# Patient Record
Sex: Male | Born: 1965 | Race: White | Hispanic: No | Marital: Single | Smoking: Current every day smoker
Health system: Southern US, Community
[De-identification: ages and names within clinical notes are randomized; demographics above are authoritative.]

## PROBLEM LIST (undated history)

## (undated) DIAGNOSIS — M1990 Unspecified osteoarthritis, unspecified site: Secondary | ICD-10-CM

## (undated) DIAGNOSIS — F32A Depression, unspecified: Secondary | ICD-10-CM

## (undated) DIAGNOSIS — J45909 Unspecified asthma, uncomplicated: Secondary | ICD-10-CM

## (undated) DIAGNOSIS — F329 Major depressive disorder, single episode, unspecified: Secondary | ICD-10-CM

## (undated) HISTORY — PX: NO PAST SURGERIES: SHX2092

## (undated) HISTORY — DX: Unspecified osteoarthritis, unspecified site: M19.90

## (undated) HISTORY — PX: CLEFT PALATE REPAIR: SUR1165

---

## 2009-10-04 ENCOUNTER — Emergency Department (HOSPITAL_COMMUNITY): Admission: EM | Admit: 2009-10-04 | Discharge: 2009-10-04 | Payer: Self-pay | Admitting: Emergency Medicine

## 2010-04-27 LAB — DIFFERENTIAL
Basophils Relative: 0 % (ref 0–1)
Eosinophils Absolute: 0 10*3/uL (ref 0.0–0.7)
Eosinophils Relative: 0 % (ref 0–5)
Lymphocytes Relative: 6 % — ABNORMAL LOW (ref 12–46)
Lymphs Abs: 1 10*3/uL (ref 0.7–4.0)
Monocytes Relative: 3 % (ref 3–12)
Neutro Abs: 14.4 10*3/uL — ABNORMAL HIGH (ref 1.7–7.7)
Neutrophils Relative %: 91 % — ABNORMAL HIGH (ref 43–77)

## 2010-04-27 LAB — CBC
HCT: 44.2 % (ref 39.0–52.0)
Hemoglobin: 15.3 g/dL (ref 13.0–17.0)
MCV: 94.1 fL (ref 78.0–100.0)
Platelets: 264 10*3/uL (ref 150–400)
RBC: 4.69 MIL/uL (ref 4.22–5.81)

## 2010-04-27 LAB — URINALYSIS, ROUTINE W REFLEX MICROSCOPIC
Bilirubin Urine: NEGATIVE
Glucose, UA: NEGATIVE mg/dL
Nitrite: NEGATIVE

## 2010-04-27 LAB — COMPREHENSIVE METABOLIC PANEL
ALT: 19 U/L (ref 0–53)
Albumin: 4.2 g/dL (ref 3.5–5.2)
BUN: 11 mg/dL (ref 6–23)
CO2: 24 mEq/L (ref 19–32)
Chloride: 113 mEq/L — ABNORMAL HIGH (ref 96–112)
Creatinine, Ser: 0.92 mg/dL (ref 0.4–1.5)
Sodium: 143 mEq/L (ref 135–145)
Total Bilirubin: 0.8 mg/dL (ref 0.3–1.2)
Total Protein: 7.1 g/dL (ref 6.0–8.3)

## 2011-04-29 ENCOUNTER — Ambulatory Visit: Payer: Self-pay | Admitting: Physician Assistant

## 2011-04-29 VITALS — BP 118/78 | HR 78 | Temp 98.4°F | Resp 20 | Ht 65.0 in | Wt 145.0 lb

## 2011-04-29 DIAGNOSIS — J329 Chronic sinusitis, unspecified: Secondary | ICD-10-CM

## 2011-04-29 DIAGNOSIS — J019 Acute sinusitis, unspecified: Secondary | ICD-10-CM

## 2011-04-29 DIAGNOSIS — J40 Bronchitis, not specified as acute or chronic: Secondary | ICD-10-CM

## 2011-04-29 DIAGNOSIS — R062 Wheezing: Secondary | ICD-10-CM

## 2011-04-29 DIAGNOSIS — J209 Acute bronchitis, unspecified: Secondary | ICD-10-CM

## 2011-04-29 MED ORDER — GUAIFENESIN ER 1200 MG PO TB12: 1.0000 | ORAL_TABLET | Freq: Two times a day (BID) | ORAL | Status: DC | PRN

## 2011-04-29 MED ORDER — CLARITHROMYCIN ER 500 MG PO TB24: 1000.0000 mg | ORAL_TABLET | Freq: Every day | ORAL | Status: AC

## 2011-04-29 MED ORDER — ALBUTEROL SULFATE HFA 108 (90 BASE) MCG/ACT IN AERS: 2.0000 | INHALATION_SPRAY | RESPIRATORY_TRACT | Status: DC | PRN

## 2011-04-29 MED ORDER — IPRATROPIUM BROMIDE 0.03 % NA SOLN: 2.0000 | Freq: Two times a day (BID) | NASAL | Status: AC

## 2011-04-29 NOTE — Patient Instructions (Signed)
Get lots of rest and drink at least 64 ounces of water daily. 

## 2011-04-29 NOTE — Progress Notes (Signed)
  Subjective:    Patient ID: Devin Chan, male    DOB: 07/13/1965, 46 y.o.   MRN: [id number]  HPI Symptoms for a week.  Nasal congestion, sinus pressure, chest pressure.  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  Lots of dust exposure.Chills, fever.  No HA, ST.  Mucinex and Benadryl with minimal relief.   Review of Systems As above.    Objective:   Physical Exam Vital signs noted. Well-developed, well nourished WM who is awake, alert and oriented, in NAD. HEENT: [place]/AT, PERRL, EOMI.  Sclera and conjunctiva are clear.  EAC are patent, TMs are normal in appearance. Nasal mucosa is pink and moist. OP is clear. Neck: supple, non-tender, no lymphadenopathey, thyromegaly. Heart: RRR, no murmur Lungs: High pitched musical wheezes throughout. Abdomen: normo-active bowel sounds, supple, non-tender, no mass or organomegaly. Extremities: no cyanosis, clubbing or edema. Skin: warm and dry without rash.     Assessment & Plan:  Sinusitis, bronchitis, wheezing.  Biaxin XL, Atrovent NS, OTC Mucinex, albuterol inhaler.  Counseled to reduce and d/c tobacco use. Rest.  Fluids.

## 2011-05-22 ENCOUNTER — Ambulatory Visit: Payer: Self-pay | Admitting: Emergency Medicine

## 2011-05-22 VITALS — BP 130/79 | HR 70 | Temp 98.3°F | Resp 16 | Ht 66.5 in | Wt 147.0 lb

## 2011-05-22 DIAGNOSIS — J329 Chronic sinusitis, unspecified: Secondary | ICD-10-CM

## 2011-05-22 DIAGNOSIS — J019 Acute sinusitis, unspecified: Secondary | ICD-10-CM

## 2011-05-22 DIAGNOSIS — R05 Cough: Secondary | ICD-10-CM

## 2011-05-22 MED ORDER — AMOXICILLIN-POT CLAVULANATE 875-125 MG PO TABS: 1.0000 | ORAL_TABLET | Freq: Two times a day (BID) | ORAL | Status: AC

## 2011-05-22 NOTE — Patient Instructions (Signed)

## 2011-05-22 NOTE — Progress Notes (Signed)
  Subjective:    Patient ID: Devin Chan, male    DOB: 1965/12/13, 46 y.o.   MRN: [id number]  HPI patient was seen here, the go and treated for bronchitis. He was treated with Biaxin. He enters today with about a two-week history of head congestion greenish nasal drainage. He has not had a cough. He has not had any fever or chills.    Review of Systems he has stopped smoking since his last visit here in     Objective:   Physical Exam  Constitutional: He appears well-developed.  HENT:  Right Ear: External ear normal.  Left Ear: External ear normal.       There is some scarring of the right TM. He has had a uvulectomy. His neck is supple. Chest was clear to auscultation and percussion          Assessment & Plan:   Assessment is acute sinusitis. He has nasal spray left ovary is going to use. We'll treat with Augmentin twice a day.

## 2012-04-15 ENCOUNTER — Ambulatory Visit: Payer: Self-pay | Admitting: Family Medicine

## 2012-04-15 VITALS — BP 144/88 | HR 74 | Temp 98.1°F | Resp 16 | Ht 65.0 in | Wt 148.0 lb

## 2012-04-15 DIAGNOSIS — L0201 Cutaneous abscess of face: Secondary | ICD-10-CM

## 2012-04-15 DIAGNOSIS — H00016 Hordeolum externum left eye, unspecified eyelid: Secondary | ICD-10-CM

## 2012-04-15 DIAGNOSIS — L03211 Cellulitis of face: Secondary | ICD-10-CM

## 2012-04-15 DIAGNOSIS — H00019 Hordeolum externum unspecified eye, unspecified eyelid: Secondary | ICD-10-CM

## 2012-04-15 MED ORDER — SULFAMETHOXAZOLE-TRIMETHOPRIM 800-160 MG PO TABS: 1.0000 | ORAL_TABLET | Freq: Two times a day (BID) | ORAL | Status: DC

## 2012-04-15 MED ORDER — SULFAMETHOXAZOLE-TMP DS 800-160 MG PO TABS
1.0000 | ORAL_TABLET | Freq: Once | ORAL | Status: AC
  Administered 2012-04-15: 1 via ORAL

## 2012-04-15 NOTE — Addendum Note (Signed)
Addended by: HOPPER, DAVID H on: 04/15/2012 02:25 PM   Modules accepted: Orders

## 2012-04-15 NOTE — Patient Instructions (Addendum)
Return if in all worse or if not improving.

## 2012-04-15 NOTE — Progress Notes (Signed)
Subjective: Patient has had a rare area coming up on his left lower eyelid for 2 or 3 days. He a lot of little splinters in his hand, and he thought he might have gotten a little with splinter in there. Yesterday he tried to squeeze it and got a little bit of pus and some pain out of it. Today it was worse and he came on in here.  Objective: Left lower lid has a stye of the inner margin, small white pustule. The lid is red, with a little edema to the creases. The itself is mildly injected. His fundi are benign. No evidence of foreign objects.  Assessment: Stye left eye lid  Plan: Using a 23-gauge needle the pustule was unroofed and cultured. A little more pus was expressed. It bled a little bit stopped on its on. Culture was taken.  Bactrim DS twice a day. Gave the first one here. Ocean him to return if in all worse

## 2012-04-18 LAB — WOUND CULTURE

## 2012-04-20 ENCOUNTER — Telehealth: Payer: Self-pay

## 2012-04-20 NOTE — Telephone Encounter (Signed)
He was due for follow up after completing antibiotics. Have called him to remind he will come in if not better.

## 2012-04-20 NOTE — Telephone Encounter (Signed)
Patient states he is returning a missed call he received regarding his eye biopsy result. Patient would like a call back @ [phone]. Thanks!

## 2012-08-28 ENCOUNTER — Ambulatory Visit: Payer: Self-pay | Admitting: Emergency Medicine

## 2012-08-28 VITALS — BP 122/82 | HR 80 | Temp 98.0°F | Resp 18 | Ht 66.5 in | Wt 154.0 lb

## 2012-08-28 DIAGNOSIS — L0291 Cutaneous abscess, unspecified: Secondary | ICD-10-CM

## 2012-08-28 DIAGNOSIS — H00016 Hordeolum externum left eye, unspecified eyelid: Secondary | ICD-10-CM

## 2012-08-28 MED ORDER — SULFAMETHOXAZOLE-TRIMETHOPRIM 800-160 MG PO TABS: 1.0000 | ORAL_TABLET | Freq: Two times a day (BID) | ORAL | Status: DC

## 2012-08-28 NOTE — Patient Instructions (Signed)
Abscess An abscess is an infected area that contains a collection of pus and debris.It can occur in almost any part of the body. An abscess is also known as a furuncle or boil. CAUSES  An abscess occurs when tissue gets infected. This can occur from blockage of oil or sweat glands, infection of hair follicles, or a minor injury to the skin. As the body tries to fight the infection, pus collects in the area and creates pressure under the skin. This pressure causes pain. People with weakened immune systems have difficulty fighting infections and get certain abscesses more often.  SYMPTOMS Usually an abscess develops on the skin and becomes a painful mass that is red, warm, and tender. If the abscess forms under the skin, you may feel a moveable soft area under the skin. Some abscesses break open (rupture) on their own, but most will continue to get worse without care. The infection can spread deeper into the body and eventually into the bloodstream, causing you to feel ill.  DIAGNOSIS  Your caregiver will take your medical history and perform a physical exam. A sample of fluid may also be taken from the abscess to determine what is causing your infection. TREATMENT  Your caregiver may prescribe antibiotic medicines to fight the infection. However, taking antibiotics alone usually does not cure an abscess. Your caregiver may need to make a small cut (incision) in the abscess to drain the pus. In some cases, gauze is packed into the abscess to reduce pain and to continue draining the area. HOME CARE INSTRUCTIONS   Only take over-the-counter or prescription medicines for pain, discomfort, or fever as directed by your caregiver.  If you were prescribed antibiotics, take them as directed. Finish them even if you start to feel better.  If gauze is used, follow your caregiver's directions for changing the gauze.  To avoid spreading the infection:  Keep your draining abscess covered with a  bandage.  Wash your hands well.  Do not share personal care items, towels, or whirlpools with others.  Avoid skin contact with others.  Keep your skin and clothes clean around the abscess.  Keep all follow-up appointments as directed by your caregiver. SEEK MEDICAL CARE IF:   You have increased pain, swelling, redness, fluid drainage, or bleeding.  You have muscle aches, chills, or a general ill feeling.  You have a fever. MAKE SURE YOU:   Understand these instructions.  Will watch your condition.  Will get help right away if you are not doing well or get worse. Document Released: 11/07/2004 Document Revised: 07/30/2011 Document Reviewed: 04/12/2011 [employer] Patient Information 2014 [employer].  

## 2012-08-28 NOTE — Progress Notes (Signed)
[facility] [address], [place] [address] [phone]  Date:  08/28/2012   Name:  Devin Chan   DOB:  [birth year]   MRN:  [id number]  PCP:  Devin Garland, MD    Chief Complaint: Neck Pain   History of Present Illness:  Devin Chan is a 47 y.o. very pleasant male patient who presents with the following:  Lesion on the back of his neck that is painful and tender.  No improvement with over the counter medications or other home remedies. Denies other complaint or health concern today.   There are no active problems to display for this patient.   Past Medical History  Diagnosis Date  . Arthritis     Past Surgical History  Procedure Laterality Date  . Cleft palate repair  age 68    [facility]    History  Substance Use Topics  . Smoking status: Former Smoker -- 1.00 packs/day for 30 years    Types: Cigarettes    Quit date: 05/01/2011  . Smokeless tobacco: Never Used     Comment: quit x 6 months at age 54  . Alcohol Use: 6.0 oz/week    12 drink(s) per week    Family History  Problem Relation Age of Onset  . Drug abuse Father   . Stroke Father   . Hypertension Father     No Known Allergies  Medication list has been reviewed and updated.  Current Outpatient Prescriptions on File Prior to Visit  Medication Sig Dispense Refill  . Multiple Vitamins-Minerals (MULTIVITAMIN WITH MINERALS) tablet Take 1 tablet by mouth daily.      . Guaifenesin (MUCINEX MAXIMUM STRENGTH) 1200 MG TB12 Take 1 tablet (1,200 mg total) by mouth every 12 (twelve) hours as needed.  14 tablet  1  . sulfamethoxazole-trimethoprim (BACTRIM DS,SEPTRA DS) 800-160 MG per tablet Take 1 tablet by mouth 2 (two) times daily.  14 tablet  0   No current facility-administered medications on file prior to visit.    Review of Systems:  As per HPI, otherwise negative.    Physical Examination: Filed Vitals:   08/28/12 1436  BP: 122/82  Pulse: 80  Temp: 98 F (36.7 C)   Resp: 18   Filed Vitals:   08/28/12 1436  Height: 5' 6.5" (1.689 m)  Weight: 154 lb (69.854 kg)   Body mass index is 24.49 kg/(m^2). Ideal Body Weight: Weight in (lb) to have BMI = 25: 156.9   GEN: WDWN, NAD, Non-toxic, Alert & Oriented x 3 HEENT: Atraumatic, Normocephalic.  Ears and Nose: No external deformity. EXTR: No clubbing/cyanosis/edema NEURO: Normal gait.  PSYCH: Normally interactive. Conversant. Not depressed or anxious appearing.  Calm demeanor.  Skin:  Abscess neck.  Assessment and Plan: Abscess Septra Local heat   Signed,  Phillips Odor, MD

## 2012-11-10 ENCOUNTER — Ambulatory Visit: Payer: Self-pay | Admitting: Family Medicine

## 2012-11-10 VITALS — BP 100/70 | HR 68 | Temp 97.7°F | Resp 16 | Ht 66.0 in | Wt 158.0 lb

## 2012-11-10 DIAGNOSIS — J329 Chronic sinusitis, unspecified: Secondary | ICD-10-CM

## 2012-11-10 MED ORDER — AZITHROMYCIN 250 MG PO TABS: ORAL_TABLET | ORAL | Status: DC

## 2012-11-10 NOTE — Progress Notes (Signed)
  Subjective:    Patient ID: Christy Gentles, male    DOB: Sep 30, 1965, 47 y.o.   MRN: [id number]  HPI 47 yo male with complaint of sinus congestion and pressure with thick nasal discharge.  Onset approximately two weeks ago, improved mildly, the worsened.  History or similar sinus infections in past.  Has not taken anything since onset to alleviate symptoms.  Pain/pressure worse when leaning forward.  Positive post nasal drip as well. Past Medical History  Diagnosis Date  . Arthritis    Past Surgical History  Procedure Laterality Date  . 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  HENT: Positive for congestion, rhinorrhea and postnasal drip.   Eyes: Negative for discharge and redness.  Respiratory: Negative for cough, shortness of breath and wheezing.   Cardiovascular: Negative for chest pain.  Neurological: Positive for headaches. Negative for weakness and numbness.       Objective:   Physical Exam Blood pressure 100/70, pulse 68, temperature 97.7 F (36.5 C), temperature source Oral, resp. rate 16, height 5\' 6"  (1.676 m), weight 158 lb (71.668 kg), SpO2 99.00%. Body mass index is 25.51 kg/(m^2). Well-developed, well nourished male who is awake, alert and oriented, in NAD. HEENT: [place]/AT, PERRL, EOMI.  Sclera and conjunctiva are clear.  EAC are patent, TMs are normal in appearance. Nasal mucosa is edematoust. OP is with mild cobblestoning, mild maxillary sinus tenderness. Neck: supple, non-tender, no lymphadenopathy, thyromegaly. Heart: RRR, no murmur Lungs: normal effort, CTA Abdomen: normo-active bowel sounds, supple, non-tender, no mass or organomegaly. Extremities: no cyanosis, clubbing or edema. Skin: warm and dry without rash. Psychologic: good mood and appropriate affect, normal speech and behavior.      Assessment & Plan:  Sinusitis - approximately 14 days in duration, will treat  with Z-pack and OTC decongestants.  Return if symptoms worsen.

## 2012-11-10 NOTE — Patient Instructions (Signed)

## 2013-12-29 ENCOUNTER — Ambulatory Visit (INDEPENDENT_AMBULATORY_CARE_PROVIDER_SITE_OTHER): Payer: Self-pay

## 2013-12-29 ENCOUNTER — Ambulatory Visit (INDEPENDENT_AMBULATORY_CARE_PROVIDER_SITE_OTHER): Payer: Self-pay | Admitting: Family Medicine

## 2013-12-29 VITALS — BP 166/96 | HR 82 | Temp 97.9°F | Resp 16 | Ht 65.75 in | Wt 155.2 lb

## 2013-12-29 DIAGNOSIS — S62609B Fracture of unspecified phalanx of unspecified finger, initial encounter for open fracture: Secondary | ICD-10-CM

## 2013-12-29 DIAGNOSIS — M79645 Pain in left finger(s): Secondary | ICD-10-CM

## 2013-12-29 DIAGNOSIS — S62607B Fracture of unspecified phalanx of left little finger, initial encounter for open fracture: Secondary | ICD-10-CM

## 2013-12-29 MED ORDER — CEPHALEXIN 500 MG PO CAPS: 500.0000 mg | ORAL_CAPSULE | Freq: Three times a day (TID) | ORAL | Status: DC

## 2013-12-29 NOTE — Progress Notes (Signed)
Patient ID: Devin Chan MRN: [id number], DOB: September 13, 1965, 48 y.o. Date of Encounter: 12/29/2013, 6:00 PM  This chart was scribed for Dr. Elvina SidleKurt Margit Batte, MD by Devin Chan, Medical Scribe. This patient was seen in Room 12 and the patient's care was started at 5:45 PM.  Primary Physician: Devin GarlandMAYANS,LAURA, MD  Chief Complaint:  Chief Complaint  Patient presents with  . Hand Pain    Left pinky finger, X 6-7 days ago     HPI: 48 y.o. year old male with history below presents with left pinky finger pain for 6-7 days. Pt states that he crushed the finger while working. Pt notes some bruising and swelling to the finger close to the nail bed. He also notes that it feels warm to the touch. Pt states he continued working after the accident. He states that he has been icing it with increased throbbing pain. Has also soaked the finger in warm water with minimal relief. He reports he does not have complete full ROM. He denies any fever, chills, nausea or vomiting.   Pt is self employed and works with [employer]  Past Medical History  Diagnosis Date  . Arthritis      Home Meds: Prior to Admission medications   Medication Sig Start Date End Date Taking? Authorizing Provider  aspirin 81 MG tablet Take 81 mg by mouth daily.   Yes Historical Provider, MD  azithromycin (ZITHROMAX) 250 MG tablet Take 2 tabs PO x 1 dose, then 1 tab PO QD x 4 days 11/10/12   Tarry Kosodd M McGrath, MD  Guaifenesin [facility](MUCINEX MAXIMUM STRENGTH) 1200 MG TB12 Take 1 tablet (1,200 mg total) by mouth every 12 (twelve) hours as needed. 04/29/11   [name], PA-C  Multiple Vitamins-Minerals (MULTIVITAMIN WITH MINERALS) tablet Take 1 tablet by mouth daily.   Yes Historical Provider, MD  sulfamethoxazole-trimethoprim (BACTRIM DS,SEPTRA DS) 800-160 MG per tablet Take 1 tablet by mouth 2 (two) times daily. 08/28/12   Devin DaneJeffery S Anderson, MD    Allergies: No Known Allergies  History   Social History  . Marital Status:  Single    Spouse Name: N/A    Number of Children: N/A  . Years of Education: N/A   Occupational History  . Not on file.   Social History Main Topics  . Smoking status: Former Smoker -- 1.00 packs/day for 30 years    Types: Cigarettes    Quit date: 05/01/2011  . Smokeless tobacco: Never Used     Comment: quit x 6 months at age 48  . Alcohol Use: 6.0 oz/week    12 drink(s) per week  . Drug Use: Yes    Special: Marijuana  . Sexual Activity: Yes   Other Topics Concern  . Not on file   Social History Narrative     Review of Systems: Constitutional: negative for chills, fever, night sweats, weight changes, or fatigue  HEENT: negative for vision changes, hearing loss, congestion, rhinorrhea, ST, epistaxis, or sinus pressure Cardiovascular: negative for chest pain or palpitations Respiratory: negative for hemoptysis, wheezing, shortness of breath, or cough Abdominal: negative for abdominal pain, nausea, vomiting, diarrhea, or constipation Dermatological: negative for rash. Positive for bruising and warmth to left pinky finger Neurologic: negative for headache, dizziness, or syncope All other systems reviewed and are otherwise negative with the exception to those above and in the HPI.   Physical Exam: Blood pressure 166/96, pulse 82, temperature 97.9 F (36.6 C), temperature source Oral, resp. rate 16, height 5' 5.75" (  1.67 m), weight 155 lb 3.2 oz (70.398 kg), SpO2 99 %., Body mass index is 25.24 kg/(m^2). General: Well developed, well nourished, in no acute distress. Head: Normocephalic, atraumatic, eyes without discharge, sclera non-icteric, nares are without discharge. Bilateral auditory canals clear, TM's are without perforation, pearly grey and translucent with reflective cone of light bilaterally. Oral cavity moist, posterior pharynx without exudate, erythema, peritonsillar abscess, or post nasal drip.  Neck: Supple. No thyromegaly. Full ROM. No lymphadenopathy. Lungs: Clear  bilaterally to auscultation without wheezes, rales, or rhonchi. Breathing is unlabored. Heart: RRR with S1 S2. No murmurs, rubs, or gallops appreciated. Abdomen: Soft, non-tender, non-distended with normoactive bowel sounds. No hepatomegaly. No rebound/guarding. No obvious abdominal masses. Msk:  Strength and tone normal for age. Disruption of the cuticle of left 5th finger with palmar angulation. Inability to flex and fully extend Extremities/Skin: Warm and dry. No clubbing or cyanosis. No edema. No rashes or suspicious lesions. Neuro: Alert and oriented X 3. Moves all extremities spontaneously. Gait is normal. CNII-XII grossly in tact. Psych:  Responds to questions appropriately with a normal affect.   Labs:  Imaging: [facility] reading (PRIMARY) by  Dr. Milus GlazierLauenstein. Positive for displaced distal fragment of left 5th distal phalanx   ASSESSMENT AND PLAN:  [age] y.o. year old male with   1. Finger pain, left   2. Finger fracture, left, open, initial encounter    Meds ordered this encounter  Medications  . cephALEXin (KEFLEX) 500 MG capsule    Sig: Take 1 capsule (500 mg total) by mouth 3 (three) times daily.    Dispense:  21 capsule    Refill:  0   Orthopedic referral Soft splint fashioned with buddy taping  I personally performed the services described in this documentation, which was scribed in my presence. The recorded information has been reviewed and is accurate.  Signed, [name], MD 12/29/2013 6:00 PM

## 2013-12-29 NOTE — Patient Instructions (Signed)
We will refer you to a specialist tomorrow

## 2014-01-02 ENCOUNTER — Inpatient Hospital Stay (HOSPITAL_COMMUNITY)
Admission: EM | Admit: 2014-01-02 | Discharge: 2014-01-03 | DRG: 370 | Disposition: A | Discharge status: Home / Self Care | Payer: Self-pay | Attending: Internal Medicine | Admitting: Internal Medicine

## 2014-01-02 ENCOUNTER — Emergency Department (HOSPITAL_COMMUNITY): Payer: Self-pay

## 2014-01-02 ENCOUNTER — Encounter (HOSPITAL_COMMUNITY): Payer: Self-pay | Admitting: Emergency Medicine

## 2014-01-02 DIAGNOSIS — S62637D Displaced fracture of distal phalanx of left little finger, subsequent encounter for fracture with routine healing: Secondary | ICD-10-CM

## 2014-01-02 DIAGNOSIS — K2970 Gastritis, unspecified, without bleeding: Secondary | ICD-10-CM | POA: Diagnosis present

## 2014-01-02 DIAGNOSIS — F1010 Alcohol abuse, uncomplicated: Secondary | ICD-10-CM

## 2014-01-02 DIAGNOSIS — F1290 Cannabis use, unspecified, uncomplicated: Secondary | ICD-10-CM | POA: Diagnosis present

## 2014-01-02 DIAGNOSIS — K920 Hematemesis: Secondary | ICD-10-CM | POA: Diagnosis present

## 2014-01-02 DIAGNOSIS — Z87891 Personal history of nicotine dependence: Secondary | ICD-10-CM

## 2014-01-02 DIAGNOSIS — K210 Gastro-esophageal reflux disease with esophagitis: Secondary | ICD-10-CM | POA: Diagnosis present

## 2014-01-02 DIAGNOSIS — F1020 Alcohol dependence, uncomplicated: Secondary | ICD-10-CM | POA: Diagnosis present

## 2014-01-02 DIAGNOSIS — K922 Gastrointestinal hemorrhage, unspecified: Secondary | ICD-10-CM

## 2014-01-02 DIAGNOSIS — R1013 Epigastric pain: Secondary | ICD-10-CM | POA: Insufficient documentation

## 2014-01-02 DIAGNOSIS — K226 Gastro-esophageal laceration-hemorrhage syndrome: Principal | ICD-10-CM | POA: Diagnosis present

## 2014-01-02 HISTORY — DX: Major depressive disorder, single episode, unspecified: F32.9

## 2014-01-02 HISTORY — DX: Unspecified asthma, uncomplicated: J45.909

## 2014-01-02 HISTORY — DX: Depression, unspecified: F32.A

## 2014-01-02 LAB — COMPREHENSIVE METABOLIC PANEL
ALT: 27 U/L (ref 0–53)
ANION GAP: 22 — AB (ref 5–15)
AST: 27 U/L (ref 0–37)
Albumin: 4.5 g/dL (ref 3.5–5.2)
Alkaline Phosphatase: 55 U/L (ref 39–117)
BILIRUBIN TOTAL: 0.6 mg/dL (ref 0.3–1.2)
BUN: 13 mg/dL (ref 6–23)
CALCIUM: 9.2 mg/dL (ref 8.4–10.5)
CO2: 19 mEq/L (ref 19–32)
Chloride: 95 mEq/L — ABNORMAL LOW (ref 96–112)
Creatinine, Ser: 1 mg/dL (ref 0.50–1.35)
GFR calc Af Amer: >90 mL/min (ref >90)
GFR calc non Af Amer: 87 mL/min — ABNORMAL LOW (ref >90)
Glucose, Bld: 107 mg/dL — ABNORMAL HIGH (ref 70–99)
Potassium: 4.4 mEq/L (ref 3.7–5.3)
Sodium: 136 mEq/L — ABNORMAL LOW (ref 137–147)
TOTAL PROTEIN: 8 g/dL (ref 6.0–8.3)

## 2014-01-02 LAB — TYPE AND SCREEN
ABO/RH(D): O NEG
Antibody Screen: NEGATIVE

## 2014-01-02 LAB — RAPID URINE DRUG SCREEN, HOSP PERFORMED
Amphetamines: NOT DETECTED
BARBITURATES: NOT DETECTED
Benzodiazepines: NOT DETECTED
Cocaine: NOT DETECTED
Opiates: POSITIVE — AB
Tetrahydrocannabinol: POSITIVE — AB

## 2014-01-02 LAB — CBC
HEMATOCRIT: 41 % (ref 39.0–52.0)
Hemoglobin: 14 g/dL (ref 13.0–17.0)
MCH: 30.1 pg (ref 26.0–34.0)
MCHC: 34.1 g/dL (ref 30.0–36.0)
MCV: 88.2 fL (ref 78.0–100.0)
PLATELETS: 303 10*3/uL (ref 150–400)
RBC: 4.65 MIL/uL (ref 4.22–5.81)
RDW: 13.1 % (ref 11.5–15.5)
WBC: 10.5 10*3/uL (ref 4.0–10.5)

## 2014-01-02 LAB — CBC WITH DIFFERENTIAL/PLATELET
Basophils Absolute: 0 10*3/uL (ref 0.0–0.1)
Basophils Relative: 0 % (ref 0–1)
EOS ABS: 0 10*3/uL (ref 0.0–0.7)
EOS PCT: 0 % (ref 0–5)
HEMATOCRIT: 45.7 % (ref 39.0–52.0)
Hemoglobin: 16 g/dL (ref 13.0–17.0)
LYMPHS PCT: 6 % — AB (ref 12–46)
Lymphs Abs: 0.8 10*3/uL (ref 0.7–4.0)
MCH: 31.4 pg (ref 26.0–34.0)
MCHC: 35 g/dL (ref 30.0–36.0)
MCV: 89.8 fL (ref 78.0–100.0)
MONO ABS: 0.3 10*3/uL (ref 0.1–1.0)
Monocytes Relative: 2 % — ABNORMAL LOW (ref 3–12)
NEUTROS ABS: 12.7 10*3/uL — AB (ref 1.7–7.7)
Neutrophils Relative %: 92 % — ABNORMAL HIGH (ref 43–77)
PLATELETS: 348 10*3/uL (ref 150–400)
RBC: 5.09 MIL/uL (ref 4.22–5.81)
RDW: 12.9 % (ref 11.5–15.5)
WBC: 13.8 10*3/uL — AB (ref 4.0–10.5)

## 2014-01-02 LAB — PROTIME-INR
INR: 1.08 (ref 0.00–1.49)
Prothrombin Time: 14.1 seconds (ref 11.6–15.2)

## 2014-01-02 LAB — OCCULT BLOOD X 1 CARD TO LAB, STOOL: FECAL OCCULT BLD: NEGATIVE

## 2014-01-02 LAB — LIPASE, BLOOD: LIPASE: 24 U/L (ref 11–59)

## 2014-01-02 LAB — ABO/RH: ABO/RH(D): O NEG

## 2014-01-02 MED ORDER — ONDANSETRON HCL 4 MG/2ML IJ SOLN: 4.0000 mg | Freq: Four times a day (QID) | INTRAMUSCULAR | Status: DC | PRN

## 2014-01-02 MED ORDER — ONDANSETRON HCL 4 MG/2ML IJ SOLN
4.0000 mg | Freq: Once | INTRAMUSCULAR | Status: AC
  Administered 2014-01-02: 4 mg via INTRAVENOUS
  Filled 2014-01-02: qty 2

## 2014-01-02 MED ORDER — CEPHALEXIN 500 MG PO CAPS
500.0000 mg | ORAL_CAPSULE | Freq: Three times a day (TID) | ORAL | Status: DC
  Administered 2014-01-02 – 2014-01-03 (×4): 500 mg via ORAL
  Filled 2014-01-02 (×5): qty 1

## 2014-01-02 MED ORDER — SUCRALFATE 1 GM/10ML PO SUSP
1.0000 g | Freq: Four times a day (QID) | ORAL | Status: DC
  Administered 2014-01-02 – 2014-01-03 (×3): 1 g via ORAL
  Filled 2014-01-02 (×7): qty 10

## 2014-01-02 MED ORDER — ONDANSETRON HCL 4 MG PO TABS: 4.0000 mg | ORAL_TABLET | Freq: Four times a day (QID) | ORAL | Status: DC | PRN

## 2014-01-02 MED ORDER — LORAZEPAM 2 MG/ML IJ SOLN: 1.0000 mg | Freq: Four times a day (QID) | INTRAMUSCULAR | Status: DC | PRN

## 2014-01-02 MED ORDER — LORAZEPAM 1 MG PO TABS: 1.0000 mg | ORAL_TABLET | Freq: Four times a day (QID) | ORAL | Status: DC | PRN

## 2014-01-02 MED ORDER — HYDROMORPHONE HCL 1 MG/ML IJ SOLN
1.0000 mg | Freq: Once | INTRAMUSCULAR | Status: AC
  Administered 2014-01-02: 1 mg via INTRAVENOUS
  Filled 2014-01-02: qty 1

## 2014-01-02 MED ORDER — VITAMIN B-1 100 MG PO TABS
100.0000 mg | ORAL_TABLET | Freq: Every day | ORAL | Status: DC
  Administered 2014-01-02 – 2014-01-03 (×2): 100 mg via ORAL
  Filled 2014-01-02 (×2): qty 1

## 2014-01-02 MED ORDER — SUCRALFATE 1 G PO TABS
1.0000 g | ORAL_TABLET | Freq: Once | ORAL | Status: AC
  Administered 2014-01-02: 1 g via ORAL
  Filled 2014-01-02: qty 1

## 2014-01-02 MED ORDER — MORPHINE SULFATE 4 MG/ML IJ SOLN
4.0000 mg | Freq: Once | INTRAMUSCULAR | Status: AC
  Administered 2014-01-02: 4 mg via INTRAVENOUS
  Filled 2014-01-02: qty 1

## 2014-01-02 MED ORDER — SODIUM CHLORIDE 0.9 % IV SOLN
80.0000 mg | Freq: Once | INTRAVENOUS | Status: AC
  Administered 2014-01-02: 80 mg via INTRAVENOUS
  Filled 2014-01-02 (×2): qty 80

## 2014-01-02 MED ORDER — FOLIC ACID 1 MG PO TABS
1.0000 mg | ORAL_TABLET | Freq: Every day | ORAL | Status: DC
  Administered 2014-01-02 – 2014-01-03 (×2): 1 mg via ORAL
  Filled 2014-01-02 (×2): qty 1

## 2014-01-02 MED ORDER — SODIUM CHLORIDE 0.9 % IV SOLN
8.0000 mg/h | INTRAVENOUS | Status: DC
  Administered 2014-01-02 – 2014-01-03 (×2): 8 mg/h via INTRAVENOUS
  Filled 2014-01-02 (×7): qty 80

## 2014-01-02 MED ORDER — THIAMINE HCL 100 MG/ML IJ SOLN
100.0000 mg | Freq: Every day | INTRAMUSCULAR | Status: DC
  Filled 2014-01-02 (×2): qty 1

## 2014-01-02 MED ORDER — SODIUM CHLORIDE 0.9 % IV BOLUS (SEPSIS)
1000.0000 mL | Freq: Once | INTRAVENOUS | Status: AC
  Administered 2014-01-02: 1000 mL via INTRAVENOUS

## 2014-01-02 MED ORDER — SODIUM CHLORIDE 0.9 % IV SOLN
INTRAVENOUS | Status: AC
Start: 2014-01-02 — End: 2014-01-03
  Administered 2014-01-02 – 2014-01-03 (×2): via INTRAVENOUS

## 2014-01-02 MED ORDER — ADULT MULTIVITAMIN W/MINERALS CH
1.0000 | ORAL_TABLET | Freq: Every day | ORAL | Status: DC
  Administered 2014-01-02 – 2014-01-03 (×2): 1 via ORAL
  Filled 2014-01-02 (×2): qty 1

## 2014-01-02 NOTE — ED Provider Notes (Addendum)
CSN: [id number]     Arrival date & time 01/02/14  0919 History   First MD Initiated Contact with Patient 01/02/14 [phone]     Chief Complaint  Patient presents with  . Abdominal Pain     (Consider location/radiation/quality/duration/timing/severity/associated sxs/prior Treatment) HPI  Pt presenting with c/o abdominal pain and vomiting.  Pt states he vomited multiple times bright red blood.  No melena or blood in stools.  C/o epigastric abdominal pain.  Pt endorses drinking etoh last night.  No fever/chills.  No hx of prior GI bleed. Pain is aching and sharp.  He has not had any treatment prior to arrival.  There are no other associated systemic symptoms, there are no other alleviating or modifying factors.   Past Medical History  Diagnosis Date  . Arthritis   . Asthma   . Depression    Past Surgical History  Procedure Laterality Date  . 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 No past surgeries     Family History  Problem Relation Age of Onset  . Drug abuse Father   . Stroke Father   . Hypertension Father    History  Substance Use Topics  . Smoking status: Former Smoker -- 1.00 packs/day for 30 years    Types: Cigarettes    Quit date: 05/01/2011  . Smokeless tobacco: Never Used     Comment: quit x 6 months at age 48  . Alcohol Use: 16.2 oz/week    12 Not specified, 15 Cans of beer per week    Review of Systems  ROS reviewed and all otherwise negative except for mentioned in HPI    Allergies  Review of patient's allergies indicates no known allergies.  Home Medications   Prior to Admission medications   Medication Sig Start Date End Date Taking? Authorizing Provider  aspirin 81 MG tablet Take 81 mg by mouth daily.   Yes Historical Provider, MD  cephALEXin (KEFLEX) 500 MG capsule Take 1 capsule (500 mg total) by mouth 3 (three) times daily. 12/29/13  Yes Elvina SidleKurt Lauenstein, MD  pantoprazole (PROTONIX) 40 MG tablet Take 1 tablet (40 mg total) by mouth daily. 01/03/14    Tasrif Ahmed, MD   BP 124/71 mmHg  Pulse 75  Temp(Src) 98.8 F (37.1 C) (Oral)  Resp 18  Ht 5\' 6"  (1.676 m)  Wt 154 lb 4.8 oz (69.99 kg)  BMI 24.92 kg/m2  SpO2 96%  Vitals reviewed Physical Exam  Physical Examination: General appearance - alert, well appearing, and in no distress Mental status - alert, oriented to person, place, and time Eyes - no conjunctival injection, no scleral icterus Mouth - mucous membranes moist, pharynx normal without lesions Chest - clear to auscultation, no wheezes, rales or rhonchi, symmetric air entry Heart - normal rate, regular rhythm, normal S1, S2, no murmurs, rubs, clicks or gallops Abdomen - soft, ttp in epigastric region, no gaurding or rebound, nondistended, no masses or organomegaly, nabs Extremities - peripheral pulses normal, no pedal edema, no clubbing or cyanosis Skin - normal coloration and turgor, no rashes  ED Course  Procedures (including critical care time)  2:30 PM d/w Dr. Juanda ChanceBrodie, GI she agrees with PPI and admission to medical service  2:36 PM d/w internal medicine service- pt to be admitted to med/surg bed.   CRITICAL CARE Performed by: Ethelda ChickLINKER,MARTHA K Total critical care time: 35 Critical care time was exclusive of separately billable procedures and treating other patients. Critical care was necessary to treat  or prevent imminent or life-threatening deterioration. Critical care was time spent personally by me on the following activities: development of treatment plan with patient and/or surrogate as well as nursing, discussions with consultants, evaluation of patient's response to treatment, examination of patient, obtaining history from patient or surrogate, ordering and performing treatments and interventions, ordering and review of laboratory studies, ordering and review of radiographic studies, pulse oximetry and re-evaluation of patient's condition. Labs Review Labs Reviewed  CBC WITH DIFFERENTIAL - Abnormal; Notable for  the following:    WBC 13.8 (*)    Neutrophils Relative % 92 (*)    Neutro Abs 12.7 (*)    Lymphocytes Relative 6 (*)    Monocytes Relative 2 (*)    All other components within normal limits  COMPREHENSIVE METABOLIC PANEL - Abnormal; Notable for the following:    Sodium 136 (*)    Chloride 95 (*)    Glucose, Bld 107 (*)    GFR calc non Af Amer 87 (*)    Anion gap 22 (*)    All other components within normal limits  URINE RAPID DRUG SCREEN (HOSP PERFORMED) - Abnormal; Notable for the following:    Opiates POSITIVE (*)    Tetrahydrocannabinol POSITIVE (*)    All other components within normal limits  BASIC METABOLIC PANEL - Abnormal; Notable for the following:    Calcium 8.2 (*)    GFR calc non Af Amer 76 (*)    GFR calc Af Amer 88 (*)    All other components within normal limits  LIPASE, BLOOD  PROTIME-INR  OCCULT BLOOD X 1 CARD TO LAB, STOOL  CBC  CBC  HIV ANTIBODY (ROUTINE TESTING)  HEPATITIS PANEL, ACUTE  TYPE AND SCREEN  ABO/RH    Imaging Review Dg Abd Acute W/chest  01/02/2014   CLINICAL DATA:  Vomiting blood after drinking alcohol loss night. Abdominal pain in the middle of abdomen.  EXAM: ACUTE ABDOMEN SERIES (ABDOMEN 2 VIEW & CHEST 1 VIEW)  COMPARISON:  CT 10/04/2009  FINDINGS: There is no evidence of dilated bowel loops or free intraperitoneal air. No radiopaque calculi or other significant radiographic abnormality is seen. Heart size and mediastinal contours are within normal limits. Both lungs are clear.  IMPRESSION: Negative abdominal radiographs.  No acute cardiopulmonary disease.   Electronically Signed   By: Charlett NoseKevin  Dover M.D.   On: 01/02/2014 11:55     EKG Interpretation   Date/Time:  Sunday January 02 2014 09:56:21 EST Ventricular Rate:  95 PR Interval:  139 QRS Duration: 88 QT Interval:  365 QTC Calculation: 459 R Axis:   74 Text Interpretation:  Sinus rhythm ST elev, probable normal early repol  pattern ED PHYSICIAN INTERPRETATION AVAILABLE IN [facility] Confirmed  by TEST, Record ([id number]) on 01/04/2014 8:07:37 AM      MDM   Final diagnoses:  Upper GI bleed  Epigastric pain  epigastric pain, upper gi bleed  Pt presenting with epigastric pain and vomiting red blood, no vomiting in the ED. Acute abd series shows no free air to suggest perforation.  Pt has not had further vomiting in the ED after meds and his pain is under control.  D/w GI, Dr. Juanda ChanceBrodie who recommends admission to medical service- pt is on PPI, add carafate.      Ethelda ChickMartha K Linker, MD 01/02/14 1652  Ethelda ChickMartha K Linker, MD 01/04/14 [phone]

## 2014-01-02 NOTE — Plan of Care (Signed)
Problem: Phase II Progression Outcomes Goal: Hemodynamically stable Outcome: Progressing     

## 2014-01-02 NOTE — ED Notes (Signed)
Pt c/o upper abdominal pain onset last night. Pt has history of food poison.

## 2014-01-02 NOTE — H&P (Signed)
Date: 01/02/2014               Patient Name:  Devin Chan MRN: [id number]  DOB: 09/18/1965 Age / Sex: 48 y.o., male   PCP: Lamar LaundryLaura C Mayans, MD         Medical Service: Internal Medicine Teaching Service         Attending Physician: Dr. Farley LyJerry Dale Joines, MD    First Contact: Dr. Tasia CatchingsAhmed Pager: [phone]  Second Contact: Dr. Yetta BarreJones Pager: [phone]       After Hours (After 5p/  First Contact Pager: [phone]  weekends / holidays): Second Contact Pager: [phone]   Chief Complaint: hemoptysis   History of Present Illness:   48 yo male with hx of heavy alcohol drinking comes in with hemoptysis since last night. Was at a concert and drank some "hard mixed drinks". Came back to friend's house and his stomach started to hurt and starting to vomit. First it was just the stomach content then as he kept vomiting he started to have blood mixed with the emesis. It was about ~1 tablet spoon quantity each time, he vomitted for 6-7 times. He woke up every hour to vomit. He had forceful vomiting. He usually takes tums for occasional heart burn, his stomach pain is similar to heart burn, couldn't take any tums as friend didn't have any. He drinks 2-3 beers every night. Used to smoke for 30 years x2 ppd, quit 2 years ago. Occasional marijuana use.   He denies any trouble breathing, any cough, any chest pain. No diarrhea, fever, chills. No stool color changes or bleeding. BM normal, last yesterday. Never had EGD in the past. Was taking keflex for broken finger (has 6 more days left).   No acute change on CXR. Neg abdomen.   Meds: Current Facility-Administered Medications  Medication Dose Route Frequency Provider Last Rate Last Dose  . 0.9 %  sodium chloride infusion   Intravenous Continuous Courtney ParisEden W Jones, MD 125 mL/hr at 01/02/14 1600    . cephALEXin (KEFLEX) capsule 500 mg  500 mg Oral TID Courtney ParisEden W Jones, MD   500 mg at 01/02/14 1703  . folic acid (FOLVITE) tablet 1 mg  1 mg Oral Daily Courtney ParisEden W Jones, MD    1 mg at 01/02/14 1703  . LORazepam (ATIVAN) tablet 1 mg  1 mg Oral Q6H PRN Courtney ParisEden W Jones, MD       Or  . LORazepam (ATIVAN) injection 1 mg  1 mg Intravenous Q6H PRN Courtney ParisEden W Jones, MD      . multivitamin with minerals tablet 1 tablet  1 tablet Oral Daily Courtney ParisEden W Jones, MD   1 tablet at 01/02/14 1704  . ondansetron (ZOFRAN) tablet 4 mg  4 mg Oral Q6H PRN Courtney ParisEden W Jones, MD       Or  . ondansetron [employer](ZOFRAN) injection 4 mg  4 mg Intravenous Q6H PRN Courtney ParisEden W Jones, MD      . pantoprazole (PROTONIX) 80 mg in sodium chloride 0.9 % 250 mL (0.32 mg/mL) infusion  8 mg/hr Intravenous Continuous Ethelda ChickMartha K Linker, MD 25 mL/hr at 01/02/14 1140 8 mg/hr at 01/02/14 1140  . sucralfate (CARAFATE) 1 GM/10ML suspension 1 g  1 g Oral 4 times per day Courtney ParisEden W Jones, MD      . thiamine (VITAMIN B-1) tablet 100 mg  100 mg Oral Daily Courtney ParisEden W Jones, MD   100 mg at 01/02/14 1704   Or  . thiamine (B-1) injection  100 mg  100 mg Intravenous Daily Courtney Paris, MD        Allergies: Allergies as of 01/02/2014  . (No Known Allergies)   Past Medical History  Diagnosis Date  . Arthritis   . Asthma   . Depression    Past Surgical History  Procedure Laterality Date  . Cleft palate repair  age 89    [facility]  . No past surgeries     Family History  Problem Relation Age of Onset  . Drug abuse Father   . Stroke Father   . Hypertension Father    History   Social History  . Marital Status: Single    Spouse Name: N/A    Number of Children: N/A  . Years of Education: N/A   Occupational History  . Not on file.   Social History Main Topics  . Smoking status: Former Smoker -- 1.00 packs/day for 30 years    Types: Cigarettes    Quit date: 05/01/2011  . Smokeless tobacco: Never Used     Comment: quit x 6 months at age 46  . Alcohol Use: 16.2 oz/week    12 Not specified, 15 Cans of beer per week  . Drug Use: Yes    Special: Marijuana  . Sexual Activity: Yes   Other Topics Concern  . Not on file   Social History  Narrative    Review of Systems: Review of Systems  Constitutional: Negative for fever, chills and diaphoresis.  HENT: Negative for congestion, hearing loss, sore throat and tinnitus.   Eyes: Negative.   Respiratory: Positive for hemoptysis. Negative for cough, sputum production, shortness of breath and wheezing.   Cardiovascular: Negative for chest pain, palpitations, leg swelling and PND.  Gastrointestinal: Positive for heartburn, nausea and vomiting. Negative for abdominal pain, diarrhea, constipation, blood in stool and melena.  Genitourinary: Negative for dysuria, urgency, frequency, hematuria and flank pain.  Musculoskeletal: Negative.   Skin: Negative.   Neurological: Negative for dizziness, tingling, tremors, sensory change, speech change, focal weakness, seizures, loss of consciousness and weakness.  Endo/Heme/Allergies: Negative.   Psychiatric/Behavioral: Negative.      Physical Exam: Blood pressure 140/86, pulse 96, temperature 98.4 F (36.9 C), temperature source Oral, resp. rate 18, height 5\' 6"  (1.676 m), weight 69.99 kg (154 lb 4.8 oz), SpO2 100 %. Physical Exam  Constitutional: He is oriented to person, place, and time. He appears well-developed and well-nourished. No distress.  HENT:  Head: Normocephalic and atraumatic.  Right Ear: External ear normal.  Left Ear: External ear normal.  Nose: Nose normal.  Mouth/Throat: Oropharynx is clear and moist.  Eyes: Conjunctivae and EOM are normal. Pupils are equal, round, and reactive to light. Right eye exhibits no discharge. Left eye exhibits no discharge. No scleral icterus.  Neck: Normal range of motion. Neck supple. No JVD present. No tracheal deviation present. No thyromegaly present.  No crepitus noted on neck.   Cardiovascular: Normal rate, regular rhythm, normal heart sounds and intact distal pulses.  Exam reveals no gallop and no friction rub.   No murmur heard. Respiratory: Effort normal and breath sounds normal.  No respiratory distress. He has no wheezes. He exhibits no tenderness.  No TTP on chest. No crepitus.  Breathing comfortably.   GI: Soft. Bowel sounds are normal. He exhibits no distension and no mass. There is no tenderness. There is no rebound and no guarding.  Musculoskeletal: Normal range of motion. He exhibits no edema or tenderness.  Lymphadenopathy:  He has no cervical adenopathy.  Neurological: He is alert and oriented to person, place, and time. He has normal reflexes. He displays normal reflexes. No cranial nerve deficit. He exhibits normal muscle tone. Coordination normal.  Skin: Skin is warm. He is not diaphoretic.  Psychiatric: He has a normal mood and affect.     Lab results: Basic Metabolic Panel:  Recent Labs  40/98/1111/22/15 0940  NA 136*  K 4.4  CL 95*  CO2 19  GLUCOSE 107*  BUN 13  CREATININE 1.00  CALCIUM 9.2   Liver Function Tests:  Recent Labs  01/02/14 0940  AST 27  ALT 27  ALKPHOS 55  BILITOT 0.6  PROT 8.0  ALBUMIN 4.5    Recent Labs  01/02/14 0940  LIPASE 24   No results for input(s): AMMONIA in the last 72 hours. CBC:  Recent Labs  01/02/14 0940  WBC 13.8*  NEUTROABS 12.7*  HGB 16.0  HCT 45.7  MCV 89.8  PLT 348   Cardiac Enzymes: No results for input(s): CKTOTAL, CKMB, CKMBINDEX, TROPONINI in the last 72 hours. BNP: No results for input(s): PROBNP in the last 72 hours. D-Dimer: No results for input(s): DDIMER in the last 72 hours. CBG: No results for input(s): GLUCAP in the last 72 hours. Hemoglobin A1C: No results for input(s): HGBA1C in the last 72 hours. Fasting Lipid Panel: No results for input(s): CHOL, HDL, LDLCALC, TRIG, CHOLHDL, LDLDIRECT in the last 72 hours. Thyroid Function Tests: No results for input(s): TSH, T4TOTAL, FREET4, T3FREE, THYROIDAB in the last 72 hours. Anemia Panel: No results for input(s): VITAMINB12, FOLATE, FERRITIN, TIBC, IRON, RETICCTPCT in the last 72 hours. Coagulation:  Recent Labs   01/02/14 1032  LABPROT 14.1  INR 1.08   Urine Drug Screen: Drugs of Abuse  No results found for: LABOPIA, COCAINSCRNUR, LABBENZ, AMPHETMU, THCU, LABBARB  Alcohol Level: No results for input(s): ETH in the last 72 hours. Urinalysis: No results for input(s): COLORURINE, LABSPEC, PHURINE, GLUCOSEU, HGBUR, BILIRUBINUR, KETONESUR, PROTEINUR, UROBILINOGEN, NITRITE, LEUKOCYTESUR in the last 72 hours.  Invalid input(s): APPERANCEUR Misc. Labs:  Imaging results:  Dg Abd Acute W/chest  01/02/2014   CLINICAL DATA:  Vomiting blood after drinking alcohol loss night. Abdominal pain in the middle of abdomen.  EXAM: ACUTE ABDOMEN SERIES (ABDOMEN 2 VIEW & CHEST 1 VIEW)  COMPARISON:  CT 10/04/2009  FINDINGS: There is no evidence of dilated bowel loops or free intraperitoneal air. No radiopaque calculi or other significant radiographic abnormality is seen. Heart size and mediastinal contours are within normal limits. Both lungs are clear.  IMPRESSION: Negative abdominal radiographs.  No acute cardiopulmonary disease.   Electronically Signed   By: Charlett NoseKevin  Dover M.D.   On: 01/02/2014 11:55    Other results: EKG: NSR.  Assessment & Plan by Problem: Active Problems:   Upper GI bleed   Hematemesis  Upper GI bleed/hematemesis - likely 2/2 to mallory weiss tear in the setting of heavy drinking and forceful vomitting. - likely has GERD, takes tums occasionally. Now under control as n/v is better. Other serious worry is esophageal rupture or boerhave. He is not having any SOB, no crepitus, no severe pain.  - Will need to keep close eye for worsening bleeding or any resp distress. - Hgb 13.8 - stable. Will monitor CBC closely. Not currently actively having hemopytisis on hx or exam. - FOBT negative. - Dr. Juanda ChanceBrodie [facility] consulted by ED. Appreciate recs. - carafate slurry 10cc po qid, IV protonix, bowel rest. 12125ml/hr NS. -  EGD tomorrow if continues to vomit, otherwise will not do EGD.  Alcohol abuse - banana  bag. CIWA protocol. Encourage stopping. Ativan PRN for withdrawal per ciwa.  Finger injury- Comminuted and severely displaced fifth distal phalangeal fracture,. - cont keflex (needs 6 more days).  Dispo: Disposition is deferred at this time, awaiting improvement of current medical problems. Anticipated discharge in approximately 1-2 day(s).   The patient does have a current PCP Lamar Laundry, MD) and does need an [facility] hospital follow-up appointment after discharge.  The patient does have transportation limitations that hinder transportation to clinic appointments.  Signed: Hyacinth Meeker, MD 01/02/2014, 6:44 PM

## 2014-01-02 NOTE — Consult Note (Signed)
   Subjective  Burning in his esophagus, vomitted numeous times last night, drank heavily last night with friends   Objective  Pt with hx of heavy alcohol use, beer and liquer, never had serious complication although has frequent heart burn for which he takes TUMS, denies dysphagia, pancreatitis, jaundice,Never had EGD. He thinks he vomited blood in the past Vital signs in last 24 hours: Temp:  [98 F (36.7 C)-98.4 F (36.9 C)] 98.4 F (36.9 C) (11/22 1537) Pulse Rate:  [82-105] 99 (11/22 1537) Resp:  [18-27] 18 (11/22 1537) BP: (133-188)/(87-110) 142/89 mmHg (11/22 1537) SpO2:  [94 %-100 %] 100 % (11/22 1537) Weight:  [155 lb (70.308 kg)] 155 lb (70.308 kg) (11/22 0927)   General:    white male in NAD Heart:  Regular rate and rhythm; no murmurs Lungs: Respirations even and unlabored, lungs CTA bilaterally Abdomen:  Soft, tender  In epigastrium, nondistended.,no ascites, liver edge at CM, Normal bowel sounds. Extremities:  Without edema. Neurologic:  Alert and oriented,  grossly normal neurologically.no asterixis Psych:  Cooperative. Normal mood and affect.  Intake/Output from previous day:   Intake/Output this shift:    Lab Results:  Recent Labs  01/02/14 0940  WBC 13.8*  HGB 16.0  HCT 45.7  PLT 348   BMET  Recent Labs  01/02/14 0940  NA 136*  K 4.4  CL 95*  CO2 19  GLUCOSE 107*  BUN 13  CREATININE 1.00  CALCIUM 9.2   LFT  Recent Labs  01/02/14 0940  PROT 8.0  ALBUMIN 4.5  AST 27  ALT 27  ALKPHOS 55  BILITOT 0.6   PT/INR  Recent Labs  01/02/14 1032  LABPROT 14.1  INR 1.08    Studies/Results: Dg Abd Acute W/chest  01/02/2014   CLINICAL DATA:  Vomiting blood after drinking alcohol loss night. Abdominal pain in the middle of abdomen.  EXAM: ACUTE ABDOMEN SERIES (ABDOMEN 2 VIEW & CHEST 1 VIEW)  COMPARISON:  CT 10/04/2009  FINDINGS: There is no evidence of dilated bowel loops or free intraperitoneal air. No radiopaque calculi or other  significant radiographic abnormality is seen. Heart size and mediastinal contours are within normal limits. Both lungs are clear.  IMPRESSION: Negative abdominal radiographs.  No acute cardiopulmonary disease.   Electronically Signed   By: Charlett NoseKevin  Dover M.D.   On: 01/02/2014 11:55       Assessment / Plan:   Low volume hematemesis in a heavy drinker, associated with  frceful vomiting,c/w Mallory-Weiss tear or/and reflux esophagitis. Hemodynamically stable Would start Carafate slurry 10cc po qid PPI IV Bowl rest I will tentatively put him in for EGD for tomorrow in  am if he continues to vomit blood otherwise EGD may not be necessary Follow H/H  Watch for alcohol withdrawal  Active Problems:   Upper GI bleed     LOS: 0 days   Lina SarDora Sonia Stickels  01/02/2014, 3:38 PM

## 2014-01-02 NOTE — Plan of Care (Signed)
Problem: Phase II Progression Outcomes Goal: No active bleeding Outcome: Progressing                  

## 2014-01-02 NOTE — ED Notes (Signed)
Admitting MD in with pt at this time

## 2014-01-02 NOTE — ED Notes (Signed)
Pt undressed, in gown, on continuous pulse oximetry and blood pressure cuff; warm blanket given 

## 2014-01-02 NOTE — ED Notes (Signed)
Patient transported to X-ray 

## 2014-01-03 ENCOUNTER — Encounter (HOSPITAL_COMMUNITY)
Admission: EM | Disposition: A | Discharge status: Home / Self Care | Payer: Self-pay | Source: Home / Self Care | Attending: Internal Medicine

## 2014-01-03 DIAGNOSIS — F1020 Alcohol dependence, uncomplicated: Secondary | ICD-10-CM

## 2014-01-03 DIAGNOSIS — F12229 Cannabis dependence with intoxication, unspecified: Secondary | ICD-10-CM

## 2014-01-03 DIAGNOSIS — K920 Hematemesis: Secondary | ICD-10-CM

## 2014-01-03 DIAGNOSIS — R112 Nausea with vomiting, unspecified: Secondary | ICD-10-CM

## 2014-01-03 DIAGNOSIS — S62637S Displaced fracture of distal phalanx of left little finger, sequela: Secondary | ICD-10-CM

## 2014-01-03 DIAGNOSIS — R110 Nausea: Secondary | ICD-10-CM

## 2014-01-03 DIAGNOSIS — R1013 Epigastric pain: Secondary | ICD-10-CM

## 2014-01-03 LAB — BASIC METABOLIC PANEL
ANION GAP: 12 (ref 5–15)
BUN: 15 mg/dL (ref 6–23)
CALCIUM: 8.2 mg/dL — AB (ref 8.4–10.5)
CO2: 22 mEq/L (ref 19–32)
Chloride: 108 mEq/L (ref 96–112)
Creatinine, Ser: 1.12 mg/dL (ref 0.50–1.35)
GFR calc Af Amer: 88 mL/min — ABNORMAL LOW (ref >90)
GFR calc non Af Amer: 76 mL/min — ABNORMAL LOW (ref >90)
GLUCOSE: 83 mg/dL (ref 70–99)
POTASSIUM: 4.6 meq/L (ref 3.7–5.3)
SODIUM: 142 meq/L (ref 137–147)

## 2014-01-03 LAB — CBC
HCT: 41.3 % (ref 39.0–52.0)
Hemoglobin: 13.9 g/dL (ref 13.0–17.0)
MCH: 31 pg (ref 26.0–34.0)
MCHC: 33.7 g/dL (ref 30.0–36.0)
MCV: 92 fL (ref 78.0–100.0)
Platelets: 282 10*3/uL (ref 150–400)
RBC: 4.49 MIL/uL (ref 4.22–5.81)
RDW: 13.5 % (ref 11.5–15.5)
WBC: 8.5 10*3/uL (ref 4.0–10.5)

## 2014-01-03 LAB — HEPATITIS PANEL, ACUTE
HCV AB: NEGATIVE
HEP A IGM: NONREACTIVE
HEP B S AG: NEGATIVE
Hep B C IgM: NONREACTIVE

## 2014-01-03 LAB — HIV ANTIBODY (ROUTINE TESTING W REFLEX): HIV 1&2 Ab, 4th Generation: NONREACTIVE

## 2014-01-03 SURGERY — EGD (ESOPHAGOGASTRODUODENOSCOPY)
Anesthesia: Moderate Sedation

## 2014-01-03 MED ORDER — PANTOPRAZOLE SODIUM 40 MG PO TBEC: 40.0000 mg | DELAYED_RELEASE_TABLET | Freq: Every day | ORAL | Status: DC

## 2014-01-03 NOTE — Progress Notes (Signed)
Subjective: Doing well. No n/v since yesterday morning. No bleeding anywhere. Has mild epigastric pain but no complaint otherwise. Had been NPO for possible EGD this morning.  Objective: Vital signs in last 24 hours: Filed Vitals:   01/02/14 2126 01/02/14 2354 01/03/14 0435 01/03/14 0626  BP: 156/79 132/77 137/88 127/86  Pulse: 84 78 79 60  Temp: 97.9 F (36.6 C) 98.3 F (36.8 C) 98.3 F (36.8 C) 97.8 F (36.6 C)  TempSrc: Oral Oral Oral Oral  Resp: 18 18 18 18   Height:      Weight:      SpO2: 99% 100% 100% 100%   Weight change:   Intake/Output Summary (Last 24 hours) at 01/03/14 0944 Last data filed at 01/03/14 0903  Gross per 24 hour  Intake   3075 ml  Output   2000 ml  Net   1075 ml   Vitals reviewed. General: resting in bed, NAD HEENT: PERRL, EOMI, no scleral icterus Cardiac: RRR, no rubs, murmurs or gallops Pulm: clear to auscultation bilaterally, no wheezes, rales, or rhonchi Abd: soft, mild epigastric tenderness, nondistended, BS present Ext: warm and well perfused, no pedal edema Neuro: alert and oriented X3, cranial nerves II-XII grossly intact, strength and sensation to light touch equal in bilateral upper and lower extremities  Lab Results: Basic Metabolic Panel:  Recent Labs Lab 01/02/14 0940 01/03/14 0555  NA 136* 142  K 4.4 4.6  CL 95* 108  CO2 19 22  GLUCOSE 107* 83  BUN 13 15  CREATININE 1.00 1.12  CALCIUM 9.2 8.2*   Liver Function Tests:  Recent Labs Lab 01/02/14 0940  AST 27  ALT 27  ALKPHOS 55  BILITOT 0.6  PROT 8.0  ALBUMIN 4.5    Recent Labs Lab 01/02/14 0940  LIPASE 24   No results for input(s): AMMONIA in the last 168 hours. CBC:  Recent Labs Lab 01/02/14 0940 01/02/14 1943 01/03/14 0555  WBC 13.8* 10.5 8.5  NEUTROABS 12.7*  --   --   HGB 16.0 14.0 13.9  HCT 45.7 41.0 41.3  MCV 89.8 88.2 92.0  PLT 348 303 282   Cardiac Enzymes: No results for input(s): CKTOTAL, CKMB, CKMBINDEX, TROPONINI in the last 168  hours. BNP: No results for input(s): PROBNP in the last 168 hours. D-Dimer: No results for input(s): DDIMER in the last 168 hours. CBG: No results for input(s): GLUCAP in the last 168 hours. Hemoglobin A1C: No results for input(s): HGBA1C in the last 168 hours. Fasting Lipid Panel: No results for input(s): CHOL, HDL, LDLCALC, TRIG, CHOLHDL, LDLDIRECT in the last 168 hours. Thyroid Function Tests: No results for input(s): TSH, T4TOTAL, FREET4, T3FREE, THYROIDAB in the last 168 hours. Coagulation:  Recent Labs Lab 01/02/14 1032  LABPROT 14.1  INR 1.08   Anemia Panel: No results for input(s): VITAMINB12, FOLATE, FERRITIN, TIBC, IRON, RETICCTPCT in the last 168 hours. Urine Drug Screen: Drugs of Abuse     Component Value Date/Time   LABOPIA POSITIVE* 01/02/2014 1606   COCAINSCRNUR NONE DETECTED 01/02/2014 1606   LABBENZ NONE DETECTED 01/02/2014 1606   AMPHETMU NONE DETECTED 01/02/2014 1606   THCU POSITIVE* 01/02/2014 1606   LABBARB NONE DETECTED 01/02/2014 1606    Alcohol Level: No results for input(s): ETH in the last 168 hours. Urinalysis: No results for input(s): COLORURINE, LABSPEC, PHURINE, GLUCOSEU, HGBUR, BILIRUBINUR, KETONESUR, PROTEINUR, UROBILINOGEN, NITRITE, LEUKOCYTESUR in the last 168 hours.  Invalid input(s): APPERANCEUR Misc. Labs:  Micro Results: No results found for this or any  previous visit (from the past 240 hour(s)). Studies/Results: Dg Abd Acute W/chest  01/02/2014   CLINICAL DATA:  Vomiting blood after drinking alcohol loss night. Abdominal pain in the middle of abdomen.  EXAM: ACUTE ABDOMEN SERIES (ABDOMEN 2 VIEW & CHEST 1 VIEW)  COMPARISON:  CT 10/04/2009  FINDINGS: There is no evidence of dilated bowel loops or free intraperitoneal air. No radiopaque calculi or other significant radiographic abnormality is seen. Heart size and mediastinal contours are within normal limits. Both lungs are clear.  IMPRESSION: Negative abdominal radiographs.  No acute  cardiopulmonary disease.   Electronically Signed   By: Charlett NoseKevin  Dover M.D.   On: 01/02/2014 11:55   Medications: I have reviewed the patient's current medications. Scheduled Meds: . cephALEXin  500 mg Oral TID  . folic acid  1 mg Oral Daily  . multivitamin with minerals  1 tablet Oral Daily  . sucralfate  1 g Oral 4 times per day  . thiamine  100 mg Oral Daily   Or  . thiamine  100 mg Intravenous Daily   Continuous Infusions: . pantoprozole (PROTONIX) infusion 8 mg/hr (01/03/14 0705)   PRN Meds:.LORazepam **OR** LORazepam, ondansetron **OR** ondansetron (ZOFRAN) IV Assessment/Plan: Active Problems:   Upper GI bleed   Hematemesis   Epigastric pain  [age] yo with chronic alcohols use 2-3beers/daily come3s in with hematemesis in the setting of recent heavy alcohol use and forceful vomiting.  Hematemesis - likely 2/2 to mallory weiss tear in the setting of heavy drinking and forceful vomiting. - - vomiting likely started because of alcohol use and also from possible irritation from previous alcohol related gastritis/PUD. Does take occasional tums for GERd symptoms chronically. - treatment is supportive for mallory weiss tear unless he continues to bleed but his Hgb has been stable and no longer vomiting. Was planning to get EGD if he continues to vomit per GI note yesterday. But since it is resolving, he doesn't need to get EGD. - continue carafate slurry, IV protonix. Will likely send home with protonix with follow up with PCP. - encouraged patient to stop using alcohol.  - GI signed off. No further recs.  Alcohol abuse - got banana bag, was on ciwa protocol. No withdrawal.  - encouarge stopping heavy drinking  Substance abuse hx - UDS positive for THC, also opiate. But this was done after he received opiate at ED - encouraged stopping any substance use  Finger injury- Comminuted and severely displaced fifth distal phalangeal fracture,. - cont keflex (needs 5 more days).  Dispo:  Disposition is deferred at this time, awaiting improvement of current medical problems.  Anticipated discharge in approximately 1-2 day(s).   The patient does have a current PCP Lamar Laundry(Laura C Mayans, MD) and [facility].  The patient does have transportation limitations that hinder transportation to clinic appointments.  .Services Needed at time of discharge: Y = Yes, Blank = No PT:   OT:   RN:   Equipment:   Other:     LOS: 1 day   Hyacinth Meekerasrif Lucine Bilski, MD 01/03/2014, 9:44 AM

## 2014-01-03 NOTE — Progress Notes (Signed)
Pt was seen and examined.  No further nausea and vomiting, overt bleeding.  Hg is stable.   May advance diet.  Would not pursue GI workup unless he develops recurrent bleeding.  Signing off.

## 2014-01-03 NOTE — Progress Notes (Signed)
Nsg Discharge Note  Admit Date:  01/02/2014 Discharge date: 01/03/2014   Devin Chan to be D/C'd Home per MD order.  AVS completed.  Copy for chart, and copy for patient signed, and dated. Patient/caregiver able to verbalize understanding.  Discharge Medication:   Medication List    TAKE these medications        aspirin 81 MG tablet  Take 81 mg by mouth daily.     cephALEXin 500 MG capsule  Commonly known as:  KEFLEX  Take 1 capsule (500 mg total) by mouth 3 (three) times daily.     pantoprazole 40 MG tablet  Commonly known as:  PROTONIX  Take 1 tablet (40 mg total) by mouth daily.        Discharge Assessment: Filed Vitals:   01/03/14 1425  BP: 124/71  Pulse: 75  Temp: 98.8 F (37.1 C)  Resp: 18   Skin clean, dry and intact without evidence of skin break down, no evidence of skin tears noted. IV catheter discontinued intact. Site without signs and symptoms of complications - no redness or edema noted at insertion site, patient denies c/o pain - only slight tenderness at site.  Dressing with slight pressure applied.  D/c Instructions-Education: Discharge instructions given to patient/family with verbalized understanding. D/c education completed with patient/family including follow up instructions, medication list, d/c activities limitations if indicated, with other d/c instructions as indicated by MD - patient able to verbalize understanding, all questions fully answered. Patient instructed to return to ED, call 911, or call MD for any changes in condition.  Patient escorted via WC, and D/C home via private auto.  Kern ReapBrumagin, Deen Deguia L, RN 01/03/2014 4:42 PM

## 2014-01-03 NOTE — Discharge Instructions (Signed)
Please cut down on your alcohol use.   Please take protonix daily for 8 weeks to help with your gastritis/acid reflux.  Follow up at wellness clinic.  Alcohol Use Disorder Alcohol use disorder is a mental disorder. It is not a one-time incident of heavy drinking. Alcohol use disorder is the excessive and uncontrollable use of alcohol over time that leads to problems with functioning in one or more areas of daily living. People with this disorder risk harming themselves and others when they drink to excess. Alcohol use disorder also can cause other mental disorders, such as mood and anxiety disorders, and serious physical problems. People with alcohol use disorder often misuse other drugs.  Alcohol use disorder is common and widespread. Some people with this disorder drink alcohol to cope with or escape from negative life events. Others drink to relieve chronic pain or symptoms of mental illness. People with a family history of alcohol use disorder are at higher risk of losing control and using alcohol to excess.  SYMPTOMS  Signs and symptoms of alcohol use disorder may include the following:   Consumption ofalcohol inlarger amounts or over a longer period of time than intended.  Multiple unsuccessful attempts to cutdown or control alcohol use.   A great deal of time spent obtaining alcohol, using alcohol, or recovering from the effects of alcohol (hangover).  A strong desire or urge to use alcohol (cravings).   Continued use of alcohol despite problems at work, school, or home because of alcohol use.   Continued use of alcohol despite problems in relationships because of alcohol use.  Continued use of alcohol in situations when it is physically hazardous, such as driving a car.  Continued use of alcohol despite awareness of a physical or psychological problem that is likely related to alcohol use. Physical problems related to alcohol use can involve the brain, heart, liver, stomach,  and intestines. Psychological problems related to alcohol use include intoxication, depression, anxiety, psychosis, delirium, and dementia.   The need for increased amounts of alcohol to achieve the same desired effect, or a decreased effect from the consumption of the same amount of alcohol (tolerance).  Withdrawal symptoms upon reducing or stopping alcohol use, or alcohol use to reduce or avoid withdrawal symptoms. Withdrawal symptoms include:  Racing heart.  Hand tremor.  Difficulty sleeping.  Nausea.  Vomiting.  Hallucinations.  Restlessness.  Seizures. DIAGNOSIS Alcohol use disorder is diagnosed through an assessment by your health care provider. Your health care provider may start by asking three or four questions to screen for excessive or problematic alcohol use. To confirm a diagnosis of alcohol use disorder, at least two symptoms must be present within a 2260-month period. The severity of alcohol use disorder depends on the number of symptoms:  Mild--two or three.  Moderate--four or five.  Severe--six or more. Your health care provider may perform a physical exam or use results from lab tests to see if you have physical problems resulting from alcohol use. Your health care provider may refer you to a mental health professional for evaluation. TREATMENT  Some people with alcohol use disorder are able to reduce their alcohol use to low-risk levels. Some people with alcohol use disorder need to quit drinking alcohol. When necessary, mental health professionals with specialized training in substance use treatment can help. Your health care provider can help you decide how severe your alcohol use disorder is and what type of treatment you need. The following forms of treatment are available:  Detoxification. Detoxification involves the use of prescription medicines to prevent alcohol withdrawal symptoms in the first week after quitting. This is important for people with a  history of symptoms of withdrawal and for heavy drinkers who are likely to have withdrawal symptoms. Alcohol withdrawal can be dangerous and, in severe cases, cause death. Detoxification is usually provided in a hospital or in-patient substance use treatment facility.  Counseling or talk therapy. Talk therapy is provided by substance use treatment counselors. It addresses the reasons people use alcohol and ways to keep them from drinking again. The goals of talk therapy are to help people with alcohol use disorder find healthy activities and ways to cope with life stress, to identify and avoid triggers for alcohol use, and to handle cravings, which can cause relapse.  Medicines.Different medicines can help treat alcohol use disorder through the following actions:  Decrease alcohol cravings.  Decrease the positive reward response felt from alcohol use.  Produce an uncomfortable physical reaction when alcohol is used (aversion therapy).  Support groups. Support groups are run by people who have quit drinking. They provide emotional support, advice, and guidance. These forms of treatment are often combined. Some people with alcohol use disorder benefit from intensive combination treatment provided by specialized substance use treatment centers. Both inpatient and outpatient treatment programs are available. Document Released: 03/07/2004 Document Revised: 06/14/2013 Document Reviewed: 05/07/2012 [facility] Patient Information 2015 [employer] [place]. This information is not intended to replace advice given to you by your health care provider. Make sure you discuss any questions you have with your health care provider.

## 2014-01-03 NOTE — Discharge Summary (Signed)
Name: Devin Chan MRN: [id number] DOB: 04-28-1965 48 y.o. PCP: Lamar LaundryLaura C Mayans, MD  Date of Admission: 01/02/2014  9:23 AM Date of Discharge: 01/03/2014 Attending Physician: Farley LyJerry Dale Joines, MD  Discharge Diagnosis:  Active Problems:   Upper GI bleed   Hematemesis   Epigastric pain  Discharge Medications:   Medication List    TAKE these medications        aspirin 81 MG tablet  Take 81 mg by mouth daily.     cephALEXin 500 MG capsule  Commonly known as:  KEFLEX  Take 1 capsule (500 mg total) by mouth 3 (three) times daily.     pantoprazole 40 MG tablet  Commonly known as:  PROTONIX  Take 1 tablet (40 mg total) by mouth daily.        Disposition and follow-up:   [facility] St[facility].  At the hospital follow up visit please address:  1.  To finish keflex for his finger wound 5 more days. Total 7days course. Please assess for any GERD symptoms, we started on PPI as he came in with hematemesis likely 2/2 to mallory weiss tear. We are sending him with 8 weeks of protonix 40mg  po.  Please encourage alcohol cessation.   2.  Labs / imaging needed at time of follow-up:   3.  Pending labs/ test needing follow-up:   Follow-up Appointments: Follow-up Information    Follow up with [facility]    .   Why:  case manager will help you to make appt   Contact information:   [address] [place] [id number] [phone]      Discharge Instructions:   Consultations:    GI Dr. Arlyce DiceKaplan, Dr. Juanda ChanceBrodie.  Procedures Performed:  Dg Abd Acute W/chest  01/02/2014   CLINICAL DATA:  Vomiting blood after drinking alcohol loss night. Abdominal pain in the middle of abdomen.  EXAM: ACUTE ABDOMEN SERIES (ABDOMEN 2 VIEW & CHEST 1 VIEW)  COMPARISON:  CT 10/04/2009  FINDINGS: There is no evidence of dilated bowel loops or free intraperitoneal air. No radiopaque  calculi or other significant radiographic abnormality is seen. Heart size and mediastinal contours are within normal limits. Both lungs are clear.  IMPRESSION: Negative abdominal radiographs.  No acute cardiopulmonary disease.   Electronically Signed   By: Charlett NoseKevin  Dover M.D.   On: 01/02/2014 11:55   Dg Finger Little Left  12/29/2013   CLINICAL DATA:  Left finger pain.  EXAM: LEFT LITTLE FINGER 2+V  COMPARISON:  None.  FINDINGS: Comminuted and severely displaced fracture is seen involving the fifth distal phalanx. This appears to be closed and posttraumatic. No radiopaque foreign body or other soft tissue abnormality is noted.  IMPRESSION: Comminuted and severely displaced fifth distal phalangeal fracture, initial encounter.   Electronically Signed   By: Roque LiasJames  Green M.D.   On: 12/29/2013 17:56    2D Echo:   Cardiac Cath:   Admission HPI:   48 yo male with hx of heavy alcohol drinking comes in with hemoptysis since last night. Was at a concert and drank some "hard mixed drinks". Came back to friend's house and his stomach started to hurt and starting to vomit. First it was just the stomach content then as he kept vomiting he started to have blood mixed with the emesis. It was about ~1 tablet spoon quantity each time, he vomitted for 6-7 times. He woke up  every hour to vomit. He had forceful vomiting. He usually takes tums for occasional heart burn, his stomach pain is similar to heart burn, couldn't take any tums as friend didn't have any. He drinks 2-3 beers every night. Used to smoke for 30 years x2 ppd, quit 2 years ago. Occasional marijuana use.   He denies any trouble breathing, any cough, any chest pain. No diarrhea, fever, chills. No stool color changes or bleeding. BM normal, last yesterday. Never had EGD in the past. Was taking keflex for broken finger (has 6 more days left).   No acute change on CXR. Neg abdomen.    Hospital Course by problem list: Active Problems:   Upper GI bleed    Hematemesis   Epigastric pain   48 yo with chronic alcohols use 2-3beers/daily come3s in with hematemesis in the setting of recent heavy alcohol use and forceful vomiting.  Hematemesis - likely 2/2 to mallory weiss tear in the setting of heavy drinking and forceful vomiting. - - vomiting likely started because of alcohol use and also from possible irritation from previous alcohol related gastritis/PUD. Does take occasional tums for GERd symptoms chronically. - treatment is supportive for mallory weiss tear unless he continues to bleed but his Hgb has been stable and no longer vomiting.  - GI was consulted and felt that his symptoms were consistent with [place] Artist tear. Since his symptoms resolved by day 2, they recommended NO EGD or further workup unless he has recurrent bleeding. - did carafate slurry, IV protonix. Will send home with protonix 40mg  8 weeks with follow up with PCP. - encouraged patient to stop using alcohol.  - GI signed off. No further recs.   Alcohol abuse - got Thiamine, folate, multi vitamin. was on ciwa protocol. No withdrawal.  - encouarge stopping heavy drinking  Substance abuse hx - UDS positive for THC, also opiate. But this was done after he received opiate at ED - encouraged stopping any substance use  Finger injury- Comminuted and severely displaced fifth distal phalangeal fracture. No open wounds. Has appt with ortho on tomorrow.  - cont keflex  Total 7 days, was started by urgent care.  Discharge Vitals:   BP 124/71 mmHg  Pulse 75  Temp(Src) 98.8 F (37.1 C) (Oral)  Resp 18  Ht 5\' 6"  (1.676 m)  Wt 69.99 kg (154 lb 4.8 oz)  BMI 24.92 kg/m2  SpO2 96%  Discharge Labs:  Results for orders placed or performed during the hospital encounter of 01/02/14 (from the past 24 hour(s))  CBC     Status: None   Collection Time: 01/02/14  7:43 PM  Result Value Ref Range   WBC 10.5 4.0 - 10.5 K/uL   RBC 4.65 4.22 - 5.81 MIL/uL   Hemoglobin 14.0 13.0 - 17.0  g/dL   HCT 16.1 09.6 - 04.5 %   MCV 88.2 78.0 - 100.0 fL   MCH 30.1 26.0 - 34.0 pg   MCHC 34.1 30.0 - 36.0 g/dL   RDW 40.9 81.1 - 91.4 %   Platelets 303 150 - 400 K/uL  HIV antibody     Status: None   Collection Time: 01/02/14  7:46 PM  Result Value Ref Range   HIV 1&2 Ab, 4th Generation NONREACTIVE NONREACTIVE  Hepatitis panel, acute     Status: None   Collection Time: 01/02/14  7:46 PM  Result Value Ref Range   Hepatitis B Surface Ag NEGATIVE NEGATIVE   HCV Ab NEGATIVE NEGATIVE   Hep  A IgM NON REACTIVE NON REACTIVE   Hep B C IgM NON REACTIVE NON REACTIVE  Basic metabolic panel     Status: Abnormal   Collection Time: 01/03/14  5:55 AM  Result Value Ref Range   Sodium 142 137 - 147 mEq/L   Potassium 4.6 3.7 - 5.3 mEq/L   Chloride 108 96 - 112 mEq/L   CO2 22 19 - 32 mEq/L   Glucose, Bld 83 70 - 99 mg/dL   BUN 15 6 - 23 mg/dL   Creatinine, Ser 1.611.12 0.50 - 1.35 mg/dL   Calcium 8.2 (L) 8.4 - 10.5 mg/dL   GFR calc non Af Amer 76 (L) >90 mL/min   GFR calc Af Amer 88 (L) >90 mL/min   Anion gap 12 5 - 15  CBC     Status: None   Collection Time: 01/03/14  5:55 AM  Result Value Ref Range   WBC 8.5 4.0 - 10.5 K/uL   RBC 4.49 4.22 - 5.81 MIL/uL   Hemoglobin 13.9 13.0 - 17.0 g/dL   HCT 09.641.3 04.539.0 - 40.952.0 %   MCV 92.0 78.0 - 100.0 fL   MCH 31.0 26.0 - 34.0 pg   MCHC 33.7 30.0 - 36.0 g/dL   RDW 81.113.5 91.411.5 - 78.215.5 %   Platelets 282 150 - 400 K/uL    Signed: Hyacinth Meekerasrif Mackena Plummer, MD 01/03/2014, 4:07 PM    Services Ordered on Discharge:  Equipment Ordered on Discharge:

## 2014-01-03 NOTE — Progress Notes (Signed)
UR completed 

## 2014-01-03 NOTE — Care Management Note (Signed)
    Page 1 of 1   01/04/2014     3:04:59 PM CARE MANAGEMENT NOTE 01/04/2014  Patient:  Devin Chan,Devin Chan   Account Number:  [id number]  Date Initiated:  01/03/2014  Documentation initiated by:  Devin Chan,Devin Chan  Subjective/Objective Assessment:   dx gib (upper)  admit- lives alone.     Action/Plan:   Anticipated DC Date:  01/03/2014   Anticipated DC Plan:  HOME/SELF CARE      DC Planning Services  CM consult  Follow-up appt scheduled      Choice offered to / List presented to:             Status of service:  Completed, signed off Medicare Important Message given?  NO (If response is "NO", the following Medicare IM given date fields will be blank) Date Medicare IM given:   Medicare IM given by:   Date Additional Medicare IM given:   Additional Medicare IM given by:    Discharge Disposition:  HOME/SELF CARE  Per UR Regulation:  Reviewed for med. necessity/level of care/duration of stay  If discussed at Long Length of Stay Meetings, dates discussed:    Comments:  01/03/14 1458 Devin Capeeborah Allayna Erlich RN, BSN [phone] patient lives alone, NCM gave patient information for the [facility] and informed patient to call at 9am on 11/24 to make appt.

## 2014-01-03 NOTE — Clinical Social Work Note (Signed)
CSW received call from MD stating that patient is ready for DC and needs appointment with [facility]. RNCM notified. CSW signing off.   Roddie McBryant Jayvin Hurrell MSW, KimberlyLCSWA, [place], [phone]

## 2015-01-17 ENCOUNTER — Ambulatory Visit (INDEPENDENT_AMBULATORY_CARE_PROVIDER_SITE_OTHER): Payer: Self-pay | Admitting: Family Medicine

## 2015-01-17 VITALS — BP 124/84 | HR 115 | Temp 99.3°F | Resp 17 | Ht 65.5 in | Wt 158.0 lb

## 2015-01-17 DIAGNOSIS — R059 Cough, unspecified: Secondary | ICD-10-CM

## 2015-01-17 DIAGNOSIS — Z8709 Personal history of other diseases of the respiratory system: Secondary | ICD-10-CM

## 2015-01-17 DIAGNOSIS — J988 Other specified respiratory disorders: Secondary | ICD-10-CM

## 2015-01-17 DIAGNOSIS — R05 Cough: Secondary | ICD-10-CM

## 2015-01-17 DIAGNOSIS — J069 Acute upper respiratory infection, unspecified: Secondary | ICD-10-CM

## 2015-01-17 DIAGNOSIS — J22 Unspecified acute lower respiratory infection: Secondary | ICD-10-CM

## 2015-01-17 MED ORDER — ALBUTEROL SULFATE 108 (90 BASE) MCG/ACT IN AEPB: 1.0000 | INHALATION_SPRAY | RESPIRATORY_TRACT | Status: DC | PRN

## 2015-01-17 MED ORDER — AZITHROMYCIN 250 MG PO TABS: ORAL_TABLET | ORAL | Status: DC

## 2015-01-17 NOTE — Patient Instructions (Addendum)
Saline nasal spray atleast 4 times per day for nasal congestion, over the counter mucinex or mucinex DM (stop if this increases wheezing) for cough, albuterol if needed for wheezing. Start Zpak if not improving into tomorrow morning. Tylenol if needed for bodyaches or fever.   Return to the clinic or go to the nearest emergency room if any of your symptoms worsen or new symptoms occur.  Cough, Adult Coughing is a reflex that clears your throat and your airways. Coughing helps to heal and protect your lungs. It is normal to cough occasionally, but a cough that happens with other symptoms or lasts a long time may be a sign of a condition that needs treatment. A cough may last only 2-3 weeks (acute), or it may last longer than 8 weeks (chronic). CAUSES Coughing is commonly caused by:  Breathing in substances that irritate your lungs.  A viral or bacterial respiratory infection.  Allergies.  Asthma.  Postnasal drip.  Smoking.  Acid backing up from the stomach into the esophagus (gastroesophageal reflux).  Certain medicines.  Chronic lung problems, including COPD (or rarely, lung cancer).  Other medical conditions such as heart failure. HOME CARE INSTRUCTIONS  Pay attention to any changes in your symptoms. Take these actions to help with your discomfort:  Take medicines only as told by your health care provider.  If you were prescribed an antibiotic medicine, take it as told by your health care provider. Do not stop taking the antibiotic even if you start to feel better.  Talk with your health care provider before you take a cough suppressant medicine.  Drink enough fluid to keep your urine clear or pale yellow.  If the air is dry, use a cold steam vaporizer or humidifier in your bedroom or your home to help loosen secretions.  Avoid anything that causes you to cough at work or at home.  If your cough is worse at night, try sleeping in a semi-upright position.  Avoid cigarette  smoke. If you smoke, quit smoking. If you need help quitting, ask your health care provider.  Avoid caffeine.  Avoid alcohol.  Rest as needed. SEEK MEDICAL CARE IF:   You have new symptoms.  You cough up pus.  Your cough does not get better after 2-3 weeks, or your cough gets worse.  You cannot control your cough with suppressant medicines and you are losing sleep.  You develop pain that is getting worse or pain that is not controlled with pain medicines.  You have a fever.  You have unexplained weight loss.  You have night sweats. SEEK IMMEDIATE MEDICAL CARE IF:  You cough up blood.  You have difficulty breathing.  Your heartbeat is very fast.   This information is not intended to replace advice given to you by your health care provider. Make sure you discuss any questions you have with your health care provider.   Document Released: 07/27/2010 Document Revised: 10/19/2014 Document Reviewed: 04/06/2014 Elsevier Interactive Patient Education 2016 [employer]  Upper Respiratory Infection, Adult Most upper respiratory infections (URIs) are a viral infection of the air passages leading to the lungs. A URI affects the nose, throat, and upper air passages. The most common type of URI is nasopharyngitis and is typically referred to as "the common cold." URIs run their course and usually go away on their own. Most of the time, a URI does not require medical attention, but sometimes a bacterial infection in the upper airways can follow a viral infection. This is  called a secondary infection. Sinus and middle ear infections are common types of secondary upper respiratory infections. Bacterial pneumonia can also complicate a URI. A URI can worsen asthma and chronic obstructive pulmonary disease (COPD). Sometimes, these complications can require emergency medical care and may be life threatening.  CAUSES Almost all URIs are caused by viruses. A virus is a type of germ and can spread  from one person to another.  RISKS FACTORS You may be at risk for a URI if:   You smoke.   You have chronic heart or lung disease.  You have a weakened defense (immune) system.   You are very young or very old.   You have nasal allergies or asthma.  You work in crowded or poorly ventilated areas.  You work in health care facilities or schools. SIGNS AND SYMPTOMS  Symptoms typically develop 2-3 days after you come in contact with a cold virus. Most viral URIs last 7-10 days. However, viral URIs from the influenza virus (flu virus) can last 14-18 days and are typically more severe. Symptoms may include:   Runny or stuffy (congested) nose.   Sneezing.   Cough.   Sore throat.   Headache.   Fatigue.   Fever.   Loss of appetite.   Pain in your forehead, behind your eyes, and over your cheekbones (sinus pain).  Muscle aches.  DIAGNOSIS  Your health care provider may diagnose a URI by:  Physical exam.  Tests to check that your symptoms are not due to another condition such as:  Strep throat.  Sinusitis.  Pneumonia.  Asthma. TREATMENT  A URI goes away on its own with time. It cannot be cured with medicines, but medicines may be prescribed or recommended to relieve symptoms. Medicines may help:  Reduce your fever.  Reduce your cough.  Relieve nasal congestion. HOME CARE INSTRUCTIONS   Take medicines only as directed by your health care provider.   Gargle warm saltwater or take cough drops to comfort your throat as directed by your health care provider.  Use a warm mist humidifier or inhale steam from a shower to increase air moisture. This may make it easier to breathe.  Drink enough fluid to keep your urine clear or pale yellow.   Eat soups and other clear broths and maintain good nutrition.   Rest as needed.   Return to work when your temperature has returned to normal or as your health care provider advises. You may need to stay home  longer to avoid infecting others. You can also use a face mask and careful hand washing to prevent spread of the virus.  Increase the usage of your inhaler if you have asthma.   Do not use any tobacco products, including cigarettes, chewing tobacco, or electronic cigarettes. If you need help quitting, ask your health care provider. PREVENTION  The best way to protect yourself from getting a cold is to practice good hygiene.   Avoid oral or hand contact with people with cold symptoms.   Wash your hands often if contact occurs.  There is no clear evidence that vitamin C, vitamin E, echinacea, or exercise reduces the chance of developing a cold. However, it is always recommended to get plenty of rest, exercise, and practice good nutrition.  SEEK MEDICAL CARE IF:   You are getting worse rather than better.   Your symptoms are not controlled by medicine.   You have chills.  You have worsening shortness of breath.  You have brown  or red mucus.  You have yellow or brown nasal discharge.  You have pain in your face, especially when you bend forward.  You have a fever.  You have swollen neck glands.  You have pain while swallowing.  You have white areas in the back of your throat. SEEK IMMEDIATE MEDICAL CARE IF:   You have severe or persistent:  Headache.  Ear pain.  Sinus pain.  Chest pain.  You have chronic lung disease and any of the following:  Wheezing.  Prolonged cough.  Coughing up blood.  A change in your usual mucus.  You have a stiff neck.  You have changes in your:  Vision.  Hearing.  Thinking.  Mood. MAKE SURE YOU:   Understand these instructions.  Will watch your condition.  Will get help right away if you are not doing well or get worse.   This information is not intended to replace advice given to you by your health care provider. Make sure you discuss any questions you have with your health care provider.   Document Released:  07/24/2000 Document Revised: 06/14/2014 Document Reviewed: 05/05/2013 Elsevier Interactive Patient Education 2016 [employer]  Acute Bronchitis Bronchitis is inflammation of the airways that extend from the windpipe into the lungs (bronchi). The inflammation often causes mucus to develop. This leads to a cough, which is the most common symptom of bronchitis.  In acute bronchitis, the condition usually develops suddenly and goes away over time, usually in a couple weeks. Smoking, allergies, and asthma can make bronchitis worse. Repeated episodes of bronchitis may cause further lung problems.  CAUSES Acute bronchitis is most often caused by the same virus that causes a cold. The virus can spread from person to person (contagious) through coughing, sneezing, and touching contaminated objects. SIGNS AND SYMPTOMS   Cough.   Fever.   Coughing up mucus.   Body aches.   Chest congestion.   Chills.   Shortness of breath.   Sore throat.  DIAGNOSIS  Acute bronchitis is usually diagnosed through a physical exam. Your health care provider will also ask you questions about your medical history. Tests, such as chest X-rays, are sometimes done to rule out other conditions.  TREATMENT  Acute bronchitis usually goes away in a couple weeks. Oftentimes, no medical treatment is necessary. Medicines are sometimes given for relief of fever or cough. Antibiotic medicines are usually not needed but may be prescribed in certain situations. In some cases, an inhaler may be recommended to help reduce shortness of breath and control the cough. A cool mist vaporizer may also be used to help thin bronchial secretions and make it easier to clear the chest.  HOME CARE INSTRUCTIONS  Get plenty of rest.   Drink enough fluids to keep your urine clear or pale yellow (unless you have a medical condition that requires fluid restriction). Increasing fluids may help thin your respiratory secretions (sputum) and  reduce chest congestion, and it will prevent dehydration.   Take medicines only as directed by your health care provider.  If you were prescribed an antibiotic medicine, finish it all even if you start to feel better.  Avoid smoking and secondhand smoke. Exposure to cigarette smoke or irritating chemicals will make bronchitis worse. If you are a smoker, consider using nicotine gum or skin patches to help control withdrawal symptoms. Quitting smoking will help your lungs heal faster.   Reduce the chances of another bout of acute bronchitis by washing your hands frequently, avoiding people with cold  symptoms, and trying not to touch your hands to your mouth, nose, or eyes.   Keep all follow-up visits as directed by your health care provider.  SEEK MEDICAL CARE IF: Your symptoms do not improve after 1 week of treatment.  SEEK IMMEDIATE MEDICAL CARE IF:  You develop an increased fever or chills.   You have chest pain.   You have severe shortness of breath.  You have bloody sputum.   You develop dehydration.  You faint or repeatedly feel like you are going to pass out.  You develop repeated vomiting.  You develop a severe headache. MAKE SURE YOU:   Understand these instructions.  Will watch your condition.  Will get help right away if you are not doing well or get worse.   This information is not intended to replace advice given to you by your health care provider. Make sure you discuss any questions you have with your health care provider.   Document Released: 03/07/2004 Document Revised: 02/18/2014 Document Reviewed: 07/21/2012 [employer] Interactive Patient Education [employer]

## 2015-01-17 NOTE — Progress Notes (Signed)
Subjective:  This chart was scribed for Meredith Staggers, MD by Stann Ore, Medical Scribe. This patient was seen in room 3 and the patient's care was started 2:03 PM.   Patient ID: Devin Chan, male    DOB: Jan 24, 1966, 49 y.o.   MRN: [id number] Chief Complaint  Patient presents with  . Cough  . URI  . Sinusitis    HPI Devin Chan is a 49 y.o. male Pt is here for gradual onset URI symptoms including cough and sinus pressure that started 3-4 days ago. Yesterday, he had some congestion and drained down to his chest. He coughed out green phlegm this morning, and clear rhinorrhea. He also felt a fever earlier this morning as well. He had some wheezing prior to coming into the office and sweats while in the exam room. He mentions taking a hot bath with relief last night. He denies chest pain.   He quit smoking 3.5 years ago. He smoked for 30 years. He has history of asthma.   Patient Active Problem List   Diagnosis Date Noted  . Upper GI bleed 01/02/2014  . Hematemesis 01/02/2014  . Epigastric pain    Past Medical History  Diagnosis Date  . Arthritis   . Asthma   . Depression    Past Surgical History  Procedure Laterality Date  . Cleft palate repair  age 44    [facility]  . No past surgeries     No Known Allergies Prior to Admission medications   Not on File   Social History   Social History  . Marital Status: Single    Spouse Name: N/A  . Number of Children: N/A  . Years of Education: N/A   Occupational History  . Not on file.   Social History Main Topics  . Smoking status: Former Smoker -- 1.00 packs/day for 30 years    Types: Cigarettes    Quit date: 05/01/2011  . Smokeless tobacco: Never Used     Comment: quit x 6 months at age 65  . Alcohol Use: 16.2 oz/week    12 Standard drinks or equivalent, 15 Cans of beer per week  . Drug Use: Yes    Special: Marijuana  . Sexual Activity: Yes   Other Topics Concern  . Not on file   Social History  Narrative   Review of Systems  Constitutional: Positive for fever, chills and diaphoresis.  HENT: Positive for congestion, postnasal drip, rhinorrhea and sinus pressure.   Respiratory: Positive for cough and shortness of breath.   Cardiovascular: Negative for chest pain.  Gastrointestinal: Negative for nausea, vomiting, diarrhea and constipation.       Objective:   Physical Exam  Constitutional: He is oriented to person, place, and time. He appears well-developed and well-nourished.  HENT:  Head: Normocephalic and atraumatic.  Right Ear: Tympanic membrane, external ear and ear canal normal.  Left Ear: Tympanic membrane, external ear and ear canal normal.  Nose: No rhinorrhea.  Mouth/Throat: Oropharynx is clear and moist and mucous membranes are normal. No oropharyngeal exudate or posterior oropharyngeal erythema.  Minimal fluid behind right TM Sinuses tender to palpation: maxillary greater than frontal  Eyes: Conjunctivae are normal. Pupils are equal, round, and reactive to light.  Neck: Neck supple.  Cardiovascular: Normal rate, regular rhythm, normal heart sounds and intact distal pulses.   No murmur heard. Pulmonary/Chest: Effort normal and breath sounds normal. He has no wheezes. He has no rhonchi. He has no rales.  Abdominal: Soft.  There is no tenderness.  Lymphadenopathy:    He has no cervical adenopathy.  Neurological: He is alert and oriented to person, place, and time.  Skin: Skin is warm and dry. No rash noted.  Psychiatric: He has a normal mood and affect. His behavior is normal.  Vitals reviewed.   Filed Vitals:   01/17/15 1322  BP: 124/84  Pulse: 115  Temp: 99.3 F (37.4 C)  TempSrc: Oral  Resp: 17  Height: 5' 5.5" (1.664 m)  Weight: 158 lb (71.668 kg)  SpO2: 97%      Assessment & Plan:   Devin Chan is a 49 y.o. male LRTI (lower respiratory tract infection) - Plan: azithromycin (ZITHROMAX) 250 MG tablet  Cough - Plan: Albuterol Sulfate (PROAIR  RESPICLICK) 108 (90 BASE) MCG/ACT AEPB  History of asthma - Plan: Albuterol Sulfate (PROAIR RESPICLICK) 108 (90 BASE) MCG/ACT AEPB  Acute upper respiratory infection Initial hx probable viral URI/sinus congestion, with progression to chest congestion, worsened cough, and low grade fever. Still possible influenza or other viral infection  vs early CAP/LRTI/bronchitis. Clear on exam, O2 sat ok.  -sx care with mucinex, saline NS, tylenol if needed for body aches and with hx of asthma - albuterol if needed.   -if f/c persist into tomorrow am - start Zpak.   -RTC precautions discussed.   Meds ordered this encounter  Medications  . azithromycin (ZITHROMAX) 250 MG tablet    Sig: Take 2 pills by mouth on day 1, then 1 pill by mouth per day on days 2 through 5.    Dispense:  6 tablet    Refill:  0  . Albuterol Sulfate (PROAIR RESPICLICK) 108 (90 BASE) MCG/ACT AEPB    Sig: Inhale 1 Inhaler into the lungs every 4 (four) hours as needed. For wheezing or shortness of breath    Dispense:  1 each    Refill:  0   Patient Instructions  Saline nasal spray atleast 4 times per day for nasal congestion, over the counter mucinex or mucinex DM (stop if this increases wheezing) for cough, albuterol if needed for wheezing. Start Zpak if not improving into tomorrow morning. Tylenol if needed for bodyaches or fever.   Return to the clinic or go to the nearest emergency room if any of your symptoms worsen or new symptoms occur.  Cough, Adult Coughing is a reflex that clears your throat and your airways. Coughing helps to heal and protect your lungs. It is normal to cough occasionally, but a cough that happens with other symptoms or lasts a long time may be a sign of a condition that needs treatment. A cough may last only 2-3 weeks (acute), or it may last longer than 8 weeks (chronic). CAUSES Coughing is commonly caused by:  Breathing in substances that irritate your lungs.  A viral or bacterial respiratory  infection.  Allergies.  Asthma.  Postnasal drip.  Smoking.  Acid backing up from the stomach into the esophagus (gastroesophageal reflux).  Certain medicines.  Chronic lung problems, including COPD (or rarely, lung cancer).  Other medical conditions such as heart failure. HOME CARE INSTRUCTIONS  Pay attention to any changes in your symptoms. Take these actions to help with your discomfort:  Take medicines only as told by your health care provider.  If you were prescribed an antibiotic medicine, take it as told by your health care provider. Do not stop taking the antibiotic even if you start to feel better.  Talk with your health care provider  before you take a cough suppressant medicine.  Drink enough fluid to keep your urine clear or pale yellow.  If the air is dry, use a cold steam vaporizer or humidifier in your bedroom or your home to help loosen secretions.  Avoid anything that causes you to cough at work or at home.  If your cough is worse at night, try sleeping in a semi-upright position.  Avoid cigarette smoke. If you smoke, quit smoking. If you need help quitting, ask your health care provider.  Avoid caffeine.  Avoid alcohol.  Rest as needed. SEEK MEDICAL CARE IF:   You have new symptoms.  You cough up pus.  Your cough does not get better after 2-3 weeks, or your cough gets worse.  You cannot control your cough with suppressant medicines and you are losing sleep.  You develop pain that is getting worse or pain that is not controlled with pain medicines.  You have a fever.  You have unexplained weight loss.  You have night sweats. SEEK IMMEDIATE MEDICAL CARE IF:  You cough up blood.  You have difficulty breathing.  Your heartbeat is very fast.   This information is not intended to replace advice given to you by your health care provider. Make sure you discuss any questions you have with your health care provider.   Document Released:  07/27/2010 Document Revised: 10/19/2014 Document Reviewed: 04/06/2014 Elsevier Interactive Patient Education 2016 Else[employer] Inc[employer]  Upper Respiratory Infection, Adult Most upper respiratory infections (URIs) are a viral infection of the air passages leading to the lungs. A URI affects the nose, throat, and upper air passages. The most common type of URI is nasopharyngitis and is typically referred to as "the common cold." URIs run their course and usually go away on their own. Most of the time, a URI does not require medical attention, but sometimes a bacterial infection in the upper airways can follow a viral infection. This is called a secondary infection. Sinus and middle ear infections are common types of secondary upper respiratory infections. Bacterial pneumonia can also complicate a URI. A URI can worsen asthma and chronic obstructive pulmonary disease (COPD). Sometimes, these complications can require emergency medical care and may be life threatening.  CAUSES Almost all URIs are caused by viruses. A virus is a type of germ and can spread from one person to another.  RISKS FACTORS You may be at risk for a URI if:   You smoke.   You have chronic heart or lung disease.  You have a weakened defense (immune) system.   You are very young or very old.   You have nasal allergies or asthma.  You work in crowded or poorly ventilated areas.  You work in health care facilities or schools. SIGNS AND SYMPTOMS  Symptoms typically develop 2-3 days after you come in contact with a cold virus. Most viral URIs last 7-10 days. However, viral URIs from the influenza virus (flu virus) can last 14-18 days and are typically more severe. Symptoms may include:   Runny or stuffy (congested) nose.   Sneezing.   Cough.   Sore throat.   Headache.   Fatigue.   Fever.   Loss of appetite.   Pain in your forehead, behind your eyes, and over your cheekbones (sinus pain).  Muscle aches.   DIAGNOSIS  Your health care provider may diagnose a URI by:  Physical exam.  Tests to check that your symptoms are not due to another condition such as:  Strep  throat.  Sinusitis.  Pneumonia.  Asthma. TREATMENT  A URI goes away on its own with time. It cannot be cured with medicines, but medicines may be prescribed or recommended to relieve symptoms. Medicines may help:  Reduce your fever.  Reduce your cough.  Relieve nasal congestion. HOME CARE INSTRUCTIONS   Take medicines only as directed by your health care provider.   Gargle warm saltwater or take cough drops to comfort your throat as directed by your health care provider.  Use a warm mist humidifier or inhale steam from a shower to increase air moisture. This may make it easier to breathe.  Drink enough fluid to keep your urine clear or pale yellow.   Eat soups and other clear broths and maintain good nutrition.   Rest as needed.   Return to work when your temperature has returned to normal or as your health care provider advises. You may need to stay home longer to avoid infecting others. You can also use a face mask and careful hand washing to prevent spread of the virus.  Increase the usage of your inhaler if you have asthma.   Do not use any tobacco products, including cigarettes, chewing tobacco, or electronic cigarettes. If you need help quitting, ask your health care provider. PREVENTION  The best way to protect yourself from getting a cold is to practice good hygiene.   Avoid oral or hand contact with people with cold symptoms.   Wash your hands often if contact occurs.  There is no clear evidence that vitamin C, vitamin E, echinacea, or exercise reduces the chance of developing a cold. However, it is always recommended to get plenty of rest, exercise, and practice good nutrition.  SEEK MEDICAL CARE IF:   You are getting worse rather than better.   Your symptoms are not controlled by  medicine.   You have chills.  You have worsening shortness of breath.  You have brown or red mucus.  You have yellow or brown nasal discharge.  You have pain in your face, especially when you bend forward.  You have a fever.  You have swollen neck glands.  You have pain while swallowing.  You have white areas in the back of your throat. SEEK IMMEDIATE MEDICAL CARE IF:   You have severe or persistent:  Headache.  Ear pain.  Sinus pain.  Chest pain.  You have chronic lung disease and any of the following:  Wheezing.  Prolonged cough.  Coughing up blood.  A change in your usual mucus.  You have a stiff neck.  You have changes in your:  Vision.  Hearing.  Thinking.  Mood. MAKE SURE YOU:   Understand these instructions.  Will watch your condition.  Will get help right away if you are not doing well or get worse.   This information is not intended to replace advice given to you by your health care provider. Make sure you discuss any questions you have with your health care provider.   Document Released: 07/24/2000 Document Revised: 06/14/2014 Document Reviewed: 05/05/2013 Elsevier Interactive Patient Education 2016 Elsevier Inc.  Acute Bronchitis Bronchitis is inflammation of the airways that extend from the windpipe into the lungs (bronchi). The inflammation often causes mucus to develop. This leads to a cough, which is the most common symptom of bronchitis.  In acute bronchitis, the condition usually develops suddenly and goes away over time, usually in a couple weeks. Smoking, allergies, and asthma can make bronchitis worse. Repeated episodes of bronchitis  may cause further lung problems.  CAUSES Acute bronchitis is most often caused by the same virus that causes a cold. The virus can spread from person to person (contagious) through coughing, sneezing, and touching contaminated objects. SIGNS AND SYMPTOMS   Cough.   Fever.   Coughing up  mucus.   Body aches.   Chest congestion.   Chills.   Shortness of breath.   Sore throat.  DIAGNOSIS  Acute bronchitis is usually diagnosed through a physical exam. Your health care provider will also ask you questions about your medical history. Tests, such as chest X-rays, are sometimes done to rule out other conditions.  TREATMENT  Acute bronchitis usually goes away in a couple weeks. Oftentimes, no medical treatment is necessary. Medicines are sometimes given for relief of fever or cough. Antibiotic medicines are usually not needed but may be prescribed in certain situations. In some cases, an inhaler may be recommended to help reduce shortness of breath and control the cough. A cool mist vaporizer may also be used to help thin bronchial secretions and make it easier to clear the chest.  HOME CARE INSTRUCTIONS  Get plenty of rest.   Drink enough fluids to keep your urine clear or pale yellow (unless you have a medical condition that requires fluid restriction). Increasing fluids may help thin your respiratory secretions (sputum) and reduce chest congestion, and it will prevent dehydration.   Take medicines only as directed by your health care provider.  If you were prescribed an antibiotic medicine, finish it all even if you start to feel better.  Avoid smoking and secondhand smoke. Exposure to cigarette smoke or irritating chemicals will make bronchitis worse. If you are a smoker, consider using nicotine gum or skin patches to help control withdrawal symptoms. Quitting smoking will help your lungs heal faster.   Reduce the chances of another bout of acute bronchitis by washing your hands frequently, avoiding people with cold symptoms, and trying not to touch your hands to your mouth, nose, or eyes.   Keep all follow-up visits as directed by your health care provider.  SEEK MEDICAL CARE IF: Your symptoms do not improve after 1 week of treatment.  SEEK IMMEDIATE MEDICAL  CARE IF:  You develop an increased fever or chills.   You have chest pain.   You have severe shortness of breath.  You have bloody sputum.   You develop dehydration.  You faint or repeatedly feel like you are going to pass out.  You develop repeated vomiting.  You develop a severe headache. MAKE SURE YOU:   Understand these instructions.  Will watch your condition.  Will get help right away if you are not doing well or get worse.   This information is not intended to replace advice given to you by your health care provider. Make sure you discuss any questions you have with your health care provider.   Document Released: 03/07/2004 Document Revised: 02/18/2014 Document Reviewed: 07/21/2012 Elsevier Interactive Patient Education [employer]      I personally performed the services described in this documentation, which was scribed in my presence. The recorded information has been reviewed and considered, and addended by me as needed.      By signing my name below, I, Stann Ore, attest that this documentation has been prepared under the direction and in the presence of Meredith Staggers, MD. Electronically Signed: Stann Ore, Scribe. 01/17/2015 , 2:03 PM .

## 2015-10-16 ENCOUNTER — Emergency Department
Admission: EM | Admit: 2015-10-16 | Discharge: 2015-10-16 | Disposition: A | Discharge status: Home / Self Care | Payer: Self-pay | Attending: Emergency Medicine | Admitting: Emergency Medicine

## 2015-10-16 ENCOUNTER — Encounter: Payer: Self-pay | Admitting: Emergency Medicine

## 2015-10-16 ENCOUNTER — Emergency Department: Payer: Self-pay

## 2015-10-16 DIAGNOSIS — J45909 Unspecified asthma, uncomplicated: Secondary | ICD-10-CM | POA: Insufficient documentation

## 2015-10-16 DIAGNOSIS — Z87891 Personal history of nicotine dependence: Secondary | ICD-10-CM | POA: Insufficient documentation

## 2015-10-16 DIAGNOSIS — R109 Unspecified abdominal pain: Secondary | ICD-10-CM

## 2015-10-16 DIAGNOSIS — R112 Nausea with vomiting, unspecified: Secondary | ICD-10-CM

## 2015-10-16 DIAGNOSIS — Z792 Long term (current) use of antibiotics: Secondary | ICD-10-CM | POA: Insufficient documentation

## 2015-10-16 DIAGNOSIS — F1290 Cannabis use, unspecified, uncomplicated: Secondary | ICD-10-CM | POA: Insufficient documentation

## 2015-10-16 DIAGNOSIS — K2970 Gastritis, unspecified, without bleeding: Secondary | ICD-10-CM | POA: Insufficient documentation

## 2015-10-16 DIAGNOSIS — R1010 Upper abdominal pain, unspecified: Secondary | ICD-10-CM

## 2015-10-16 LAB — CBC
HEMATOCRIT: 49.4 % (ref 40.0–52.0)
Hemoglobin: 16.9 g/dL (ref 13.0–18.0)
MCH: 30.6 pg (ref 26.0–34.0)
MCHC: 34.2 g/dL (ref 32.0–36.0)
MCV: 89.6 fL (ref 80.0–100.0)
Platelets: 326 10*3/uL (ref 150–440)
RBC: 5.51 MIL/uL (ref 4.40–5.90)
RDW: 14.2 % (ref 11.5–14.5)
WBC: 22.1 10*3/uL — ABNORMAL HIGH (ref 3.8–10.6)

## 2015-10-16 LAB — COMPREHENSIVE METABOLIC PANEL
ALT: 25 U/L (ref 17–63)
AST: 32 U/L (ref 15–41)
Albumin: 4.9 g/dL (ref 3.5–5.0)
Alkaline Phosphatase: 51 U/L (ref 38–126)
Anion gap: 8 (ref 5–15)
BUN: 16 mg/dL (ref 6–20)
CHLORIDE: 105 mmol/L (ref 101–111)
CO2: 28 mmol/L (ref 22–32)
Calcium: 10.1 mg/dL (ref 8.9–10.3)
Creatinine, Ser: 1.37 mg/dL — ABNORMAL HIGH (ref 0.61–1.24)
GFR, EST NON AFRICAN AMERICAN: 59 mL/min — AB (ref >60)
Glucose, Bld: 190 mg/dL — ABNORMAL HIGH (ref 65–99)
POTASSIUM: 3.8 mmol/L (ref 3.5–5.1)
SODIUM: 141 mmol/L (ref 135–145)
Total Bilirubin: 0.7 mg/dL (ref 0.3–1.2)
Total Protein: 8.4 g/dL — ABNORMAL HIGH (ref 6.5–8.1)

## 2015-10-16 LAB — LIPASE, BLOOD: LIPASE: 22 U/L (ref 11–51)

## 2015-10-16 MED ORDER — SUCRALFATE 1 G PO TABS: 1.0000 g | ORAL_TABLET | Freq: Two times a day (BID) | ORAL | 0 refills | Status: DC

## 2015-10-16 MED ORDER — SODIUM CHLORIDE 0.9 % IV BOLUS (SEPSIS)
1000.0000 mL | Freq: Once | INTRAVENOUS | Status: AC
  Administered 2015-10-16: 1000 mL via INTRAVENOUS

## 2015-10-16 MED ORDER — IOPAMIDOL (ISOVUE-300) INJECTION 61%
30.0000 mL | Freq: Once | INTRAVENOUS | Status: AC | PRN
  Administered 2015-10-16: 30 mL via ORAL

## 2015-10-16 MED ORDER — GI COCKTAIL ~~LOC~~
30.0000 mL | Freq: Once | ORAL | Status: AC
  Administered 2015-10-16: 30 mL via ORAL
  Filled 2015-10-16: qty 30

## 2015-10-16 MED ORDER — ONDANSETRON HCL 4 MG/2ML IJ SOLN
INTRAMUSCULAR | Status: AC
  Filled 2015-10-16: qty 2

## 2015-10-16 MED ORDER — MORPHINE SULFATE (PF) 2 MG/ML IV SOLN
INTRAVENOUS | Status: AC
  Filled 2015-10-16: qty 2

## 2015-10-16 MED ORDER — IOPAMIDOL (ISOVUE-300) INJECTION 61%
100.0000 mL | Freq: Once | INTRAVENOUS | Status: AC | PRN
  Administered 2015-10-16: 100 mL via INTRAVENOUS

## 2015-10-16 MED ORDER — KETOROLAC TROMETHAMINE 30 MG/ML IJ SOLN
30.0000 mg | Freq: Once | INTRAMUSCULAR | Status: AC
  Administered 2015-10-16: 30 mg via INTRAVENOUS
  Filled 2015-10-16: qty 1

## 2015-10-16 MED ORDER — ONDANSETRON HCL 4 MG/2ML IJ SOLN
4.0000 mg | Freq: Once | INTRAMUSCULAR | Status: AC
  Administered 2015-10-16: 4 mg via INTRAVENOUS

## 2015-10-16 MED ORDER — MORPHINE SULFATE (PF) 4 MG/ML IV SOLN
4.0000 mg | Freq: Once | INTRAVENOUS | Status: AC
  Administered 2015-10-16: 4 mg via INTRAVENOUS
  Filled 2015-10-16: qty 1

## 2015-10-16 MED ORDER — PROMETHAZINE HCL 25 MG RE SUPP: 25.0000 mg | Freq: Four times a day (QID) | RECTAL | 0 refills | Status: DC | PRN

## 2015-10-16 MED ORDER — SODIUM CHLORIDE 0.9 % IV BOLUS (SEPSIS)
1000.0000 mL | Freq: Once | INTRAVENOUS | Status: AC
Start: 2015-10-16 — End: 2015-10-16
  Administered 2015-10-16: 1000 mL via INTRAVENOUS

## 2015-10-16 MED ORDER — METOCLOPRAMIDE HCL 10 MG PO TABS: 10.0000 mg | ORAL_TABLET | Freq: Three times a day (TID) | ORAL | 0 refills | Status: DC | PRN

## 2015-10-16 MED ORDER — MORPHINE SULFATE (PF) 4 MG/ML IV SOLN
4.0000 mg | Freq: Once | INTRAVENOUS | Status: AC
  Administered 2015-10-16: 4 mg via INTRAVENOUS

## 2015-10-16 NOTE — ED Notes (Signed)
Pt yelling in room, "please get me something for pain, take it away, i'm like to dyin'". Pt restless, cardiac monitor. Dr. Zenda AlpersWebster notified regarding pt's presentation and complaint. Order for zofran and morphine received.

## 2015-10-16 NOTE — ED Notes (Signed)
Dr. Webster in to reassess.  

## 2015-10-16 NOTE — ED Notes (Signed)
Pt encouraged to leave arm straight to received ivf.

## 2015-10-16 NOTE — ED Notes (Signed)
Pt states is having increased abd pain, will notify md.

## 2015-10-16 NOTE — ED Notes (Signed)
md informed of pt's request for additional pain medication. md states will need to await ct results before further pain medication. Pt informed of md's statement.

## 2015-10-16 NOTE — ED Notes (Signed)
Patient transported to CT 

## 2015-10-16 NOTE — ED Notes (Signed)
Pt states pain is increased again. Pt asking for dilaudid by name for pain. Pt notified will notify md regarding request for additional pain medication.

## 2015-10-16 NOTE — ED Triage Notes (Signed)
Pt presents to ED with epigastric pain and vomiting. Pt states he is dehydrated and needs some fluids. Pt reports similar symptoms about a year ago and they told him it was food poisoning. Seems to always happen when he is drinking beer. Pt states he has never followed up with GI. Pt restless in triage.

## 2015-10-16 NOTE — ED Notes (Signed)
Patient ambulatory through the front door. Upon approaching the desk, he immediately squats to the floor and starts moaning about abdominal pain that he has had all day. States it is unsure if he has food poisoning or not. When his family member starts to talk to him he stops moaning and talks to his family then returns to moaning.

## 2015-10-16 NOTE — ED Provider Notes (Signed)
[facility] Emergency Department Provider Note   ____________________________________________   First MD Initiated Contact with Patient 10/16/15 0154     (approximate)  I have reviewed the triage vital signs and the nursing notes.   HISTORY  Chief Complaint Abdominal Pain and Emesis    HPI Devin Chan is a 50 y.o. male who comes into the hospital today after being sick all day. He reports that every time he eats or drinks something he vomits. The patient also has some upper abdominal pain. He reports that he tried Zofran but it didn't help. He reports that the pain is in his upper right side. He thinks he is dehydrated because he vomited so much as muscle cramping. He also reports that he try another medication brought in by his cousin called Emetrol but also did not help. The patient did have a fever but is unsure what the temperature was. He's had cold chills and sweats. The patient rates pain a 5 out of 10 in intensity currently. He has some diarrhea earlier today but is better. The patient reports that he had similar episode previously but was told it was due to food poisoning. The patient drinks 2-3 beers a day.   Past Medical History:  Diagnosis Date  . Arthritis   . Asthma   . Depression     Patient Active Problem List   Diagnosis Date Noted  . Upper GI bleed 01/02/2014  . Hematemesis 01/02/2014  . Epigastric pain     Past Surgical History:  Procedure Laterality Date  . CLEFT PALATE REPAIR  age 61   [facility]  . NO PAST SURGERIES      Prior to Admission medications   Medication Sig Start Date End Date Taking? Authorizing Provider  Albuterol Sulfate (PROAIR RESPICLICK) 108 (90 BASE) MCG/ACT AEPB Inhale 1 Inhaler into the lungs every 4 (four) hours as needed. For wheezing or shortness of breath 01/17/15   Shade Flood, MD  azithromycin (ZITHROMAX) 250 MG tablet Take 2 pills by mouth on day 1, then 1 pill by mouth per day on days 2  through 5. 01/17/15   Shade Flood, MD  metoCLOPramide (REGLAN) 10 MG tablet Take 1 tablet (10 mg total) by mouth every 8 (eight) hours as needed. 10/16/15   Rebecka Apley, MD  promethazine (PHENERGAN) 25 MG suppository Place 1 suppository (25 mg total) rectally every 6 (six) hours as needed for nausea or vomiting. 10/16/15   Rebecka Apley, MD  sucralfate (CARAFATE) 1 g tablet Take 1 tablet (1 g total) by mouth 2 (two) times daily. 10/16/15   Rebecka Apley, MD    Allergies Review of patient's allergies indicates no known allergies.  Family History  Problem Relation Age of Onset  . Drug abuse Father   . Stroke Father   . Hypertension Father     Social History Social History  Substance Use Topics  . Smoking status: Former Smoker    Packs/day: 1.00    Years: 30.00    Types: Cigarettes    Quit date: 05/01/2011  . Smokeless tobacco: Never Used     Comment: quit x 6 months at age 32  . Alcohol use 16.2 oz/week    15 Cans of beer, 12 Standard drinks or equivalent per week    Review of Systems Constitutional:  fever/chills Eyes: No visual changes. ENT: No sore throat. Cardiovascular: Denies chest pain. Respiratory: Denies shortness of breath. Gastrointestinal:  abdominal pain, nausea, vomiting,  diarrhea.  No constipation. Genitourinary: Negative for dysuria. Musculoskeletal: Negative for back pain. Skin: Negative for rash. Neurological: Negative for headaches, focal weakness or numbness.  10-point ROS otherwise negative.  ____________________________________________   PHYSICAL EXAM:  VITAL SIGNS: ED Triage Vitals  Enc Vitals Group     BP 10/16/15 0043 (!) 189/118     Pulse Rate 10/16/15 0043 (!) 104     Resp 10/16/15 0043 (!) 24     Temp 10/16/15 0043 97.5 F (36.4 C)     Temp Source 10/16/15 0043 Oral     SpO2 10/16/15 0043 100 %     Weight 10/16/15 0043 164 lb (74.4 kg)     Height 10/16/15 0043 5\' 6"  (1.676 m)     Head Circumference --      Peak Flow --       Pain Score 10/16/15 0044 10     Pain Loc --      Pain Edu? --      Excl. in GC? --     Constitutional: Alert and oriented. Well appearing and in moderate distress. Eyes: Conjunctivae are normal. PERRL. EOMI. Head: Atraumatic. Nose: No congestion/rhinnorhea. Mouth/Throat: Mucous membranes are moist.  Oropharynx non-erythematous. Cardiovascular: Normal rate, regular rhythm. Grossly normal heart sounds.  Good peripheral circulation. Respiratory: Normal respiratory effort.  No retractions. Lungs CTAB. Gastrointestinal: Soft with RUQ and epigastric tenderness to palpation. No distention. Positive bowel sounds Musculoskeletal: No lower extremity tenderness nor edema.   Neurologic:  Normal speech and language.  Skin:  Skin is warm, dry and intact.  Psychiatric: Mood and affect are normal.   ____________________________________________   LABS (all labs ordered are listed, but only abnormal results are displayed)  Labs Reviewed  COMPREHENSIVE METABOLIC PANEL - Abnormal; Notable for the following:       Result Value   Glucose, Bld 190 (*)    Creatinine, Ser 1.37 (*)    Total Protein 8.4 (*)    GFR calc non Af Amer 59 (*)    All other components within normal limits  CBC - Abnormal; Notable for the following:    WBC 22.1 (*)    All other components within normal limits  LIPASE, BLOOD  URINALYSIS COMPLETEWITH MICROSCOPIC ([facility] ONLY)   ____________________________________________  EKG  ED ECG REPORT I, Rebecka Apley, the attending physician, personally viewed and interpreted this ECG.   Date: 10/16/2015  EKG Time: 116  Rate: 85  Rhythm: normal sinus rhythm  Axis: normal  Intervals:none  ST&T Change: none  ____________________________________________  RADIOLOGY  [place] abd RUQ CT abd and pelvis ____________________________________________   PROCEDURES  Procedure(s) performed: None  Procedures  Critical Care performed:  No  ____________________________________________   INITIAL IMPRESSION / ASSESSMENT AND PLAN / ED COURSE  Pertinent labs & imaging results that were available during my care of the patient were reviewed by me and considered in my medical decision making (see chart for details).  This is a 50 year old male who comes into the hospital today with right upper quadrant pain, vomiting and fever. The patient did receive a dose of morphine and Zofran. He will receive a CT scan of his right upper quadrant to evaluate his gallbladder. He will also receive a liter and I will reassess the patient once I received the results of his ultrasound.  Clinical Course  Value Comment By Time  US Abdomen Limited RUQ Unremarkable right upper quadrant ultrasound Rebecka Apley, MD 09/04 [phone]  CT Abdomen Pelvis W Contrast 1. No  acute abnormality seen within the abdomen or pelvis. 2. Scattered calcification along the abdominal aorta and its branches. 3. Contrast along the distal esophagus, raising question for gastroesophageal reflux or esophageal dysmotility. 4. Small left renal cysts noted. 5. Chronic bilateral pars defects at L5, without evidence of anterolisthesis.   Rebecka ApleyAllison P Webster, MD 09/04 [phone]   The patient did receive a second dose of morphine prior to his CT scan. He received a GI cocktail for burning pain and then he received some Toradol. The patient did have 2 L of normal saline. He feels much more comfortable at this time. He informed me that he felt some of the pain started when he was drinking and has had this problem with eating spicy foods before. I informed him that he should not eat spicy foods and that he should probably stop drinking. The patient needs to follow up with the GI physician for further evaluation. Otherwise the patient has no further concerns and his CT scan and ultrasound are negative. I feel that his elevated white blood cell count was due to an acute phase reactant with his  severe vomiting as well as his dehydration. The patient's symptoms at this time are improved. He will be discharged home to follow-up.  ____________________________________________   FINAL CLINICAL IMPRESSION(S) / ED DIAGNOSES  Final diagnoses:  Abdominal pain  Gastritis  Pain of upper abdomen  Non-intractable vomiting with nausea, vomiting of unspecified type      NEW MEDICATIONS STARTED DURING THIS VISIT:  New Prescriptions   METOCLOPRAMIDE (REGLAN) 10 MG TABLET    Take 1 tablet (10 mg total) by mouth every 8 (eight) hours as needed.   PROMETHAZINE (PHENERGAN) 25 MG SUPPOSITORY    Place 1 suppository (25 mg total) rectally every 6 (six) hours as needed for nausea or vomiting.   SUCRALFATE (CARAFATE) 1 G TABLET    Take 1 tablet (1 g total) by mouth 2 (two) times daily.     Note:  This document was prepared using Dragon voice recognition software and may include unintentional dictation errors.    Rebecka ApleyAllison P Webster, MD 10/16/15 [phone]

## 2015-10-16 NOTE — ED Notes (Signed)
Patient transported to Ultrasound 

## 2015-11-21 ENCOUNTER — Encounter: Payer: Self-pay | Admitting: Gastroenterology

## 2015-11-21 ENCOUNTER — Ambulatory Visit (INDEPENDENT_AMBULATORY_CARE_PROVIDER_SITE_OTHER): Payer: Self-pay | Admitting: Gastroenterology

## 2015-11-21 ENCOUNTER — Other Ambulatory Visit: Payer: Self-pay

## 2015-11-21 VITALS — BP 143/80 | HR 67 | Temp 98.1°F | Ht 65.5 in | Wt 161.0 lb

## 2015-11-21 DIAGNOSIS — K2990 Gastroduodenitis, unspecified, without bleeding: Secondary | ICD-10-CM

## 2015-11-21 DIAGNOSIS — K2970 Gastritis, unspecified, without bleeding: Secondary | ICD-10-CM

## 2015-11-21 DIAGNOSIS — G8929 Other chronic pain: Secondary | ICD-10-CM

## 2015-11-21 DIAGNOSIS — R1011 Right upper quadrant pain: Secondary | ICD-10-CM

## 2015-11-21 MED ORDER — PEG 3350-KCL-NABCB-NACL-NASULF 236 G PO SOLR: 4000.0000 mL | Freq: Once | ORAL | 0 refills | Status: AC

## 2015-11-21 NOTE — Patient Instructions (Signed)
You are scheduled for a colonoscopy at [facility] on Tuesday, Oct 17th. You were given prep instructions and a prescription for a bowel prep solution. Please go to your pharmacy and fill this prior to your procedure.   You will also be scheduled for a HIDA scan. I will contact you with this information tomorrow.

## 2015-11-21 NOTE — Progress Notes (Signed)
Gastroenterology Consultation  Referring Provider:     No ref. provider found Primary Care Physician:  No PCP Per Patient Primary Gastroenterologist:  Dr. Servando Snare     Reason for Consultation:     Right-sided abdominal pain        HPI:   Devin Chan is a 50 y.o. y/o male referred for consultation & management of Right-sided abdominal pain by Dr. Bonnetta Barry PCP Per Patient.  This patient comes in today with a history of right-sided abdominal pain.  The patient was in the emergency department and was told he had gastritis.  The patient also had nausea and was given Phenergan.  His symptoms have improved but he still has some right-sided abdominal pain.  There is no report of any unexplained weight loss fevers chills or vomiting. The patient also reports that he has had worse pain when he eats fast food or fatty and greasy foods.  The patient also reports that he had diarrhea prior to going to the emergency room but states that his resolved.  The patient now drinks approximately 2-3 beers a day but states he used to drink much more.  Past Medical History:  Diagnosis Date  . Arthritis   . Asthma   . Depression     Past Surgical History:  Procedure Laterality Date  . CLEFT PALATE REPAIR  age 89   [facility]  . NO PAST SURGERIES      Prior to Admission medications   Medication Sig Start Date End Date Taking? Authorizing Provider  Albuterol Sulfate (PROAIR RESPICLICK) 108 (90 BASE) MCG/ACT AEPB Inhale 1 Inhaler into the lungs every 4 (four) hours as needed. For wheezing or shortness of breath Patient not taking: Reported on 11/21/2015 01/17/15   Shade Flood, MD  azithromycin (ZITHROMAX) 250 MG tablet Take 2 pills by mouth on day 1, then 1 pill by mouth per day on days 2 through 5. Patient not taking: Reported on 11/21/2015 01/17/15   Shade Flood, MD  metoCLOPramide (REGLAN) 10 MG tablet Take 1 tablet (10 mg total) by mouth every 8 (eight) hours as needed. Patient not taking: Reported on  11/21/2015 10/16/15   Rebecka Apley, MD  polyethylene glycol (GOLYTELY) 236 g solution Take 4,000 mLs by mouth once. Drink one 8 oz glass every 20 mins until stools are clear Patient not taking: Reported on 11/21/2015 11/21/15 11/21/15  Midge Minium, MD  promethazine (PHENERGAN) 25 MG suppository Place 1 suppository (25 mg total) rectally every 6 (six) hours as needed for nausea or vomiting. Patient not taking: Reported on 11/21/2015 10/16/15   Rebecka Apley, MD  sucralfate (CARAFATE) 1 g tablet Take 1 tablet (1 g total) by mouth 2 (two) times daily. Patient not taking: Reported on 11/21/2015 10/16/15   Rebecka Apley, MD    Family History  Problem Relation Age of Onset  . Drug abuse Father   . Stroke Father   . Hypertension Father      Social History  Substance Use Topics  . Smoking status: Former Smoker    Packs/day: 1.00    Years: 30.00    Types: Cigarettes    Quit date: 05/01/2011  . Smokeless tobacco: Never Used     Comment: quit x 6 months at age 53  . Alcohol use 16.2 oz/week    15 Cans of beer, 12 Standard drinks or equivalent per week    Allergies as of 11/21/2015  . (No Known Allergies)    Review  of Systems:    All systems reviewed and negative except where noted in HPI.   Physical Exam:  BP (!) 143/80   Pulse 67   Temp 98.1 F (36.7 C) (Oral)   Ht 5' 5.5" (1.664 m)   Wt 161 lb (73 kg)   BMI 26.38 kg/m  No LMP for male patient. Psych:  Alert and cooperative. Normal mood and affect. General:   Alert,  Well-developed, well-nourished, pleasant and cooperative in NAD Head:  Normocephalic and atraumatic. Eyes:  Sclera clear, no icterus.   Conjunctiva pink. Ears:  Normal auditory acuity. Nose:  No deformity, discharge, or lesions. Mouth:  No deformity or lesions,oropharynx pink & moist. Neck:  Supple; no masses or thyromegaly. Lungs:  Respirations even and unlabored.  Clear throughout to auscultation.   No wheezes, crackles, or rhonchi. No acute  distress. Heart:  Regular rate and rhythm; no murmurs, clicks, rubs, or gallops. Abdomen:  Normal bowel sounds.  No bruits.  Soft, non-tender and non-distended without masses, hepatosplenomegaly or hernias noted.  No guarding or rebound tenderness.  Negative Carnett sign.   Rectal:  Deferred.  Msk:  Symmetrical without gross deformities.  Good, equal movement & strength bilaterally. Pulses:  Normal pulses noted. Extremities:  No clubbing or edema.  No cyanosis. Neurologic:  Alert and oriented x3;  grossly normal neurologically. Skin:  Intact without significant lesions or rashes.  No jaundice. Lymph Nodes:  No significant cervical adenopathy. Psych:  Alert and cooperative. Normal mood and affect.  Imaging Studies: No results found.  Assessment and Plan:   Devin Chan is a 50 y.o. y/o male who was in the emergency room for abdominal pain and  Nausea with diarrhea. The patient states that all of his symptoms have improved except he continues to have the abdominal pain the right side when he eats fatty or greasy foods.  The patient will be set up for a HIDA scan  With [facility].  He'll also be set up for colonoscopy due to his age.  The patient has been explained the plan and agrees with it.   Note: This dictation was prepared with Dragon dictation along with smaller phrase technology. Any transcriptional errors that result from this process are unintentional.

## 2015-11-22 ENCOUNTER — Telehealth: Payer: Self-pay

## 2015-11-22 ENCOUNTER — Other Ambulatory Visit: Payer: Self-pay

## 2015-11-22 NOTE — Telephone Encounter (Signed)
Pt scheduled for HIDA scan at [facility], [place] location on Wednesday, Oct 25th @ 7:30am. Pt has been instructed to arrive at 7:15am and be NPO. Pt verbalized understanding of these instructions.

## 2015-11-28 ENCOUNTER — Encounter: Admission: RE | Payer: Self-pay | Source: Ambulatory Visit

## 2015-11-28 ENCOUNTER — Ambulatory Visit: Admission: RE | Admit: 2015-11-28 | Payer: Self-pay | Source: Ambulatory Visit | Admitting: Gastroenterology

## 2015-11-28 SURGERY — COLONOSCOPY WITH PROPOFOL
Anesthesia: General

## 2015-12-06 ENCOUNTER — Ambulatory Visit: Payer: Self-pay

## 2018-01-17 ENCOUNTER — Emergency Department (HOSPITAL_COMMUNITY): Payer: Self-pay

## 2018-01-17 ENCOUNTER — Emergency Department (HOSPITAL_COMMUNITY)
Admission: EM | Admit: 2018-01-17 | Discharge: 2018-01-17 | Disposition: A | Discharge status: Home / Self Care | Payer: Self-pay | Attending: Emergency Medicine | Admitting: Emergency Medicine

## 2018-01-17 DIAGNOSIS — K209 Esophagitis, unspecified without bleeding: Secondary | ICD-10-CM

## 2018-01-17 DIAGNOSIS — R1011 Right upper quadrant pain: Secondary | ICD-10-CM | POA: Insufficient documentation

## 2018-01-17 DIAGNOSIS — Z87891 Personal history of nicotine dependence: Secondary | ICD-10-CM | POA: Insufficient documentation

## 2018-01-17 DIAGNOSIS — J45909 Unspecified asthma, uncomplicated: Secondary | ICD-10-CM | POA: Insufficient documentation

## 2018-01-17 LAB — CBC WITH DIFFERENTIAL/PLATELET
ABS IMMATURE GRANULOCYTES: 0.08 10*3/uL — AB (ref 0.00–0.07)
BASOS PCT: 0 %
Basophils Absolute: 0 10*3/uL (ref 0.0–0.1)
EOS ABS: 0 10*3/uL (ref 0.0–0.5)
Eosinophils Relative: 0 %
HCT: 52.5 % — ABNORMAL HIGH (ref 39.0–52.0)
Hemoglobin: 17.4 g/dL — ABNORMAL HIGH (ref 13.0–17.0)
Immature Granulocytes: 1 %
Lymphocytes Relative: 5 %
Lymphs Abs: 0.9 10*3/uL (ref 0.7–4.0)
MCH: 31.5 pg (ref 26.0–34.0)
MCHC: 33.1 g/dL (ref 30.0–36.0)
MCV: 94.9 fL (ref 80.0–100.0)
MONO ABS: 0.9 10*3/uL (ref 0.1–1.0)
MONOS PCT: 5 %
NEUTROS ABS: 15.3 10*3/uL — AB (ref 1.7–7.7)
NEUTROS PCT: 89 %
PLATELETS: 346 10*3/uL (ref 150–400)
RBC: 5.53 MIL/uL (ref 4.22–5.81)
RDW: 13.4 % (ref 11.5–15.5)
WBC: 17.2 10*3/uL — AB (ref 4.0–10.5)
nRBC: 0 % (ref 0.0–0.2)

## 2018-01-17 LAB — URINALYSIS, ROUTINE W REFLEX MICROSCOPIC
BILIRUBIN URINE: NEGATIVE
Bacteria, UA: NONE SEEN
GLUCOSE, UA: NEGATIVE mg/dL
KETONES UR: 20 mg/dL — AB
LEUKOCYTES UA: NEGATIVE
NITRITE: NEGATIVE
PH: 7 (ref 5.0–8.0)
PROTEIN: 100 mg/dL — AB
Specific Gravity, Urine: >1.046 — ABNORMAL HIGH (ref 1.005–1.030)

## 2018-01-17 LAB — COMPREHENSIVE METABOLIC PANEL
ALT: 18 U/L (ref 0–44)
ANION GAP: 8 (ref 5–15)
AST: 21 U/L (ref 15–41)
Albumin: 4.5 g/dL (ref 3.5–5.0)
Alkaline Phosphatase: 47 U/L (ref 38–126)
BUN: 16 mg/dL (ref 6–20)
CHLORIDE: 105 mmol/L (ref 98–111)
CO2: 28 mmol/L (ref 22–32)
Calcium: 8.9 mg/dL (ref 8.9–10.3)
Creatinine, Ser: 1.07 mg/dL (ref 0.61–1.24)
Glucose, Bld: 134 mg/dL — ABNORMAL HIGH (ref 70–99)
POTASSIUM: 3.7 mmol/L (ref 3.5–5.1)
SODIUM: 141 mmol/L (ref 135–145)
Total Bilirubin: 1.1 mg/dL (ref 0.3–1.2)
Total Protein: 7.7 g/dL (ref 6.5–8.1)

## 2018-01-17 LAB — LIPASE, BLOOD: LIPASE: 36 U/L (ref 11–51)

## 2018-01-17 MED ORDER — SODIUM CHLORIDE (PF) 0.9 % IJ SOLN
INTRAMUSCULAR | Status: AC
  Filled 2018-01-17: qty 50

## 2018-01-17 MED ORDER — ONDANSETRON HCL 4 MG/2ML IJ SOLN
4.0000 mg | Freq: Once | INTRAMUSCULAR | Status: AC
  Administered 2018-01-17: 4 mg via INTRAVENOUS
  Filled 2018-01-17: qty 2

## 2018-01-17 MED ORDER — OMEPRAZOLE 20 MG PO CPDR: 20.0000 mg | DELAYED_RELEASE_CAPSULE | Freq: Every day | ORAL | 0 refills | Status: DC

## 2018-01-17 MED ORDER — SODIUM CHLORIDE 0.9 % IV BOLUS
1000.0000 mL | Freq: Once | INTRAVENOUS | Status: AC
  Administered 2018-01-17: 1000 mL via INTRAVENOUS

## 2018-01-17 MED ORDER — IOPAMIDOL (ISOVUE-300) INJECTION 61%
INTRAVENOUS | Status: AC
  Filled 2018-01-17: qty 100

## 2018-01-17 MED ORDER — HYDROMORPHONE HCL 1 MG/ML IJ SOLN
0.5000 mg | Freq: Once | INTRAMUSCULAR | Status: AC
  Administered 2018-01-17: 0.5 mg via INTRAVENOUS
  Filled 2018-01-17: qty 1

## 2018-01-17 MED ORDER — LIDOCAINE VISCOUS HCL 2 % MT SOLN
15.0000 mL | Freq: Once | OROMUCOSAL | Status: AC
  Administered 2018-01-17: 15 mL via ORAL
  Filled 2018-01-17: qty 15

## 2018-01-17 MED ORDER — IOPAMIDOL (ISOVUE-300) INJECTION 61%
100.0000 mL | Freq: Once | INTRAVENOUS | Status: AC | PRN
  Administered 2018-01-17: 100 mL via INTRAVENOUS

## 2018-01-17 MED ORDER — MORPHINE SULFATE (PF) 4 MG/ML IV SOLN
4.0000 mg | Freq: Once | INTRAVENOUS | Status: AC
  Administered 2018-01-17: 4 mg via INTRAVENOUS
  Filled 2018-01-17: qty 1

## 2018-01-17 MED ORDER — ONDANSETRON HCL 4 MG PO TABS: 4.0000 mg | ORAL_TABLET | Freq: Four times a day (QID) | ORAL | 0 refills | Status: DC

## 2018-01-17 MED ORDER — ALUM & MAG HYDROXIDE-SIMETH 200-200-20 MG/5ML PO SUSP
30.0000 mL | Freq: Once | ORAL | Status: AC
  Administered 2018-01-17: 30 mL via ORAL
  Filled 2018-01-17: qty 30

## 2018-01-17 NOTE — Discharge Instructions (Signed)
Your evaluated today for right upper quadrant abdominal pain as well as nausea and vomiting.  Your CT scan showed you could have possible esophagitis, this could be from multiple episodes of vomiting.  The remaining portion of your CT scan was negative.  Laboratory work was negative while in the department.  I have prescribed you omeprazole.  Please take as prescribed.  I have also referred you to gastroenterology.  Please follow-up with them.  Their number is listed on your discharge paperwork, you will need to call them to schedule an appointment.  Your blood pressure was elevated during your emergency department visit.  I have referred you to a primary care office.  Please follow-up with them for reevaluation of your blood pressure.

## 2018-01-17 NOTE — ED Provider Notes (Signed)
[facility]-EMERGENCY DEPT Provider Note   CSN: [id number] Arrival date & time: 01/17/18  0805   History   Chief Complaint Chief Complaint  Patient presents with  . Abdominal Pain    rt side     HPI Devin Chan is a 52 y.o. male with past medical history significant for depression, asthma, history of upper GI bleed, chronic epigastric pain who presents for evaluation of right upper quadrant pain.  Patient states this pain has been intermittent over the last 3 days.  Patient states pain started worsening approximately 4 PM yesterday.  States pain is worse when he eats.  Has had multiple episodes of nonbloody, nonbilious emesis.  Patient did state he had one episode of nonbloody diarrhea yesterday around 4 PM.  Patient has not taken anything for symptoms PTA.  Patient states he was able to keep down a "chicken biscuit" yesterday however is not able to keep down [place] stew.  Patient states he did feel warm when he was in a "really hot bath yesterday evening."  Did not take his temperature.  Denies chills, cough, hemoptysis, blood in emesis, pain, shortness of breath, dysuria, constipation.  States pain is located to the epigastric and right upper quadrant.  Had similar episodes in the past and was seen by GI.  Had negative ultrasound at that time and was scheduled for HIDA scan as well as colonoscopy.  Patient states he did not follow-up.  Patient states he had "cabbage juice" which resolved his symptoms.  He does take Zantac daily for acid reflux.  Denies NSAID use, hx of H.Pylori.  Denies previous abdominal surgeries.  Last oral intake yesterday evening.  Admits to alcohol use, 3 beers a day.  History obtained from patient.  No interpreter was used.  HPI  Past Medical History:  Diagnosis Date  . Arthritis   . Asthma   . Depression     Patient Active Problem List   Diagnosis Date Noted  . Upper GI bleed 01/02/2014  . Hematemesis 01/02/2014  . Epigastric  pain     Past Surgical History:  Procedure Laterality Date  . CLEFT PALATE REPAIR  age 76   [facility]  . NO PAST SURGERIES          Home Medications    Prior to Admission medications   Medication Sig Start Date End Date Taking? Authorizing Provider  Misc Natural Products (NF FORMULAS TESTOSTERONE PO) Take 1 capsule by mouth daily.   Yes [provider]  ranitidine (ZANTAC) 150 MG tablet Take 150 mg by mouth 2 (two) times daily.   Yes [provider]  omeprazole (PRILOSEC) 20 MG capsule Take 1 capsule (20 mg total) by mouth daily. 01/17/18   Amogh Komatsu A, PA-C  ondansetron (ZOFRAN) 4 MG tablet Take 1 tablet (4 mg total) by mouth every 6 (six) hours. 01/17/18   Malcolm Hetz A, PA-C    Family History Family History  Problem Relation Age of Onset  . Drug abuse Father   . Stroke Father   . Hypertension Father     Social History Social History   Tobacco Use  . Smoking status: Former Smoker    Packs/day: 1.00    Years: 30.00    Pack years: 30.00    Types: Cigarettes    Last attempt to quit: 05/01/2011    Years since quitting: 6.7  . Smokeless tobacco: Never Used  . Tobacco comment: quit x 6 months at age 63  Substance Use  Topics  . Alcohol use: Yes    Alcohol/week: 27.0 standard drinks    Types: 15 Cans of beer, 12 Standard drinks or equivalent per week  . Drug use: Yes    Types: Marijuana     Allergies   Patient has no known allergies.   Review of Systems Review of Systems  Constitutional: Negative.   HENT: Negative.   Respiratory: Negative.   Cardiovascular: Negative.   Gastrointestinal: Positive for abdominal pain, diarrhea, nausea and vomiting. Negative for abdominal distention, anal bleeding, blood in stool, constipation and rectal pain.  Genitourinary: Negative.   Musculoskeletal: Negative.   Skin: Negative.   Neurological: Negative for dizziness, facial asymmetry, weakness and light-headedness.  All other systems reviewed and are  negative.    Physical Exam Updated Vital Signs BP (!) 140/98 (BP Location: Left Arm)   Pulse 85   Temp 98.5 F (36.9 C) (Oral)   Resp 15   Ht 5\' 6"  (1.676 m)   Wt 65.8 kg   SpO2 95%   BMI 23.40 kg/m   Physical Exam  Constitutional: He appears well-developed and well-nourished.  Non-toxic appearance. He does not appear ill. No distress.  HENT:  Head: Atraumatic.  Mouth/Throat: Oropharynx is clear and moist.  Eyes: Pupils are equal, round, and reactive to light.  Neck: Normal range of motion. Neck supple.  Cardiovascular: Normal rate and regular rhythm.  Pulmonary/Chest: Effort normal. No respiratory distress.  Abdominal: Soft. Normal appearance. He exhibits no distension. There is generalized tenderness and tenderness in the right upper quadrant. There is negative Murphy's sign.  Tenderness to right upper quadrant and epigastric region. Negative Murphy sign. No rebound, rigidity. Mild guarding which is distractible. No obvious hernia.  Musculoskeletal: Normal range of motion.  Neurological: He is alert.  Skin: Skin is warm and dry. He is not diaphoretic.  No rashes, erythema, warmth.  Psychiatric: He has a normal mood and affect.  Nursing note and vitals reviewed.    ED Treatments / Results  Labs (all labs ordered are listed, but only abnormal results are displayed) Labs Reviewed  CBC WITH DIFFERENTIAL/PLATELET - Abnormal; Notable for the following components:      Result Value   WBC 17.2 (*)    Hemoglobin 17.4 (*)    HCT 52.5 (*)    Neutro Abs 15.3 (*)    Abs Immature Granulocytes 0.08 (*)    All other components within normal limits  COMPREHENSIVE METABOLIC PANEL - Abnormal; Notable for the following components:   Glucose, Bld 134 (*)    All other components within normal limits  URINALYSIS, ROUTINE W REFLEX MICROSCOPIC - Abnormal; Notable for the following components:   Specific Gravity, Urine >1.046 (*)    Hgb urine dipstick SMALL (*)    Ketones, ur 20 (*)      Protein, ur 100 (*)    All other components within normal limits  LIPASE, BLOOD    EKG None  Radiology Ct Abdomen Pelvis W Contrast  Result Date: 01/17/2018 CLINICAL DATA:  Right upper quadrant abdominal pain since 4 p.m. yesterday. Ex-smoker. Alcohol usage. EXAM: CT ABDOMEN AND PELVIS WITH CONTRAST TECHNIQUE: Multidetector CT imaging of the abdomen and pelvis was performed using the standard protocol following bolus administration of intravenous contrast. CONTRAST:  ISOVUE-300 IOPAMIDOL (ISOVUE-300) INJECTION 61% COMPARISON:  Right upper quadrant ultrasound of earlier today. 10/16/2015 CT FINDINGS: Lower chest: Clear lung bases. Normal heart size without pericardial or pleural effusion. Suspect distal esophageal wall thickening, including on image 5/2. Hepatobiliary:  Normal liver. Normal gallbladder, without biliary ductal dilatation. Pancreas: Normal, without mass or ductal dilatation. Spleen: Normal in size, without focal abnormality. Adrenals/Urinary Tract: Normal adrenal glands. Lower pole left renal lesion is too small to characterize. Normal right kidney, without hydronephrosis or hydroureter. Normal urinary bladder. Stomach/Bowel: Gastric antral underdistention. Normal terminal ileum. Appendix not visualized. Normal small bowel. Vascular/Lymphatic: Advanced aortic and branch vessel atherosclerosis. No abdominopelvic adenopathy. Reproductive: Normal prostate. Other: No significant free fluid. Musculoskeletal: Bilateral L5 pars defects. Trace L5-S1 anterolisthesis with degenerative disc disease at this level. IMPRESSION: 1. No acute process or explanation for right upper quadrant abdominal pain. 2.  Aortic and branch vessel atherosclerosis. 3. Suspect mild distal esophageal wall thickening. Correlate with symptoms of esophagitis. Electronically Signed   By: Jeronimo GreavesKyle  Talbot M.D.   On: 01/17/2018 11:52   [place] Abdomen Limited Ruq  Result Date: 01/17/2018 CLINICAL DATA:  Right upper quadrant  pain. EXAM: ULTRASOUND ABDOMEN LIMITED RIGHT UPPER QUADRANT COMPARISON:  CT abdomen pelvis dated October 16, 2015. FINDINGS: Gallbladder: No gallstones or wall thickening visualized. No sonographic Murphy sign noted by sonographer. Common bile duct: Diameter: 2 mm, normal. Liver: No focal lesion identified. Within normal limits in parenchymal echogenicity. Portal vein is patent on color Doppler imaging with normal direction of blood flow towards the liver. IMPRESSION: Normal right upper quadrant ultrasound. Electronically Signed   By: Obie DredgeWilliam T Derry M.D.   On: 01/17/2018 10:06    Procedures Procedures (including critical care time)  Medications Ordered in ED Medications  iopamidol (ISOVUE-300) 61 % injection (has no administration in time range)  sodium chloride (PF) 0.9 % injection (has no administration in time range)  sodium chloride 0.9 % bolus 1,000 mL (0 mLs Intravenous Stopped 01/17/18 1019)  ondansetron (ZOFRAN) injection 4 mg (4 mg Intravenous Given 01/17/18 0904)  morphine 4 MG/ML injection 4 mg (4 mg Intravenous Given 01/17/18 0905)  HYDROmorphone (DILAUDID) injection 0.5 mg (0.5 mg Intravenous Given 01/17/18 1102)  alum & mag hydroxide-simeth (MAALOX/MYLANTA) 200-200-20 MG/5ML suspension 30 mL (30 mLs Oral Given 01/17/18 1102)    And  lidocaine (XYLOCAINE) 2 % viscous mouth solution 15 mL (15 mLs Oral Given 01/17/18 1102)  iopamidol (ISOVUE-300) 61 % injection 100 mL (100 mLs Intravenous Contrast Given 01/17/18 1118)     Initial Impression / Assessment and Plan / ED Course  I have reviewed the triage vital signs and the nursing notes.  Pertinent labs & imaging results that were available during my care of the patient were reviewed by me and considered in my medical decision making (see chart for details).  52 year old male who appears otherwise well presents for evaluation of right upper quadrant epigastric pain.  Symptom onset 2 days ago.  Has had multiple episodes of nonbloody,  nonbilious emesis.  States he has felt warm, however states he felt this way when he was taking a "really hot bath."  Abdomen tender to right upper quadrant epigastric.  There is mild guarding however this is distractible in nature.  No rebound or rigidity.  Similar symptoms in past seen by GI.  Patient was supposed to follow-up with HIDA scan colonoscopy, however did not follow up.  Denies NSAID use or history H. Pylori.  Denies melena or bright red blood per rectum.  Takes Zantac daily for reflux.  Afebrile, nonseptic, non-ill-appearing.  He does have mild elevation in his blood pressure as well as mildly tachycardic.  A history of hypertension, however does not follow-up with PCP and does not take medications for this.  Will obtain labs, urine and ultrasound right upper quadrant and reevaluate.  1000: Ultrasound right upper quadrant negative.  CBC without leukocytosis at 17.2.  Hemoglobin 17.4.,  Metabolic panel with mild elevation in glucose at 134, normal renal and liver function.  Lipase 36.  Urinalysis pending.  Patient with decrease in pain with pain medicine, however endorsing 8/10 pain.  Will give GI cocktail and pain medicine reevaluate. Given elevated white count and continued pain will obtain CT scan.  1200: CT scan with possible esophagitis.  Patient states he "feels better."  Rates his pain a 3/10 currently.  Will trial  p.o. intake and reevaluate.  1230: Patient able to tolerate #2- ginger ale without difficulty.  Able to tolerate crackers without difficulty.  Patient has had no episodes of emesis in department.  Abdomen nontender on reevaluation, without rebound, rigidity or guarding.  Patient is sitting up in bed laughing and talking with family members.  Appears in no acute distress.  1310: Patient without emesis on reevaluation.  Patient has not had any emesis while in department.  Denies abdominal pain, Continued benign abdominal exam. Patient is nontoxic, nonseptic appearing, in no  apparent distress.  Patient's pain and other symptoms adequately managed in emergency department.  Fluid bolus given.  Heart rate and blood pressure improved with fluid and pain control.  Will have patient follow-up with gastroenterology for abdominal pain as well as referred to PCP for reevaluation of his blood pressure.  He has no headache, vision changes, chest pain or shortness of breath.  Labs, imaging and vitals reviewed.  Patient does not meet the SIRS or Sepsis criteria.  On repeat exam patient does not have a surgical abdomin and there are no peritoneal signs.  No indication of appendicitis, bowel obstruction, bowel perforation, cholecystitis, diverticulitis. Patient discharged home with symptomatic treatment and given strict instructions for follow-up with their primary care physician.  Patient is hemodynamically stable and appropriate for DC home at this time.  I have also discussed reasons to return immediately to the ER.  Patient expresses understanding and agrees with plan.   Clinical Course as of Jan 18 1628  Sat Jan 17, 2018  1242 Possible esophagitis.  CT ABDOMEN PELVIS W CONTRAST [BH]  1245 Negative  US Abdomen Limited RUQ [BH]  1245 Negative for infection  Urinalysis, Routine w reflex microscopic(!) [BH]  1245 No evidence of pancreatitis  Lipase, blood [BH]  1245 Leukocytosis at 17.2, hemoglobin 17.4  CBC with Differential(!) [BH]    Clinical Course User Index [BH] Alba Kriesel A, PA-C      Final Clinical Impressions(s) / ED Diagnoses   Final diagnoses:  RUQ abdominal pain  Esophagitis    ED Discharge Orders         Ordered    omeprazole (PRILOSEC) 20 MG capsule  Daily     01/17/18 1310    ondansetron (ZOFRAN) 4 MG tablet  Every 6 hours     01/17/18 1310           Karsen Fellows A, PA-C 01/17/18 1629    [name], MD 01/17/18 1720

## 2018-01-17 NOTE — ED Provider Notes (Signed)
Complains of right upper quadrant pain, burning in quality onset 2 nights ago.  He states he is vomited approximately 40 times since onset of pain.  Last bowel movement yesterday, no blood per rectum.  No diarrhea.  He feels much improved since treatment here.  He has had similar pain in the past.  Etiology unclear.  Denies hematemesis denies blood per rectum.   Doug SouJacubowitz, [name], MD 01/17/18 [phone]

## 2018-01-17 NOTE — ED Triage Notes (Signed)
Per patient he has been vomiting since yesterday and the pain in right upper quadrant is 10/10

## 2018-01-17 NOTE — ED Triage Notes (Addendum)
Brought in by EMS with right upper quadrant abdominal pain since 4 pm yesterday. Pain 8/10 .  Pain has gotten progressively worse.

## 2018-03-23 ENCOUNTER — Emergency Department (HOSPITAL_COMMUNITY)
Admission: EM | Admit: 2018-03-23 | Discharge: 2018-03-23 | Disposition: A | Discharge status: Home / Self Care | Payer: Self-pay | Attending: Emergency Medicine | Admitting: Emergency Medicine

## 2018-03-23 ENCOUNTER — Encounter (HOSPITAL_COMMUNITY): Payer: Self-pay | Admitting: *Deleted

## 2018-03-23 ENCOUNTER — Emergency Department (HOSPITAL_COMMUNITY): Payer: Self-pay

## 2018-03-23 ENCOUNTER — Other Ambulatory Visit: Payer: Self-pay

## 2018-03-23 DIAGNOSIS — R112 Nausea with vomiting, unspecified: Secondary | ICD-10-CM | POA: Insufficient documentation

## 2018-03-23 DIAGNOSIS — R7989 Other specified abnormal findings of blood chemistry: Secondary | ICD-10-CM

## 2018-03-23 DIAGNOSIS — R3129 Other microscopic hematuria: Secondary | ICD-10-CM | POA: Insufficient documentation

## 2018-03-23 DIAGNOSIS — Z87891 Personal history of nicotine dependence: Secondary | ICD-10-CM | POA: Insufficient documentation

## 2018-03-23 DIAGNOSIS — J45909 Unspecified asthma, uncomplicated: Secondary | ICD-10-CM | POA: Insufficient documentation

## 2018-03-23 DIAGNOSIS — D72829 Elevated white blood cell count, unspecified: Secondary | ICD-10-CM | POA: Insufficient documentation

## 2018-03-23 DIAGNOSIS — R790 Abnormal level of blood mineral: Secondary | ICD-10-CM | POA: Insufficient documentation

## 2018-03-23 DIAGNOSIS — R195 Other fecal abnormalities: Secondary | ICD-10-CM

## 2018-03-23 DIAGNOSIS — Z79899 Other long term (current) drug therapy: Secondary | ICD-10-CM | POA: Insufficient documentation

## 2018-03-23 LAB — POC OCCULT BLOOD, ED: Fecal Occult Bld: POSITIVE — AB

## 2018-03-23 LAB — URINALYSIS, ROUTINE W REFLEX MICROSCOPIC
BILIRUBIN URINE: NEGATIVE
GLUCOSE, UA: NEGATIVE mg/dL
Ketones, ur: 5 mg/dL — AB
LEUKOCYTES UA: NEGATIVE
NITRITE: NEGATIVE
PROTEIN: NEGATIVE mg/dL
Specific Gravity, Urine: 1.011 (ref 1.005–1.030)
pH: 5 (ref 5.0–8.0)

## 2018-03-23 LAB — COMPREHENSIVE METABOLIC PANEL
ALT: 23 U/L (ref 0–44)
ANION GAP: 13 (ref 5–15)
AST: 33 U/L (ref 15–41)
Albumin: 4.5 g/dL (ref 3.5–5.0)
Alkaline Phosphatase: 50 U/L (ref 38–126)
BUN: 27 mg/dL — AB (ref 6–20)
CHLORIDE: 103 mmol/L (ref 98–111)
CO2: 23 mmol/L (ref 22–32)
Calcium: 9.9 mg/dL (ref 8.9–10.3)
Creatinine, Ser: 1.45 mg/dL — ABNORMAL HIGH (ref 0.61–1.24)
GFR calc Af Amer: >60 mL/min (ref >60)
GFR, EST NON AFRICAN AMERICAN: 55 mL/min — AB (ref >60)
Glucose, Bld: 123 mg/dL — ABNORMAL HIGH (ref 70–99)
POTASSIUM: 4.1 mmol/L (ref 3.5–5.1)
Sodium: 139 mmol/L (ref 135–145)
Total Bilirubin: 1.7 mg/dL — ABNORMAL HIGH (ref 0.3–1.2)
Total Protein: 7.9 g/dL (ref 6.5–8.1)

## 2018-03-23 LAB — RAPID URINE DRUG SCREEN, HOSP PERFORMED
Amphetamines: NOT DETECTED
Barbiturates: NOT DETECTED
Benzodiazepines: NOT DETECTED
Cocaine: NOT DETECTED
OPIATES: NOT DETECTED
TETRAHYDROCANNABINOL: POSITIVE — AB

## 2018-03-23 LAB — CBC
HEMATOCRIT: 55.3 % — AB (ref 39.0–52.0)
HEMOGLOBIN: 17.9 g/dL — AB (ref 13.0–17.0)
MCH: 30.2 pg (ref 26.0–34.0)
MCHC: 32.4 g/dL (ref 30.0–36.0)
MCV: 93.4 fL (ref 80.0–100.0)
Platelets: 365 10*3/uL (ref 150–400)
RBC: 5.92 MIL/uL — AB (ref 4.22–5.81)
RDW: 13.9 % (ref 11.5–15.5)
WBC: 19.4 10*3/uL — AB (ref 4.0–10.5)
nRBC: 0 % (ref 0.0–0.2)

## 2018-03-23 LAB — LIPASE, BLOOD: LIPASE: 31 U/L (ref 11–51)

## 2018-03-23 LAB — I-STAT TROPONIN, ED: Troponin i, poc: 0.02 ng/mL (ref 0.00–0.08)

## 2018-03-23 MED ORDER — IOHEXOL 300 MG/ML  SOLN
100.0000 mL | Freq: Once | INTRAMUSCULAR | Status: AC | PRN
  Administered 2018-03-23: 75 mL via INTRAVENOUS

## 2018-03-23 MED ORDER — SODIUM CHLORIDE 0.9% FLUSH
3.0000 mL | Freq: Once | INTRAVENOUS | Status: AC
  Administered 2018-03-23: 3 mL via INTRAVENOUS

## 2018-03-23 MED ORDER — PROMETHAZINE HCL 25 MG PO TABS: 25.0000 mg | ORAL_TABLET | Freq: Four times a day (QID) | ORAL | 0 refills | Status: DC | PRN

## 2018-03-23 MED ORDER — LORAZEPAM 2 MG/ML IJ SOLN
1.0000 mg | Freq: Once | INTRAMUSCULAR | Status: AC
  Administered 2018-03-23: 1 mg via INTRAVENOUS
  Filled 2018-03-23: qty 1

## 2018-03-23 MED ORDER — CAPSAICIN 0.025 % EX CREA
TOPICAL_CREAM | Freq: Two times a day (BID) | CUTANEOUS | Status: DC
  Administered 2018-03-23: 18:00:00 via TOPICAL
  Filled 2018-03-23: qty 60

## 2018-03-23 MED ORDER — SODIUM CHLORIDE 0.9 % IV BOLUS
1000.0000 mL | Freq: Once | INTRAVENOUS | Status: AC
  Administered 2018-03-23: 1000 mL via INTRAVENOUS

## 2018-03-23 MED ORDER — PROMETHAZINE HCL 25 MG/ML IJ SOLN
25.0000 mg | Freq: Once | INTRAMUSCULAR | Status: AC
  Administered 2018-03-23: 25 mg via INTRAVENOUS
  Filled 2018-03-23: qty 1

## 2018-03-23 NOTE — ED Notes (Addendum)
Pt states he feels "dehydrated" and was unable to provide a urine sample. States the pain started after he urinated this morning. Pt ambulatory to bathroom with no reported issues.

## 2018-03-23 NOTE — ED Notes (Signed)
Patient transported to CT 

## 2018-03-23 NOTE — ED Provider Notes (Signed)
[facility] EMERGENCY DEPARTMENT Provider Note   CSN: [id number] Arrival date & time: 03/23/18  1155     History   Chief Complaint Chief Complaint  Patient presents with  . Abdominal Pain    HPI Alexx Mcburney is a 53 y.o. male.  HPI  Patient is a 53 year old male with a history of arthritis, asthma, depression, and upper GI bleed presenting for epigastric pain, nausea, vomiting.  Patient reports that this will happen periodically every couple months.  He reports that he has been unable to afford to go see a gastroenterologist.  Patient reports that this current episode began 2 days ago.  He reports that anytime he attempts to take anything p.o. he will vomit.  Denies hematemesis.  Patient reports last bowel movement today and normal for him.  No melena or hematochezia, but he does report that he has had "dark stools".  Patient ports that pain is epigastric.  No right upper quadrant pain.  No diarrhea.  Patient reports maximum daily use of 2 glasses of wine.  He reports nearly daily use of cannabis. Denies other illicit drug use. Denies NSAID use.  Denies known history of stomach ulcer.  Patient ports he was supposed to have a EGD couple years ago, and was then also unable to follow-up recently due to loss of insurance.  Past Medical History:  Diagnosis Date  . Arthritis   . Asthma   . Depression     Patient Active Problem List   Diagnosis Date Noted  . Upper GI bleed 01/02/2014  . Hematemesis 01/02/2014  . Epigastric pain     Past Surgical History:  Procedure Laterality Date  . CLEFT PALATE REPAIR  age 25   [facility]  . NO PAST SURGERIES          Home Medications    Prior to Admission medications   Medication Sig Start Date End Date Taking? Authorizing Provider  Misc Natural Products (NF FORMULAS TESTOSTERONE PO) Take 1 capsule by mouth daily.    [provider]  omeprazole (PRILOSEC) 20 MG capsule Take 1 capsule (20 mg total) by mouth  daily. 01/17/18   Henderly, Britni A, PA-C  ondansetron (ZOFRAN) 4 MG tablet Take 1 tablet (4 mg total) by mouth every 6 (six) hours. 01/17/18   Henderly, Britni A, PA-C  ranitidine (ZANTAC) 150 MG tablet Take 150 mg by mouth 2 (two) times daily.    [provider]    Family History Family History  Problem Relation Age of Onset  . Drug abuse Father   . Stroke Father   . Hypertension Father     Social History Social History   Tobacco Use  . Smoking status: Former Smoker    Packs/day: 1.00    Years: 30.00    Pack years: 30.00    Types: Cigarettes    Last attempt to quit: 05/01/2011    Years since quitting: 6.8  . Smokeless tobacco: Never Used  . Tobacco comment: quit x 6 months at age 66  Substance Use Topics  . Alcohol use: Yes    Alcohol/week: 27.0 standard drinks    Types: 15 Cans of beer, 12 Standard drinks or equivalent per week  . Drug use: Yes    Types: Marijuana     Allergies   Patient has no known allergies.   Review of Systems Review of Systems  Constitutional: Negative for chills and fever.  HENT: Negative for congestion, rhinorrhea, sinus pain and sore throat.  Eyes: Negative for visual disturbance.  Respiratory: Negative for cough, chest tightness and shortness of breath.   Cardiovascular: Negative for chest pain, palpitations and leg swelling.  Gastrointestinal: Positive for abdominal pain, nausea and vomiting. Negative for blood in stool, constipation and diarrhea.  Genitourinary: Negative for dysuria and flank pain.  Musculoskeletal: Negative for back pain and myalgias.  Skin: Negative for rash.  Neurological: Negative for dizziness, syncope, light-headedness and headaches.     Physical Exam Updated Vital Signs BP (!) 190/128   Pulse 95   Temp 97.7 F (36.5 C) (Oral)   Resp (!) 25   SpO2 99%   Physical Exam Vitals signs and nursing note reviewed.  Constitutional:      General: He is not in acute distress.    Appearance: He is  well-developed.     Comments: Appears uncomfortable on initial exam.   HENT:     Head: Normocephalic and atraumatic.  Eyes:     Conjunctiva/sclera: Conjunctivae normal.     Pupils: Pupils are equal, round, and reactive to light.  Neck:     Musculoskeletal: Normal range of motion and neck supple.  Cardiovascular:     Rate and Rhythm: Normal rate and regular rhythm.     Heart sounds: S1 normal and S2 normal. No murmur.  Pulmonary:     Effort: Pulmonary effort is normal.     Breath sounds: Normal breath sounds. No wheezing or rales.  Abdominal:     General: There is no distension.     Palpations: Abdomen is soft.     Tenderness: There is abdominal tenderness in the epigastric area and periumbilical area. There is no guarding or rebound.  Genitourinary:    Comments: Rectal examination performed with RN chaperone present.  Normal rectal tone.  Soft brown stool present in rectal vault.  No melena, hematochezia, or maroon-colored stool. Musculoskeletal: Normal range of motion.        General: No deformity.  Lymphadenopathy:     Cervical: No cervical adenopathy.  Skin:    General: Skin is warm and dry.     Findings: No erythema or rash.  Neurological:     Mental Status: He is alert.     Comments: Cranial nerves grossly intact. Patient moves extremities symmetrically and with good coordination.  Psychiatric:        Behavior: Behavior normal.        Thought Content: Thought content normal.        Judgment: Judgment normal.      ED Treatments / Results  Labs (all labs ordered are listed, but only abnormal results are displayed) Labs Reviewed  COMPREHENSIVE METABOLIC PANEL - Abnormal; Notable for the following components:      Result Value   Glucose, Bld 123 (*)    BUN 27 (*)    Creatinine, Ser 1.45 (*)    Total Bilirubin 1.7 (*)    GFR calc non Af Amer 55 (*)    All other components within normal limits  CBC - Abnormal; Notable for the following components:   WBC 19.4 (*)      RBC 5.92 (*)    Hemoglobin 17.9 (*)    HCT 55.3 (*)    All other components within normal limits  URINALYSIS, ROUTINE W REFLEX MICROSCOPIC - Abnormal; Notable for the following components:   Hgb urine dipstick MODERATE (*)    Ketones, ur 5 (*)    Bacteria, UA RARE (*)    All other components within normal limits  LIPASE, BLOOD  I-STAT TROPONIN, ED    EKG EKG Interpretation  Date/Time:  Monday March 23 2018 14:24:03 EST Ventricular Rate:  84 PR Interval:    QRS Duration: 81 QT Interval:  380 QTC Calculation: 450 R Axis:   83 Text Interpretation:  Sinus rhythm increased t waves compared with prior beut seen before c/w 9/17 Confirmed by Meridee ScoreButler, Michael [phone]) on 03/23/2018 2:27:58 PM   Radiology No results found.  Procedures Procedures (including critical care time)  Medications Ordered in ED Medications  sodium chloride 0.9 % bolus 1,000 mL (has no administration in time range)  sodium chloride flush (NS) 0.9 % injection 3 mL (3 mLs Intravenous Given 03/23/18 1309)  promethazine (PHENERGAN) injection 25 mg (25 mg Intravenous Given 03/23/18 1330)  LORazepam (ATIVAN) injection 1 mg (1 mg Intravenous Given 03/23/18 1409)  sodium chloride 0.9 % bolus 1,000 mL (0 mLs Intravenous Stopped 03/23/18 1535)  iohexol (OMNIPAQUE) 300 MG/ML solution 100 mL (75 mLs Intravenous Contrast Given 03/23/18 1516)     Initial Impression / Assessment and Plan / ED Course  I have reviewed the triage vital signs and the nursing notes.  Pertinent labs & imaging results that were available during my care of the patient were reviewed by me and considered in my medical decision making (see chart for details).  Clinical Course as of Mar 23 2000  Mon Mar 23, 2018  1521 Ts appear peaked. Normal K.   EKG 12-Lead [AM]  1521 Possibly indicative of dehydration.   BUN(!): 27 [AM]  1559 Patient chronically has leukocytosis.   WBC(!): 19.4 [AM]  1605 Abdominal pain improved. No further vomiting.     [AM]    Clinical Course User Index [AM] Elisha PonderMurray, Tareva Leske B, PA-C    Patient nontoxic-appearing, hemodynamically stable, with nonsurgical abdomen.  Differential diagnosis includes peptic ulcer disease, cholecystitis, small bowel obstruction, pancreatitis, cannabis hyperemesis, ACS, cyclical vomiting syndrome, withdrawal syndrome.  Work-up demonstrating leukocytosis to 19.4.  This appears to be chronic.  Lipase normal. He has had leukocytosis going back for a couple years.  He may also be hemoconcentrated given the hemoglobin of 17.9.  Additionally, patient has slightly increased renal function with a BUN of 27 and creatinine of 1.45.  Suspect this is secondary to volume depletion from vomiting.  Troponins negative.  EKG with slight increased T waves. Nonspecific, and may be repolarization abnormality.  Patient has microscopic hemoglobinuria.  His UDS is positive for cannabis.  Fecal occult blood is positive, however rectal exam without melena, hematochezia or maroon-colored stool.  Patient had significant improvement during emergency department course.  He he received 2 L of normal saline.  He is tolerating p.o. both solids and liquids.  Patient had a response to antiemetics and capsaicin cream.  I do suspect that there is an element of cannabis hyperemesis.  Patient also has significantly elevated blood pressures in emergency department.  I suspect this is due to pain and discomfort.  Do not suspect withdrawal.  Patient has had improvement to her emergency department course, is comfortable, and CIWA score was 6.  Last alcohol use 48 hours ago per patient.  He is clinically sober.  Hypertension improved after symptoms were treated and patient was symptomatically feeling much better.  Case management consulted for assistance in patient's multiple chronic conditions and occult positive stool in the setting of requiring GI follow-up.  Patient reports he is attempted to attain GI follow-up was unable to pay  the co-pays.  Patient to be followed by  [facility].  Appreciate the involvement of case management.  Patient prescribed Phenergan for breakthrough nausea and vomiting.  Patient was given return precautions for any intractable nausea or vomiting, worsening pain, or any new or worsening symptoms.  Patient is in understanding and agrees with the plan of care.    Final Clinical Impressions(s) / ED Diagnoses   Final diagnoses:  Non-intractable vomiting with nausea, unspecified vomiting type  Leukocytosis, unspecified type  Microscopic hematuria  Fecal occult blood test positive  Elevated serum creatinine    ED Discharge Orders         Ordered    promethazine (PHENERGAN) 25 MG tablet  Every 6 hours PRN     03/23/18 1956           Delia Chimes 03/23/18 2108    Terrilee Files, MD 03/24/18 1746

## 2018-03-23 NOTE — ED Notes (Signed)
Discharge instructions and prescription discussed with Pt. Pt verbalized understanding. Pt stable and ambulatory.    

## 2018-03-23 NOTE — ED Notes (Signed)
Pt states  A toke every night of pot

## 2018-03-23 NOTE — Discharge Instructions (Addendum)
Please read and follow all provided instructions.  Your diagnoses today include:  1. Non-intractable vomiting with nausea, unspecified vomiting type    There may be an element of cannabis use affecting your nausea vomiting.  I recommend stopping cannabis.  Tests performed today include: Blood counts and electrolytes Blood tests to check liver and kidney function Blood tests to check pancreas function Urine test to look for infection and pregnancy (in women) Vital signs. See below for your results today.   Medications prescribed:   Take any prescribed medications only as directed.  You are prescribed Phenergan in addition to the Zofran.  You may take each 1 every 8 hours through getting something for nausea every 4 hours.  Please apply the capsaicin cream in a thin layer over abdomen twice a day when you have these cyclical vomiting spells.  Home care instructions:  Follow any educational materials contained in this packet.  Your abdominal pain, nausea, vomiting, and diarrhea may be caused by a viral gastroenteritis also called 'stomach flu'. You should rest for the next several days. Keep drinking plenty of fluids and use the medicine for nausea as directed.   Drink clear liquids for the next 24 hours and introduce solid foods slowly after 24 hours using the b.r.a.t. diet (Bananas, Rice, Applesauce, Toast, Yogurt).    Follow-up instructions: Please follow-up with your primary care provider in the next 2 days for further evaluation of your symptoms. If you are not feeling better in 48 hours you may have a condition that is more serious and you need re-evaluation.   Return instructions:  SEEK IMMEDIATE MEDICAL ATTENTION IF: If you have pain that does not go away or becomes severe  A temperature above 101F develops  Repeated vomiting occurs (multiple episodes)  If you have pain that becomes localized to portions of the abdomen. The right side could possibly be appendicitis. In an  adult, the left lower portion of the abdomen could be colitis or diverticulitis.  Blood is being passed in stools or vomit (bright red or black tarry stools)  You develop chest pain, difficulty breathing, dizziness or fainting, or become confused, poorly responsive, or inconsolable (young children) If you have any other emergent concerns regarding your health  Additional Information: Abdominal (belly) pain can be caused by many things. Your caregiver performed an examination and possibly ordered blood/urine tests and imaging (CT scan, x-rays, ultrasound). Many cases can be observed and treated at home after initial evaluation in the emergency department. Even though you are being discharged home, abdominal pain can be unpredictable. Therefore, you need a repeated exam if your pain does not resolve, returns, or worsens. Most patients with abdominal pain don't have to be admitted to the hospital or have surgery, but serious problems like appendicitis and gallbladder attacks can start out as nonspecific pain. Many abdominal conditions cannot be diagnosed in one visit, so follow-up evaluations are very important.  Your vital signs today were: BP (!) 198/120    Pulse 89    Temp 97.7 F (36.5 C) (Oral)    Resp (!) 25    SpO2 100%  If your blood pressure (bp) was elevated above 135/85 this visit, please have this repeated by your doctor within one month. --------------

## 2018-03-23 NOTE — ED Triage Notes (Signed)
Pt in c/o abdominal pain and nausea, no vomiting yet today, recent history of same with intermittent episodes for the last two months, went to another ED yesterday for similar symptoms, improved with medication but today pain returned

## 2018-03-23 NOTE — ED Notes (Signed)
Pt given ginger ale.

## 2018-03-23 NOTE — ED Notes (Signed)
Pt eating saltines and drinking sprite. 

## 2018-03-25 ENCOUNTER — Emergency Department (HOSPITAL_COMMUNITY)
Admission: EM | Admit: 2018-03-25 | Discharge: 2018-03-25 | Disposition: A | Discharge status: Home / Self Care | Payer: Self-pay | Attending: Emergency Medicine | Admitting: Emergency Medicine

## 2018-03-25 ENCOUNTER — Encounter (HOSPITAL_COMMUNITY): Payer: Self-pay | Admitting: *Deleted

## 2018-03-25 ENCOUNTER — Other Ambulatory Visit: Payer: Self-pay

## 2018-03-25 DIAGNOSIS — Z87891 Personal history of nicotine dependence: Secondary | ICD-10-CM | POA: Insufficient documentation

## 2018-03-25 DIAGNOSIS — K2900 Acute gastritis without bleeding: Secondary | ICD-10-CM

## 2018-03-25 DIAGNOSIS — I10 Essential (primary) hypertension: Secondary | ICD-10-CM

## 2018-03-25 DIAGNOSIS — F1290 Cannabis use, unspecified, uncomplicated: Secondary | ICD-10-CM | POA: Insufficient documentation

## 2018-03-25 DIAGNOSIS — Z79899 Other long term (current) drug therapy: Secondary | ICD-10-CM | POA: Insufficient documentation

## 2018-03-25 DIAGNOSIS — R112 Nausea with vomiting, unspecified: Secondary | ICD-10-CM

## 2018-03-25 LAB — RAPID URINE DRUG SCREEN, HOSP PERFORMED
AMPHETAMINES: NOT DETECTED
Barbiturates: NOT DETECTED
Benzodiazepines: NOT DETECTED
Cocaine: NOT DETECTED
OPIATES: NOT DETECTED
TETRAHYDROCANNABINOL: POSITIVE — AB

## 2018-03-25 LAB — COMPREHENSIVE METABOLIC PANEL
ALK PHOS: 44 U/L (ref 38–126)
ALT: 26 U/L (ref 0–44)
AST: 50 U/L — ABNORMAL HIGH (ref 15–41)
Albumin: 4.3 g/dL (ref 3.5–5.0)
Anion gap: 12 (ref 5–15)
BUN: 13 mg/dL (ref 6–20)
CO2: 24 mmol/L (ref 22–32)
Calcium: 9.5 mg/dL (ref 8.9–10.3)
Chloride: 105 mmol/L (ref 98–111)
Creatinine, Ser: 1.18 mg/dL (ref 0.61–1.24)
GFR calc Af Amer: >60 mL/min (ref >60)
GFR calc non Af Amer: >60 mL/min (ref >60)
GLUCOSE: 119 mg/dL — AB (ref 70–99)
Potassium: 3.5 mmol/L (ref 3.5–5.1)
Sodium: 141 mmol/L (ref 135–145)
Total Bilirubin: 1.3 mg/dL — ABNORMAL HIGH (ref 0.3–1.2)
Total Protein: 7.3 g/dL (ref 6.5–8.1)

## 2018-03-25 LAB — URINALYSIS, ROUTINE W REFLEX MICROSCOPIC
BILIRUBIN URINE: NEGATIVE
Bacteria, UA: NONE SEEN
Glucose, UA: NEGATIVE mg/dL
Ketones, ur: 5 mg/dL — AB
Leukocytes,Ua: NEGATIVE
Nitrite: NEGATIVE
Protein, ur: NEGATIVE mg/dL
Specific Gravity, Urine: 1.012 (ref 1.005–1.030)
pH: 6 (ref 5.0–8.0)

## 2018-03-25 LAB — CBC
HCT: 52 % (ref 39.0–52.0)
Hemoglobin: 17.1 g/dL — ABNORMAL HIGH (ref 13.0–17.0)
MCH: 30.6 pg (ref 26.0–34.0)
MCHC: 32.9 g/dL (ref 30.0–36.0)
MCV: 93 fL (ref 80.0–100.0)
Platelets: 320 10*3/uL (ref 150–400)
RBC: 5.59 MIL/uL (ref 4.22–5.81)
RDW: 13.2 % (ref 11.5–15.5)
WBC: 16.9 10*3/uL — ABNORMAL HIGH (ref 4.0–10.5)
nRBC: 0 % (ref 0.0–0.2)

## 2018-03-25 LAB — ETHANOL: Alcohol, Ethyl (B): <10 mg/dL (ref <10)

## 2018-03-25 LAB — LIPASE, BLOOD: Lipase: 29 U/L (ref 11–51)

## 2018-03-25 MED ORDER — PROMETHAZINE HCL 25 MG/ML IJ SOLN
25.0000 mg | Freq: Once | INTRAMUSCULAR | Status: AC
  Administered 2018-03-25: 25 mg via INTRAVENOUS
  Filled 2018-03-25: qty 1

## 2018-03-25 MED ORDER — AMLODIPINE BESYLATE 5 MG PO TABS
10.0000 mg | ORAL_TABLET | Freq: Once | ORAL | Status: AC
  Administered 2018-03-25: 10 mg via ORAL
  Filled 2018-03-25: qty 2

## 2018-03-25 MED ORDER — SODIUM CHLORIDE 0.9 % IV BOLUS
1000.0000 mL | Freq: Once | INTRAVENOUS | Status: AC
  Administered 2018-03-25: 1000 mL via INTRAVENOUS

## 2018-03-25 MED ORDER — SODIUM CHLORIDE 0.9% FLUSH
3.0000 mL | Freq: Once | INTRAVENOUS | Status: AC
  Administered 2018-03-25: 3 mL via INTRAVENOUS

## 2018-03-25 MED ORDER — FAMOTIDINE IN NACL 20-0.9 MG/50ML-% IV SOLN
20.0000 mg | Freq: Once | INTRAVENOUS | Status: AC
  Administered 2018-03-25: 20 mg via INTRAVENOUS
  Filled 2018-03-25: qty 50

## 2018-03-25 MED ORDER — ONDANSETRON HCL 4 MG/2ML IJ SOLN
4.0000 mg | Freq: Once | INTRAMUSCULAR | Status: AC
  Administered 2018-03-25: 4 mg via INTRAVENOUS
  Filled 2018-03-25: qty 2

## 2018-03-25 MED ORDER — HALOPERIDOL LACTATE 5 MG/ML IJ SOLN
2.0000 mg | Freq: Once | INTRAMUSCULAR | Status: AC
  Administered 2018-03-25: 2 mg via INTRAVENOUS
  Filled 2018-03-25: qty 1

## 2018-03-25 MED ORDER — DICYCLOMINE HCL 20 MG PO TABS: 20.0000 mg | ORAL_TABLET | Freq: Two times a day (BID) | ORAL | 0 refills | Status: DC

## 2018-03-25 MED ORDER — AMLODIPINE BESYLATE 10 MG PO TABS: 10.0000 mg | ORAL_TABLET | Freq: Every day | ORAL | 0 refills | Status: DC

## 2018-03-25 NOTE — Discharge Instructions (Addendum)
Take the medications as prescribed.  Follow-up with the primary care doctor and the GI doctor that you were referred to.  Make sure to have your blood pressure checked in the next week or so.  Return as needed for worsening symptoms

## 2018-03-25 NOTE — ED Triage Notes (Addendum)
Pt endorses vomiting with dull abd pain.  He was seen in the ED for same x 2 days ago.  He was also seen in the past for same.  Has hx of intractable vomiting for the past 6 years.  Has had endoscopy and had his gallbladder checked which are unremarkable.  He has been making himself vomit today because he states he felt like it will relieve his abd pain.    Pt reports he has rxs for zofran,prilosec, and phenergan from Monday but he has not had them filled yet.  He used a phenergan suppository today which he has had.

## 2018-03-25 NOTE — ED Provider Notes (Signed)
[facility] Provider [facility]   CSN: [id number] Arrival date & time: 03/25/18  1232     History   Chief Complaint Chief Complaint  Patient presents with  . Emesis    HPI Christy GentlesJames Brant Donica is a 53 y.o. male.  HPI Patient presents emergency room for evaluation of persistent burning in his abdomen.  Patient was actually seen in the emergency room just 2 days ago.  At that time the patient was seen for intermittent episodes of upper abdominal discomfort.  Patient had been having symptoms for the last couple of days.  Anytime he would try to eat or drink he would vomit.  Patient would get diaphoretic and lightheaded before he would vomit.  Patient states he had an evaluation that included laboratory tests and a CT scan.  CT scan did not show any acute abnormalities.  Patient states he had been doing better and was able to eat yesterday but this morning he started vomiting again.  He continues to feel nauseated.  Patient previously had been referred to gastroenterology for an endoscopy but he did not have the money to proceed with that evaluation. Past Medical History:  Diagnosis Date  . Arthritis   . Asthma   . Depression     Patient Active Problem List   Diagnosis Date Noted  . Upper GI bleed 01/02/2014  . Hematemesis 01/02/2014  . Epigastric pain     Past Surgical History:  Procedure Laterality Date  . CLEFT PALATE REPAIR  age 89   [facility]  . NO PAST SURGERIES          Home Medications    Prior to Admission medications   Medication Sig Start Date End Date Taking? Authorizing Provider  Misc Natural Products (NF FORMULAS TESTOSTERONE PO) Take 1 capsule by mouth daily.   Yes [provider]  omeprazole (PRILOSEC) 20 MG capsule Take 1 capsule (20 mg total) by mouth daily. 01/17/18  Yes Henderly, Britni A, PA-C  ondansetron (ZOFRAN) 4 MG tablet Take 1 tablet (4 mg total) by mouth every 6 (six) hours. 01/17/18  Yes Henderly, Britni A,  PA-C  promethazine (PHENERGAN) 25 MG tablet Take 1 tablet (25 mg total) by mouth every 6 (six) hours as needed for nausea or vomiting. 03/23/18  Yes Dayton ScrapeMurray, Alyssa B, PA-C  amLODipine (NORVASC) 10 MG tablet Take 1 tablet (10 mg total) by mouth daily. 03/25/18   Linwood DibblesKnapp, Mikaylee Arseneau, MD  dicyclomine (BENTYL) 20 MG tablet Take 1 tablet (20 mg total) by mouth 2 (two) times daily. 03/25/18   Linwood DibblesKnapp, Nile Prisk, MD    Family History Family History  Problem Relation Age of Onset  . Drug abuse Father   . Stroke Father   . Hypertension Father     Social History Social History   Tobacco Use  . Smoking status: Former Smoker    Packs/day: 1.00    Years: 30.00    Pack years: 30.00    Types: Cigarettes    Last attempt to quit: 05/01/2011    Years since quitting: 6.9  . Smokeless tobacco: Never Used  . Tobacco comment: quit x 6 months at age 53  Substance Use Topics  . Alcohol use: Yes    Alcohol/week: 27.0 standard drinks    Types: 15 Cans of beer, 12 Standard drinks or equivalent per week  . Drug use: Yes    Types: Marijuana     Allergies   Patient has no known allergies.   Review of  Systems Review of Systems  All other systems reviewed and are negative.    Physical Exam Updated Vital Signs BP (!) 207/93 (BP Location: Right Arm)   Pulse 76   Temp 98.1 F (36.7 C) (Oral)   Resp 16   SpO2 98%   Physical Exam Vitals signs and nursing note reviewed.  Constitutional:      General: He is not in acute distress.    Appearance: He is well-developed.  HENT:     Head: Normocephalic and atraumatic.     Right Ear: External ear normal.     Left Ear: External ear normal.  Eyes:     General: No scleral icterus.       Right eye: No discharge.        Left eye: No discharge.     Conjunctiva/sclera: Conjunctivae normal.  Neck:     Musculoskeletal: Neck supple.     Trachea: No tracheal deviation.  Cardiovascular:     Rate and Rhythm: Normal rate and regular rhythm.  Pulmonary:     Effort:  Pulmonary effort is normal. No respiratory distress.     Breath sounds: Normal breath sounds. No stridor. No wheezing or rales.  Abdominal:     General: Bowel sounds are normal. There is no distension.     Palpations: Abdomen is soft.     Tenderness: There is no abdominal tenderness. There is no guarding or rebound.     Comments: Epigastric, mild ttp  Musculoskeletal:        General: No tenderness.  Skin:    General: Skin is warm and dry.     Findings: No rash.  Neurological:     Mental Status: He is alert.     Cranial Nerves: No cranial nerve deficit (no facial droop, extraocular movements intact, no slurred speech).     Sensory: No sensory deficit.     Motor: No abnormal muscle tone or seizure activity.     Coordination: Coordination normal.      ED Treatments / Results  Labs (all labs ordered are listed, but only abnormal results are displayed) Labs Reviewed  COMPREHENSIVE METABOLIC PANEL - Abnormal; Notable for the following components:      Result Value   Glucose, Bld 119 (*)    AST 50 (*)    Total Bilirubin 1.3 (*)    All other components within normal limits  CBC - Abnormal; Notable for the following components:   WBC 16.9 (*)    Hemoglobin 17.1 (*)    All other components within normal limits  URINALYSIS, ROUTINE W REFLEX MICROSCOPIC - Abnormal; Notable for the following components:   Hgb urine dipstick MODERATE (*)    Ketones, ur 5 (*)    All other components within normal limits  RAPID URINE DRUG SCREEN, HOSP PERFORMED - Abnormal; Notable for the following components:   Tetrahydrocannabinol POSITIVE (*)    All other components within normal limits  LIPASE, BLOOD  ETHANOL    EKG EKG Interpretation  Date/Time:  Wednesday March 25 2018 16:48:12 EST Ventricular Rate:  99 PR Interval:  126 QRS Duration: 82 QT Interval:  342 QTC Calculation: 438 R Axis:   86 Text Interpretation:  Normal sinus rhythm with sinus arrhythmia Normal ECG No significant change  since last tracing Confirmed by Linwood Dibbles [phone]) on 03/25/2018 5:13:38 PM   Radiology No results found.  Procedures Procedures (including critical care time)  Medications Ordered in ED Medications  sodium chloride flush (NS) 0.9 % injection 3  mL (3 mLs Intravenous Given 03/25/18 1425)  sodium chloride 0.9 % bolus 1,000 mL (0 mLs Intravenous Stopped 03/25/18 1523)  ondansetron (ZOFRAN) injection 4 mg (4 mg Intravenous Given 03/25/18 1423)  famotidine (PEPCID) IVPB 20 mg premix (0 mg Intravenous Stopped 03/25/18 1453)  promethazine (PHENERGAN) injection 25 mg (25 mg Intravenous Given 03/25/18 1632)  haloperidol lactate (HALDOL) injection 2 mg (2 mg Intravenous Given 03/25/18 1631)  amLODipine (NORVASC) tablet 10 mg (10 mg Oral Given 03/25/18 1702)     Initial Impression / Assessment and Plan / ED Course  I have reviewed the triage vital signs and the nursing notes.  Pertinent labs & imaging results that were available during my care of the patient were reviewed by me and considered in my medical decision making (see chart for details).  Clinical Course as of Mar 25 1748  Wed Mar 25, 2018  1627 Patient continues with burning discomfort in his abdomen.  No emesis noted in the ED although he states he has not been able to eat or drink anything properly.   [JK]  1635 BP is still elevated.  Plan on dose of Norvasc.  Additional medications for pain were ordered   [JK]    Clinical Course User Index [JK] Linwood DibblesKnapp, Bassheva Flury, MD    Patient presented to the emergency room for evaluation of recurrent abdominal pain.  Patient has been seen a few times for abdominal pain recently.  He does have a leukocytosis but this is trending down.  His electrolyte panel is unremarkable.  Drug screen is positive for marijuana.  he had a CT scan of his abdomen pelvis just 2 days ago.  No acute abnormality noted that than a small hiatal hernia.  Patient is hypertensive but is not having any trouble with chest pain.   Component of this may be related to his discomfort.  Patient was given a dose of Norvasc.  Patient will continue on his antacids.  I think he would benefit from outpatient gastroenterology evaluation.  Plan on discharge home with Bentyl and amlodipine.  Discussed the importance of following up on his blood pressure.  Final Clinical Impressions(s) / ED Diagnoses   Final diagnoses:  Hypertension, unspecified type  Nausea and vomiting, intractability of vomiting not specified, unspecified vomiting type  Acute gastritis without hemorrhage, unspecified gastritis type    ED Discharge Orders         Ordered    dicyclomine (BENTYL) 20 MG tablet  2 times daily     03/25/18 1746    amLODipine (NORVASC) 10 MG tablet  Daily     03/25/18 1746           Linwood DibblesKnapp, Tyeshia Cornforth, MD 03/25/18 1750

## 2018-03-25 NOTE — ED Notes (Signed)
Pt ambulatory to restroom

## 2018-03-25 NOTE — ED Notes (Signed)
Pt placing finger down throat intentionally trying to throw up in lobby. Pt making loud vomiting noises. This NT notified Emily(RN).

## 2018-03-25 NOTE — ED Notes (Signed)
Pt advised he is hurting and does not feel any better.

## 2018-10-07 ENCOUNTER — Other Ambulatory Visit: Payer: Self-pay

## 2018-10-07 ENCOUNTER — Encounter (HOSPITAL_COMMUNITY): Payer: Self-pay | Admitting: Emergency Medicine

## 2018-10-07 DIAGNOSIS — J45909 Unspecified asthma, uncomplicated: Secondary | ICD-10-CM | POA: Insufficient documentation

## 2018-10-07 DIAGNOSIS — Z87891 Personal history of nicotine dependence: Secondary | ICD-10-CM | POA: Insufficient documentation

## 2018-10-07 DIAGNOSIS — Z79899 Other long term (current) drug therapy: Secondary | ICD-10-CM | POA: Insufficient documentation

## 2018-10-07 DIAGNOSIS — F12188 Cannabis abuse with other cannabis-induced disorder: Secondary | ICD-10-CM | POA: Insufficient documentation

## 2018-10-07 DIAGNOSIS — Z5329 Procedure and treatment not carried out because of patient's decision for other reasons: Secondary | ICD-10-CM | POA: Insufficient documentation

## 2018-10-07 LAB — CBC
HCT: 49.8 % (ref 39.0–52.0)
Hemoglobin: 17 g/dL (ref 13.0–17.0)
MCH: 31 pg (ref 26.0–34.0)
MCHC: 34.1 g/dL (ref 30.0–36.0)
MCV: 90.7 fL (ref 80.0–100.0)
Platelets: 408 10*3/uL — ABNORMAL HIGH (ref 150–400)
RBC: 5.49 MIL/uL (ref 4.22–5.81)
RDW: 12.7 % (ref 11.5–15.5)
WBC: 19.8 10*3/uL — ABNORMAL HIGH (ref 4.0–10.5)
nRBC: 0 % (ref 0.0–0.2)

## 2018-10-07 LAB — COMPREHENSIVE METABOLIC PANEL
ALT: 44 U/L (ref 0–44)
AST: 30 U/L (ref 15–41)
Albumin: 4.9 g/dL (ref 3.5–5.0)
Alkaline Phosphatase: 64 U/L (ref 38–126)
Anion gap: 12 (ref 5–15)
BUN: 31 mg/dL — ABNORMAL HIGH (ref 6–20)
CO2: 26 mmol/L (ref 22–32)
Calcium: 9.4 mg/dL (ref 8.9–10.3)
Chloride: 95 mmol/L — ABNORMAL LOW (ref 98–111)
Creatinine, Ser: 2.43 mg/dL — ABNORMAL HIGH (ref 0.61–1.24)
GFR calc Af Amer: 34 mL/min — ABNORMAL LOW (ref >60)
GFR calc non Af Amer: 29 mL/min — ABNORMAL LOW (ref >60)
Glucose, Bld: 109 mg/dL — ABNORMAL HIGH (ref 70–99)
Potassium: 3.6 mmol/L (ref 3.5–5.1)
Sodium: 133 mmol/L — ABNORMAL LOW (ref 135–145)
Total Bilirubin: 1.4 mg/dL — ABNORMAL HIGH (ref 0.3–1.2)
Total Protein: 8.4 g/dL — ABNORMAL HIGH (ref 6.5–8.1)

## 2018-10-07 LAB — LIPASE, BLOOD: Lipase: 28 U/L (ref 11–51)

## 2018-10-07 MED ORDER — SODIUM CHLORIDE 0.9% FLUSH: 3.0000 mL | Freq: Once | INTRAVENOUS | Status: DC

## 2018-10-07 NOTE — ED Triage Notes (Signed)
Patient complaining of abdominal pain and emesis. Patient states he has to make his vomit because his body is rejecting water. Patient thinks that he is dehydrated. Patient quit smoking pot two days ago.

## 2018-10-08 ENCOUNTER — Emergency Department (HOSPITAL_COMMUNITY)
Admission: EM | Admit: 2018-10-08 | Discharge: 2018-10-08 | Discharge status: Against Medical Advice | Payer: Self-pay | Attending: Emergency Medicine | Admitting: Emergency Medicine

## 2018-10-08 DIAGNOSIS — R112 Nausea with vomiting, unspecified: Secondary | ICD-10-CM

## 2018-10-08 DIAGNOSIS — R1116 Cannabis hyperemesis syndrome: Secondary | ICD-10-CM

## 2018-10-08 MED ORDER — SODIUM CHLORIDE 0.9 % IV BOLUS: 1000.0000 mL | Freq: Once | INTRAVENOUS | Status: DC

## 2018-10-08 NOTE — ED Provider Notes (Signed)
[facility] DEPT Provider Note   CSN: [id number] Arrival date & time: 10/07/18  2102     History   Chief Complaint Chief Complaint  Patient presents with  . Emesis    HPI Devin Chan is a 53 y.o. male with a past medical history of asthma, depression, marijuana use who presents today for evaluation of generalized abdominal pain.  He reports that he has had this many times before.  He went to his primary care doctor who told him to stop using marijuana.  He states that his symptoms have been worse over the past 4 days.  He stopped marijuana use 2 days ago.  He states that he takes "about 50 hot baths a day."  As that provides him with some nausea relief.  He states that he feels like his organs are dehydrated.  He denies any fevers.  No blood in his vomit or bowel movements.  He states that this pain feels consistent with his usual episodes of abdominal pain.  He states that he took zofran and that helped for a while.       HPI  Past Medical History:  Diagnosis Date  . Arthritis   . Asthma   . Depression     Patient Active Problem List   Diagnosis Date Noted  . Upper GI bleed 01/02/2014  . Hematemesis 01/02/2014  . Epigastric pain     Past Surgical History:  Procedure Laterality Date  . CLEFT PALATE REPAIR  age 9   [facility]  . NO PAST SURGERIES          Home Medications    Prior to Admission medications   Medication Sig Start Date End Date Taking? Authorizing Provider  amLODipine (NORVASC) 10 MG tablet Take 1 tablet (10 mg total) by mouth daily. 03/25/18   Dorie Rank, MD  dicyclomine (BENTYL) 20 MG tablet Take 1 tablet (20 mg total) by mouth 2 (two) times daily. 03/25/18   Dorie Rank, MD  Misc Natural Products (NF FORMULAS TESTOSTERONE PO) Take 1 capsule by mouth daily.    [provider]  omeprazole (PRILOSEC) 20 MG capsule Take 1 capsule (20 mg total) by mouth daily. 01/17/18   Henderly, Britni A, PA-C  ondansetron (ZOFRAN)  4 MG tablet Take 1 tablet (4 mg total) by mouth every 6 (six) hours. 01/17/18   Henderly, Britni A, PA-C  promethazine (PHENERGAN) 25 MG tablet Take 1 tablet (25 mg total) by mouth every 6 (six) hours as needed for nausea or vomiting. 03/23/18   Albesa Seen, PA-C    Family History Family History  Problem Relation Age of Onset  . Drug abuse Father   . Stroke Father   . Hypertension Father     Social History Social History   Tobacco Use  . Smoking status: Former Smoker    Packs/day: 1.00    Years: 30.00    Pack years: 30.00    Types: Cigarettes    Quit date: 05/01/2011    Years since quitting: 7.4  . Smokeless tobacco: Never Used  . Tobacco comment: quit x 6 months at age 36  Substance Use Topics  . Alcohol use: Yes    Alcohol/week: 27.0 standard drinks    Types: 15 Cans of beer, 12 Standard drinks or equivalent per week  . Drug use: Yes    Types: Marijuana     Allergies   Patient has no known allergies.   Review of Systems Review of Systems  Constitutional: Negative for chills and fever.  HENT: Negative for congestion.   Eyes: Negative for visual disturbance.  Respiratory: Negative for shortness of breath.   Gastrointestinal: Positive for abdominal pain, nausea and vomiting. Negative for blood in stool and constipation.  Genitourinary: Positive for decreased urine volume. Negative for dysuria, hematuria and urgency.  Musculoskeletal: Negative for back pain and neck pain.  Neurological: Negative for weakness and headaches.  Psychiatric/Behavioral: Negative for confusion.  All other systems reviewed and are negative.    Physical Exam Updated Vital Signs BP (!) 129/93 (BP Location: Left Arm)   Pulse 97   Temp 99.9 F (37.7 C) (Oral)   Resp 18   Ht 5' 6.5" (1.689 m)   Wt 60.8 kg   SpO2 99%   BMI 21.32 kg/m   Physical Exam Vitals signs and nursing note reviewed.  Constitutional:      General: He is not in acute distress.    Appearance: He is  well-developed. He is not diaphoretic.  HENT:     Head: Normocephalic and atraumatic.  Eyes:     General: No scleral icterus.       Right eye: No discharge.        Left eye: No discharge.     Conjunctiva/sclera: Conjunctivae normal.  Neck:     Musculoskeletal: Normal range of motion.  Cardiovascular:     Rate and Rhythm: Normal rate and regular rhythm.  Pulmonary:     Effort: Pulmonary effort is normal. No respiratory distress.     Breath sounds: No stridor.  Abdominal:     General: Abdomen is flat. There is no distension.     Tenderness: There is abdominal tenderness (Diffuse).  Musculoskeletal:        General: No deformity.  Skin:    General: Skin is warm and dry.  Neurological:     General: No focal deficit present.     Mental Status: He is alert.     Motor: No abnormal muscle tone.  Psychiatric:        Mood and Affect: Affect is angry.        Speech: Speech normal.        Behavior: Behavior is agitated. Behavior is cooperative.      ED Treatments / Results  Labs (all labs ordered are listed, but only abnormal results are displayed) Labs Reviewed  COMPREHENSIVE METABOLIC PANEL - Abnormal; Notable for the following components:      Result Value   Sodium 133 (*)    Chloride 95 (*)    Glucose, Bld 109 (*)    BUN 31 (*)    Creatinine, Ser 2.43 (*)    Total Protein 8.4 (*)    Total Bilirubin 1.4 (*)    GFR calc non Af Amer 29 (*)    GFR calc Af Amer 34 (*)    All other components within normal limits  CBC - Abnormal; Notable for the following components:   WBC 19.8 (*)    Platelets 408 (*)    All other components within normal limits  LIPASE, BLOOD  URINALYSIS, ROUTINE W REFLEX MICROSCOPIC    EKG None  Radiology No results found.  Procedures Procedures (including critical care time)  Medications Ordered in ED Medications  sodium chloride flush (NS) 0.9 % injection 3 mL (has no administration in time range)  sodium chloride 0.9 % bolus 1,000 mL  (1,000 mLs Intravenous Refused 10/08/18 0444)  sodium chloride 0.9 % bolus 1,000 mL (1,000 mLs Intravenous  Refused 10/08/18 0444)     Initial Impression / Assessment and Plan / ED Course  I have reviewed the triage vital signs and the nursing notes.  Pertinent labs & imaging results that were available during my care of the patient were reviewed by me and considered in my medical decision making (see chart for details).       Patient presents today for evaluation of diffuse abdominal pain, nausea, and vomiting.  He reports marijuana use and that his symptoms are significantly improved with hot baths consistent/concerning for cannabinoid hyperemesis.  Labs are obtained and reviewed.  He does appear dehydrated with evidence of an AKI.  His creatinine is 2.43.  His white count is significantly elevated at 19.8 which appears consistent with his baseline.  He is afebrile and not hypotensive.  2 L of IV fluids were ordered with plan to reassess patient after fluids.  According to notes patient became agitated and left.  He did this before I could advise him of the possible risks of making this decision as he did not give adequate notice before leaving.    Final Clinical Impressions(s) / ED Diagnoses   Final diagnoses:  Cannabinoid hyperemesis syndrome [facility](HCC)    ED Discharge Orders    None       Cristina GongHammond, Ramere Downs W, [place] JerseyPA-C 10/08/18 0516    Gilda CreasePollina, Christopher J, MD 10/08/18 2322

## 2018-10-08 NOTE — ED Notes (Signed)
Py standing in doorway staring at staff. Jenel Lucks, RN asked pt to step back in his room and pt became upset. Pt started yelling and was asked to stop yelling. Pt stated "Well I'm just going to leave then."

## 2018-10-08 NOTE — ED Notes (Signed)
Patient starts to yell as the nurse is trying to get an IV. Patient states he is leaving and yelled some cuss words. Patient stormed out the door.

## 2018-10-08 NOTE — ED Notes (Signed)
Pt called to the room and informed staff he had fallen asleep sitting outside.

## 2019-04-02 ENCOUNTER — Encounter: Payer: Self-pay | Admitting: Emergency Medicine

## 2019-04-02 ENCOUNTER — Emergency Department: Payer: Self-pay

## 2019-04-02 ENCOUNTER — Other Ambulatory Visit: Payer: Self-pay

## 2019-04-02 ENCOUNTER — Inpatient Hospital Stay
Admission: EM | Admit: 2019-04-02 | Discharge: 2019-04-05 | DRG: 682 | Disposition: A | Discharge status: Home / Self Care | Payer: Self-pay | Attending: Internal Medicine | Admitting: Internal Medicine

## 2019-04-02 DIAGNOSIS — F17210 Nicotine dependence, cigarettes, uncomplicated: Secondary | ICD-10-CM | POA: Diagnosis present

## 2019-04-02 DIAGNOSIS — F1210 Cannabis abuse, uncomplicated: Secondary | ICD-10-CM | POA: Insufficient documentation

## 2019-04-02 DIAGNOSIS — K2900 Acute gastritis without bleeding: Secondary | ICD-10-CM

## 2019-04-02 DIAGNOSIS — E8729 Other acidosis: Secondary | ICD-10-CM

## 2019-04-02 DIAGNOSIS — F1290 Cannabis use, unspecified, uncomplicated: Secondary | ICD-10-CM

## 2019-04-02 DIAGNOSIS — Z79899 Other long term (current) drug therapy: Secondary | ICD-10-CM

## 2019-04-02 DIAGNOSIS — N179 Acute kidney failure, unspecified: Principal | ICD-10-CM | POA: Diagnosis present

## 2019-04-02 DIAGNOSIS — Z20822 Contact with and (suspected) exposure to covid-19: Secondary | ICD-10-CM | POA: Diagnosis present

## 2019-04-02 DIAGNOSIS — R7989 Other specified abnormal findings of blood chemistry: Secondary | ICD-10-CM

## 2019-04-02 DIAGNOSIS — K2971 Gastritis, unspecified, with bleeding: Secondary | ICD-10-CM | POA: Diagnosis present

## 2019-04-02 DIAGNOSIS — N182 Chronic kidney disease, stage 2 (mild): Secondary | ICD-10-CM | POA: Diagnosis present

## 2019-04-02 DIAGNOSIS — K2270 Barrett's esophagus without dysplasia: Secondary | ICD-10-CM

## 2019-04-02 DIAGNOSIS — N1831 Chronic kidney disease, stage 3a: Secondary | ICD-10-CM

## 2019-04-02 DIAGNOSIS — I10 Essential (primary) hypertension: Secondary | ICD-10-CM

## 2019-04-02 DIAGNOSIS — E860 Dehydration: Secondary | ICD-10-CM | POA: Diagnosis present

## 2019-04-02 DIAGNOSIS — K920 Hematemesis: Secondary | ICD-10-CM

## 2019-04-02 DIAGNOSIS — I129 Hypertensive chronic kidney disease with stage 1 through stage 4 chronic kidney disease, or unspecified chronic kidney disease: Secondary | ICD-10-CM | POA: Diagnosis present

## 2019-04-02 DIAGNOSIS — Z8711 Personal history of peptic ulcer disease: Secondary | ICD-10-CM

## 2019-04-02 DIAGNOSIS — Z823 Family history of stroke: Secondary | ICD-10-CM

## 2019-04-02 DIAGNOSIS — K922 Gastrointestinal hemorrhage, unspecified: Secondary | ICD-10-CM

## 2019-04-02 DIAGNOSIS — D72829 Elevated white blood cell count, unspecified: Secondary | ICD-10-CM

## 2019-04-02 DIAGNOSIS — R739 Hyperglycemia, unspecified: Secondary | ICD-10-CM | POA: Diagnosis present

## 2019-04-02 DIAGNOSIS — Z9114 Patient's other noncompliance with medication regimen: Secondary | ICD-10-CM

## 2019-04-02 DIAGNOSIS — R778 Other specified abnormalities of plasma proteins: Secondary | ICD-10-CM

## 2019-04-02 DIAGNOSIS — R1110 Vomiting, unspecified: Secondary | ICD-10-CM

## 2019-04-02 DIAGNOSIS — R112 Nausea with vomiting, unspecified: Secondary | ICD-10-CM | POA: Diagnosis present

## 2019-04-02 DIAGNOSIS — Z8249 Family history of ischemic heart disease and other diseases of the circulatory system: Secondary | ICD-10-CM

## 2019-04-02 DIAGNOSIS — E872 Acidosis: Secondary | ICD-10-CM | POA: Diagnosis present

## 2019-04-02 LAB — URINALYSIS, COMPLETE (UACMP) WITH MICROSCOPIC
Bilirubin Urine: NEGATIVE
Glucose, UA: NEGATIVE mg/dL
Ketones, ur: NEGATIVE mg/dL
Leukocytes,Ua: NEGATIVE
Nitrite: NEGATIVE
Protein, ur: 100 mg/dL — AB
Specific Gravity, Urine: 1.02 (ref 1.005–1.030)
pH: 5 (ref 5.0–8.0)

## 2019-04-02 LAB — BASIC METABOLIC PANEL
Anion gap: 13 (ref 5–15)
BUN: 49 mg/dL — ABNORMAL HIGH (ref 6–20)
CO2: 21 mmol/L — ABNORMAL LOW (ref 22–32)
Calcium: 8.8 mg/dL — ABNORMAL LOW (ref 8.9–10.3)
Chloride: 102 mmol/L (ref 98–111)
Creatinine, Ser: 5.16 mg/dL — ABNORMAL HIGH (ref 0.61–1.24)
GFR calc Af Amer: 14 mL/min — ABNORMAL LOW (ref >60)
GFR calc non Af Amer: 12 mL/min — ABNORMAL LOW (ref >60)
Glucose, Bld: 131 mg/dL — ABNORMAL HIGH (ref 70–99)
Potassium: 4.2 mmol/L (ref 3.5–5.1)
Sodium: 136 mmol/L (ref 135–145)

## 2019-04-02 LAB — COMPREHENSIVE METABOLIC PANEL
ALT: 22 U/L (ref 0–44)
AST: 51 U/L — ABNORMAL HIGH (ref 15–41)
Albumin: 5.8 g/dL — ABNORMAL HIGH (ref 3.5–5.0)
Alkaline Phosphatase: 67 U/L (ref 38–126)
Anion gap: 24 — ABNORMAL HIGH (ref 5–15)
BUN: 44 mg/dL — ABNORMAL HIGH (ref 6–20)
CO2: 14 mmol/L — ABNORMAL LOW (ref 22–32)
Calcium: 11 mg/dL — ABNORMAL HIGH (ref 8.9–10.3)
Chloride: 94 mmol/L — ABNORMAL LOW (ref 98–111)
Creatinine, Ser: 6.33 mg/dL — ABNORMAL HIGH (ref 0.61–1.24)
GFR calc Af Amer: 11 mL/min — ABNORMAL LOW (ref >60)
GFR calc non Af Amer: 9 mL/min — ABNORMAL LOW (ref >60)
Glucose, Bld: 189 mg/dL — ABNORMAL HIGH (ref 70–99)
Potassium: 3.8 mmol/L (ref 3.5–5.1)
Sodium: 132 mmol/L — ABNORMAL LOW (ref 135–145)
Total Bilirubin: 1.5 mg/dL — ABNORMAL HIGH (ref 0.3–1.2)
Total Protein: 10.4 g/dL — ABNORMAL HIGH (ref 6.5–8.1)

## 2019-04-02 LAB — URINE DRUG SCREEN, QUALITATIVE (ARMC ONLY)
Amphetamines, Ur Screen: NOT DETECTED
Barbiturates, Ur Screen: NOT DETECTED
Benzodiazepine, Ur Scrn: NOT DETECTED
Cannabinoid 50 Ng, Ur ~~LOC~~: POSITIVE — AB
Cocaine Metabolite,Ur ~~LOC~~: NOT DETECTED
MDMA (Ecstasy)Ur Screen: NOT DETECTED
Methadone Scn, Ur: NOT DETECTED
Opiate, Ur Screen: NOT DETECTED
Phencyclidine (PCP) Ur S: NOT DETECTED
Tricyclic, Ur Screen: NOT DETECTED

## 2019-04-02 LAB — LACTIC ACID, PLASMA
Lactic Acid, Venous: 1.9 mmol/L (ref 0.5–1.9)
Lactic Acid, Venous: 1.5 mmol/L (ref 0.5–1.9)

## 2019-04-02 LAB — LIPASE, BLOOD: Lipase: 34 U/L (ref 11–51)

## 2019-04-02 LAB — TYPE AND SCREEN
ABO/RH(D): O NEG
Antibody Screen: NEGATIVE

## 2019-04-02 LAB — CBC
HCT: 55.1 % — ABNORMAL HIGH (ref 39.0–52.0)
Hemoglobin: 18.9 g/dL — ABNORMAL HIGH (ref 13.0–17.0)
MCH: 30.2 pg (ref 26.0–34.0)
MCHC: 34.3 g/dL (ref 30.0–36.0)
MCV: 88 fL (ref 80.0–100.0)
Platelets: 498 10*3/uL — ABNORMAL HIGH (ref 150–400)
RBC: 6.26 MIL/uL — ABNORMAL HIGH (ref 4.22–5.81)
RDW: 14.1 % (ref 11.5–15.5)
WBC: 36.6 10*3/uL — ABNORMAL HIGH (ref 4.0–10.5)
nRBC: 0 % (ref 0.0–0.2)

## 2019-04-02 LAB — GLUCOSE, CAPILLARY: Glucose-Capillary: 110 mg/dL — ABNORMAL HIGH (ref 70–99)

## 2019-04-02 LAB — RESPIRATORY PANEL BY RT PCR (FLU A&B, COVID)
Influenza A by PCR: NEGATIVE
Influenza B by PCR: NEGATIVE
SARS Coronavirus 2 by RT PCR: NEGATIVE

## 2019-04-02 LAB — ETHANOL: Alcohol, Ethyl (B): <10 mg/dL (ref <10)

## 2019-04-02 LAB — BETA-HYDROXYBUTYRIC ACID: Beta-Hydroxybutyric Acid: 0.22 mmol/L (ref 0.05–0.27)

## 2019-04-02 MED ORDER — SODIUM CHLORIDE 0.9 % IV SOLN
8.0000 mg/h | INTRAVENOUS | Status: DC
  Administered 2019-04-02 – 2019-04-05 (×6): 8 mg/h via INTRAVENOUS
  Filled 2019-04-02 (×6): qty 80
  Filled 2019-04-02: qty 40

## 2019-04-02 MED ORDER — ACETAMINOPHEN 325 MG PO TABS
650.0000 mg | ORAL_TABLET | Freq: Four times a day (QID) | ORAL | Status: DC | PRN
  Filled 2019-04-02: qty 2

## 2019-04-02 MED ORDER — SODIUM CHLORIDE 0.9 % IV BOLUS
1000.0000 mL | Freq: Once | INTRAVENOUS | Status: AC
  Administered 2019-04-02: 1000 mL via INTRAVENOUS

## 2019-04-02 MED ORDER — SODIUM CHLORIDE 0.9 % IV SOLN
80.0000 mg | Freq: Once | INTRAVENOUS | Status: AC
  Administered 2019-04-02: 80 mg via INTRAVENOUS
  Filled 2019-04-02: qty 80

## 2019-04-02 MED ORDER — PIPERACILLIN-TAZOBACTAM 3.375 G IVPB 30 MIN
3.3750 g | Freq: Once | INTRAVENOUS | Status: AC
  Administered 2019-04-02: 3.375 g via INTRAVENOUS
  Filled 2019-04-02: qty 50

## 2019-04-02 MED ORDER — SODIUM CHLORIDE 0.9 % IV BOLUS
500.0000 mL | Freq: Once | INTRAVENOUS | Status: AC
  Administered 2019-04-02: 500 mL via INTRAVENOUS

## 2019-04-02 MED ORDER — SODIUM CHLORIDE 0.9 % IV SOLN
80.0000 mg | Freq: Once | INTRAVENOUS | Status: DC
  Filled 2019-04-02: qty 80

## 2019-04-02 MED ORDER — ONDANSETRON HCL 4 MG/2ML IJ SOLN
4.0000 mg | Freq: Four times a day (QID) | INTRAMUSCULAR | Status: DC | PRN
  Administered 2019-04-02 – 2019-04-04 (×2): 4 mg via INTRAVENOUS
  Filled 2019-04-02 (×2): qty 2

## 2019-04-02 MED ORDER — FENTANYL CITRATE (PF) 100 MCG/2ML IJ SOLN
50.0000 ug | Freq: Once | INTRAMUSCULAR | Status: DC
  Filled 2019-04-02: qty 2

## 2019-04-02 MED ORDER — ONDANSETRON HCL 4 MG/2ML IJ SOLN
4.0000 mg | Freq: Once | INTRAMUSCULAR | Status: AC
  Administered 2019-04-02: 4 mg via INTRAVENOUS
  Filled 2019-04-02: qty 2

## 2019-04-02 MED ORDER — SODIUM CHLORIDE 0.45 % IV SOLN
INTRAVENOUS | Status: DC
  Filled 2019-04-02 (×5): qty 100

## 2019-04-02 MED ORDER — HYDRALAZINE HCL 20 MG/ML IJ SOLN
10.0000 mg | Freq: Four times a day (QID) | INTRAMUSCULAR | Status: DC | PRN
  Administered 2019-04-02: 10 mg via INTRAVENOUS
  Filled 2019-04-02: qty 1

## 2019-04-02 MED ORDER — PIPERACILLIN-TAZOBACTAM 3.375 G IVPB: 3.3750 g | Freq: Once | INTRAVENOUS | Status: DC

## 2019-04-02 MED ORDER — INSULIN ASPART 100 UNIT/ML ~~LOC~~ SOLN: 0.0000 [IU] | Freq: Three times a day (TID) | SUBCUTANEOUS | Status: DC

## 2019-04-02 MED ORDER — AMLODIPINE BESYLATE 10 MG PO TABS
10.0000 mg | ORAL_TABLET | Freq: Every day | ORAL | Status: DC
  Administered 2019-04-02: 10 mg via ORAL
  Filled 2019-04-02: qty 2

## 2019-04-02 MED ORDER — INSULIN ASPART 100 UNIT/ML ~~LOC~~ SOLN: 0.0000 [IU] | Freq: Every day | SUBCUTANEOUS | Status: DC

## 2019-04-02 NOTE — H&P (Addendum)
[facility] [place] [facility]   PATIENT NAME: Devin Chan    MR#:  [id number]  DATE OF BIRTH:  Dec 08, 1965  DATE OF ADMISSION:  04/02/2019  PRIMARY CARE PHYSICIAN: Patient, No Pcp Per   REQUESTING/REFERRING PHYSICIAN: Dr. Shaune Pollack  Patient coming from : home   CHIEF COMPLAINT:  intractable nausea vomiting for three days  HISTORY OF PRESENT ILLNESS:  Devin Chan  is a 54 y.o. male with a known history of arthritis, asthma, depression, history of chronic marijuana abuse, history of smoking comes to the emergency room with intractable nausea vomiting, unable to take anything orally for last three days  Patient says he cooked some ham three days ago-- he ate some and thereafter several hours later started throwing up. He noticed today he had coffee ground emesis. He was unable to keep water without having to throw up.  ED course: in the ER patient is afebrile, tachycardic, blood pressure 146/112. It is clinically dehydrated. He was having dry heaves and epigastric abdominal pain. Patient was found to be dehydrated creatinine six (last baseline creatinine was 2.4 in August 2020) he was found to have anion gap acidosis with bicarb 14. Patient received couple liters of normal saline bolus. He is being admitted for further evaluation  EDP discussed with G.I. Dr. Tobi Bastos. Patient is on Protonix drip for coffee ground emesis  No hematemesis. Patient had coffee ground emesis earlier. Denies any blood per rectum.  Reports using marijuana three days ago. Has not drank alcohol and several days. Continues to smoke.  PAST MEDICAL HISTORY:   Past Medical History:  Diagnosis Date  . Arthritis   . Asthma   . Depression     PAST SURGICAL HISTOIRY:   Past Surgical History:  Procedure Laterality Date  . CLEFT PALATE REPAIR  age 23   [facility]  . NO PAST SURGERIES      SOCIAL HISTORY:   Social History   Tobacco Use  . Smoking status: Current Every Day Smoker     Packs/day: 1.00    Years: 30.00    Pack years: 30.00    Types: Cigarettes    Last attempt to quit: 05/01/2011    Years since quitting: 7.9  . Smokeless tobacco: Never Used  . Tobacco comment: quit x 6 months at age 50  Substance Use Topics  . Alcohol use: Yes    Alcohol/week: 27.0 standard drinks    Types: 15 Cans of beer, 12 Standard drinks or equivalent per week    FAMILY HISTORY:   Family History  Problem Relation Age of Onset  . Drug abuse Father   . Stroke Father   . Hypertension Father     DRUG ALLERGIES:  No Known Allergies  REVIEW OF SYSTEMS:  Review of Systems  Constitutional: Negative for chills, fever and weight loss.  HENT: Negative for ear discharge, ear pain and nosebleeds.   Eyes: Negative for blurred vision, pain and discharge.  Respiratory: Negative for sputum production, shortness of breath, wheezing and stridor.   Cardiovascular: Negative for chest pain, palpitations, orthopnea and PND.  Gastrointestinal: Positive for abdominal pain, nausea and vomiting. Negative for diarrhea.  Genitourinary: Negative for frequency and urgency.  Musculoskeletal: Negative for back pain and joint pain.  Neurological: Positive for weakness. Negative for sensory change, speech change and focal weakness.  Psychiatric/Behavioral: Negative for depression and hallucinations. The patient is not nervous/anxious.      MEDICATIONS AT HOME:   Prior to Admission medications  Medication Sig Start Date End Date Taking? Authorizing Provider  pantoprazole (PROTONIX) 40 MG tablet Take 40 mg by mouth every morning. 02/05/19  Yes [provider]  amLODipine (NORVASC) 10 MG tablet Take 1 tablet (10 mg total) by mouth daily. Patient not taking: Reported on 04/02/2019 03/25/18   Dorie Rank, MD      VITAL SIGNS:  Blood pressure (!) 146/112, pulse 92, temperature 98.3 F (36.8 C), temperature source Oral, resp. rate (!) 22, height 5\' 6"  (1.676 m), weight 68 kg, SpO2 99  %.  PHYSICAL EXAMINATION:  GENERAL:  54 y.o.-year-old patient lying in the bed with no acute distress. Clinically dehydrated EYES: Pupils equal, round, reactive to light and accommodation. No scleral icterus.  HEENT: Head atraumatic, normocephalic. Oropharynx and nasopharynx clear.  NECK:  Supple, no jugular venous distention. No thyroid enlargement, no tenderness.  LUNGS: Normal breath sounds bilaterally, no wheezing, rales,rhonchi or crepitation. No use of accessory muscles of respiration.  CARDIOVASCULAR: S1, S2 normal. No murmurs, rubs, or gallops.  ABDOMEN: Soft, nontender, nondistended. Bowel sounds present. No organomegaly or mass.  EXTREMITIES: No pedal edema, cyanosis, or clubbing.  NEUROLOGIC: Cranial nerves II through XII are intact. Muscle strength 5/5 in all extremities. Sensation intact. Gait not checked.  PSYCHIATRIC: The patient is alert and oriented x 3.  SKIN: No obvious rash, lesion, or ulcer.   LABORATORY PANEL:   CBC Recent Labs  Lab 04/02/19 1215  WBC 36.6*  HGB 18.9*  HCT 55.1*  PLT 498*   ------------------------------------------------------------------------------------------------------------------  Chemistries  Recent Labs  Lab 04/02/19 1215  NA 132*  K 3.8  CL 94*  CO2 14*  GLUCOSE 189*  BUN 44*  CREATININE 6.33*  CALCIUM 11.0*  AST 51*  ALT 22  ALKPHOS 67  BILITOT 1.5*   ------------------------------------------------------------------------------------------------------------------  Cardiac Enzymes No results for input(s): TROPONINI in the last 168 hours. ------------------------------------------------------------------------------------------------------------------  RADIOLOGY:  CT ABDOMEN PELVIS WO CONTRAST  Result Date: 04/02/2019 CLINICAL DATA:  54 year old male with abdominal distention, nausea vomiting. EXAM: CT CHEST, ABDOMEN AND PELVIS WITHOUT CONTRAST TECHNIQUE: Multidetector CT imaging of the chest, abdomen and pelvis  was performed following the standard protocol without IV contrast. COMPARISON:  Chest radiograph dated 04/02/2019 and CT abdomen pelvis dated 03/23/2018. FINDINGS: Evaluation of this exam is limited in the absence of intravenous contrast. CT CHEST FINDINGS Cardiovascular: There is no cardiomegaly or pericardial effusion. There is coronary vascular calcification of the LAD. The thoracic aorta and central pulmonary arteries are grossly unremarkable on this noncontrast CT. Mediastinum/Nodes: There is no hilar or mediastinal adenopathy. The esophagus is grossly unremarkable. No mediastinal fluid collection. Lungs/Pleura: The lungs are clear. There is no pleural effusion or pneumothorax. The central airways are patent. Musculoskeletal: No chest wall mass or suspicious bone lesions identified. CT ABDOMEN PELVIS FINDINGS No intra-abdominal free air or free fluid. Hepatobiliary: No focal liver abnormality is seen. No gallstones, gallbladder wall thickening, or biliary dilatation. Pancreas: Unremarkable. No pancreatic ductal dilatation or surrounding inflammatory changes. Spleen: Normal in size without focal abnormality. Adrenals/Urinary Tract: The adrenal glands are unremarkable. There is no hydronephrosis or nephrolithiasis on either side. Subcentimeter faintly visualized hypodense lesion in the inferior pole of the left kidney is not characterized on this CT but was present on the prior CT and likely represents a cyst. Ultrasound may provide better evaluation. The visualized ureters and urinary bladder appear unremarkable. Stomach/Bowel: There is no bowel obstruction or active inflammation. The appendix is normal. Vascular/Lymphatic: Mild aortoiliac atherosclerotic disease. The IVC is unremarkable. No  portal venous gas. There is no adenopathy. Reproductive: The prostate and seminal vesicles are grossly unremarkable. No pelvic mass. 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  There is disc desiccation and diffuse disc bulge at L5-S1. No acute osseous pathology. IMPRESSION: 1. No acute intrathoracic, abdominal, or pelvic pathology. 2. Mild Aortic Atherosclerosis (ICD10-I70.0). Electronically Signed   By: Elgie Collard M.D.   On: 04/02/2019 15:29   CT Chest Wo Contrast  Result Date: 04/02/2019 CLINICAL DATA:  54 year old male with abdominal distention, nausea vomiting. EXAM: CT CHEST, ABDOMEN AND PELVIS WITHOUT CONTRAST TECHNIQUE: Multidetector CT imaging of the chest, abdomen and pelvis was performed following the standard protocol without IV contrast. COMPARISON:  Chest radiograph dated 04/02/2019 and CT abdomen pelvis dated 03/23/2018. FINDINGS: Evaluation of this exam is limited in the absence of intravenous contrast. CT CHEST FINDINGS Cardiovascular: There is no cardiomegaly or pericardial effusion. There is coronary vascular calcification of the LAD. The thoracic aorta and central pulmonary arteries are grossly unremarkable on this noncontrast CT. Mediastinum/Nodes: There is no hilar or mediastinal adenopathy. The esophagus is grossly unremarkable. No mediastinal fluid collection. Lungs/Pleura: The lungs are clear. There is no pleural effusion or pneumothorax. The central airways are patent. Musculoskeletal: No chest wall mass or suspicious bone lesions identified. CT ABDOMEN PELVIS FINDINGS No intra-abdominal free air or free fluid. Hepatobiliary: No focal liver abnormality is seen. No gallstones, gallbladder wall thickening, or biliary dilatation. Pancreas: Unremarkable. No pancreatic ductal dilatation or surrounding inflammatory changes. Spleen: Normal in size without focal abnormality. Adrenals/Urinary Tract: The adrenal glands are unremarkable. There is no hydronephrosis or nephrolithiasis on either side. Subcentimeter faintly visualized hypodense lesion in the inferior pole of the left kidney is not characterized on this CT but was present on the prior CT and likely  represents a cyst. Ultrasound may provide better evaluation. The visualized ureters and urinary bladder appear unremarkable. Stomach/Bowel: There is no bowel obstruction or active inflammation. The appendix is normal. Vascular/Lymphatic: Mild aortoiliac atherosclerotic disease. The IVC is unremarkable. No portal venous gas. There is no adenopathy. Reproductive: The prostate and seminal vesicles are grossly unremarkable. No pelvic mass. 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 There is disc desiccation and diffuse disc bulge at L5-S1. No acute osseous pathology. IMPRESSION: 1. No acute intrathoracic, abdominal, or pelvic pathology. 2. Mild Aortic Atherosclerosis (ICD10-I70.0). Electronically Signed   By: Elgie Collard M.D.   On: 04/02/2019 15:29   DG Chest Portable 1 View  Result Date: 04/02/2019 CLINICAL DATA:  Nausea and vomiting for the last 3 days. EXAM: PORTABLE CHEST 1 VIEW COMPARISON:  None. FINDINGS: The cardiac silhouette, mediastinal and hilar contours are normal. The lungs are clear. No pleural effusion. The bony thorax is intact. IMPRESSION: No acute cardiopulmonary findings. Electronically Signed   By: Rudie Meyer M.D.   On: 04/02/2019 15:04    EKG:    IMPRESSION AND PLAN:   Nikalas Bramel  is a 54 y.o. male with a known history of arthritis, asthma, depression, history of chronic marijuana abuse, history of smoking comes to the emergency room with intractable nausea vomiting, unable to take anything orally for last three days  1. Intractable nausea vomiting suspected due to food poisoning with acute gastritis and coffee ground emesis in the setting of marijuana use-- chronic -admit to medical floor -IV fluids-IV Protonix drip -G.I. consultation with Dr. Tobi Bastos-- aware -avoid NSAIDs -PRN antiemetics -monitor H&H  2. 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 losses and starvation acidosis -normal saline with bicarb -monitor metabolic panel Q 12 -input output -consider nephrology consultation if creatinine does not trend down -patient's baseline creatinine is 2.4(August 2020)  3. Chronic marijuana use -advised to abstain -check urine drug screen  4. Hypertension-malignant/untreated -patient has been noncompliant with his medications -will give hydralazine and Norvasc  5. Chronic leukocytosis -patient is not aware of elevated white count -he has no fever any source of infection -his white count remains around 19 to 20,000 -comes in with white count of 36,000 could be reactive in the setting of intractable nausea and vomiting -monitor CBC  6. DVT prophylaxis SCD  7. Hyperglycemia -check A1c -SSI   Family Communication : patient Consults : G.I. Code Status : full code DVT prophylaxis : SCD  TOTAL TIME TAKING CARE OF THIS PATIENT: *50* minutes.    Enedina Finner M.D  [facility]     CC: Primary care physician; Patient, No Pcp Per

## 2019-04-02 NOTE — ED Notes (Signed)
Pink tube sent with labs. Lab called that if type and screen is needed, pt will need recollect

## 2019-04-02 NOTE — ED Provider Notes (Addendum)
[facility] Emergency Department Provider Note  ____________________________________________   First MD Initiated Contact with Patient 04/02/19 1413     (approximate)  I have reviewed the triage vital signs and the nursing notes.   HISTORY  Chief Complaint Emesis    HPI Devin Chan is a 54 y.o. male with past medical history as below here with severe abdominal pain, nausea, and vomiting.  The patient states that symptoms started 3 days ago.  He states that he felt like he got food poisoning, then began vomiting and having profuse diarrhea.  The diarrhea is resolved but he said persistent vomiting over the last several days.  Has been unable to eat or drink.  He said associated generalized weakness, cramping, and weakness.  He essentially was unable to walk on his own until his friend came to check on him today.  He states that over the last 24 hours, he is also began to develop dark black, and occasionally bloody emesis.  Has a history of ulcers with similar symptoms.  Denies any recent heavy alcohol use.  Denies any NSAID use.  He states he has some aching, throbbing, left chest and upper abdominal pain.  No alleviating factors.        Past Medical History:  Diagnosis Date  . Arthritis   . Asthma   . Depression     Patient Active Problem List   Diagnosis Date Noted  . Upper GI bleed 01/02/2014  . Hematemesis 01/02/2014  . Epigastric pain     Past Surgical History:  Procedure Laterality Date  . CLEFT PALATE REPAIR  age 89   [facility]  . NO PAST SURGERIES      Prior to Admission medications   Medication Sig Start Date End Date Taking? Authorizing Provider  amLODipine (NORVASC) 10 MG tablet Take 1 tablet (10 mg total) by mouth daily. 03/25/18   Dorie Rank, MD  dicyclomine (BENTYL) 20 MG tablet Take 1 tablet (20 mg total) by mouth 2 (two) times daily. 03/25/18   Dorie Rank, MD  Misc Natural Products (NF FORMULAS TESTOSTERONE PO) Take 1 capsule by  mouth daily.    [provider]  omeprazole (PRILOSEC) 20 MG capsule Take 1 capsule (20 mg total) by mouth daily. 01/17/18   Henderly, Britni A, PA-C  ondansetron (ZOFRAN) 4 MG tablet Take 1 tablet (4 mg total) by mouth every 6 (six) hours. 01/17/18   Henderly, Britni A, PA-C  promethazine (PHENERGAN) 25 MG tablet Take 1 tablet (25 mg total) by mouth every 6 (six) hours as needed for nausea or vomiting. 03/23/18   Langston Masker B, PA-C    Allergies Patient has no known allergies.  Family History  Problem Relation Age of Onset  . Drug abuse Father   . Stroke Father   . Hypertension Father     Social History Social History   Tobacco Use  . Smoking status: Current Every Day Smoker    Packs/day: 1.00    Years: 30.00    Pack years: 30.00    Types: Cigarettes    Last attempt to quit: 05/01/2011    Years since quitting: 7.9  . Smokeless tobacco: Never Used  . Tobacco comment: quit x 6 months at age 86  Substance Use Topics  . Alcohol use: Yes    Alcohol/week: 27.0 standard drinks    Types: 15 Cans of beer, 12 Standard drinks or equivalent per week  . Drug use: Yes    Types: Marijuana  Review of Systems  Review of Systems  Constitutional: Positive for fatigue. Negative for chills and fever.  HENT: Negative for sore throat.   Respiratory: Negative for shortness of breath.   Cardiovascular: Negative for chest pain.  Gastrointestinal: Positive for abdominal pain, diarrhea, nausea and vomiting.  Genitourinary: Negative for flank pain.  Musculoskeletal: Negative for neck pain.  Skin: Negative for rash and wound.  Allergic/Immunologic: Negative for immunocompromised state.  Neurological: Positive for weakness. Negative for numbness.  Hematological: Does not bruise/bleed easily.  All other systems reviewed and are negative.    ____________________________________________  PHYSICAL EXAM:      VITAL SIGNS: ED Triage Vitals  Enc Vitals Group     BP 04/02/19 1206  96/82     Pulse Rate 04/02/19 1206 80     Resp 04/02/19 1206 18     Temp 04/02/19 1206 98.3 F (36.8 C)     Temp Source 04/02/19 1206 Oral     SpO2 04/02/19 1206 98 %     Weight 04/02/19 1207 150 lb (68 kg)     Height 04/02/19 1207 5\' 6"  (1.676 m)     Head Circumference --      Peak Flow --      Pain Score 04/02/19 1207 10     Pain Loc --      Pain Edu? --      Excl. in GC? --      Physical Exam Vitals and nursing note reviewed.  Constitutional:      General: He is not in acute distress.    Appearance: He is well-developed.  HENT:     Head: Normocephalic and atraumatic.     Nose: Nose normal.     Mouth/Throat:     Mouth: Mucous membranes are dry.  Eyes:     Conjunctiva/sclera: Conjunctivae normal.  Cardiovascular:     Rate and Rhythm: Regular rhythm. Tachycardia present.     Heart sounds: Normal heart sounds. No murmur. No friction rub.  Pulmonary:     Effort: Pulmonary effort is normal. Tachypnea present. No respiratory distress.     Breath sounds: Normal breath sounds. No wheezing or rales.  Abdominal:     General: Bowel sounds are normal. There is no distension.     Palpations: Abdomen is soft.     Tenderness: There is generalized abdominal tenderness and tenderness in the epigastric area. There is guarding. There is no rebound.  Musculoskeletal:     Cervical back: Neck supple.  Skin:    General: Skin is warm.     Capillary Refill: Capillary refill takes less than 2 seconds.  Neurological:     Mental Status: He is alert and oriented to person, place, and time.     Motor: No abnormal muscle tone.       ____________________________________________   LABS (all labs ordered are listed, but only abnormal results are displayed)  Labs Reviewed  COMPREHENSIVE METABOLIC PANEL - Abnormal; Notable for the following components:      Result Value   Sodium 132 (*)    Chloride 94 (*)    CO2 14 (*)    Glucose, Bld 189 (*)    BUN 44 (*)    Creatinine, Ser 6.33 (*)      Calcium 11.0 (*)    Total Protein 10.4 (*)    Albumin 5.8 (*)    AST 51 (*)    Total Bilirubin 1.5 (*)    GFR calc non Af Amer 9 (*)  GFR calc Af Amer 11 (*)    Anion gap 24 (*)    All other components within normal limits  CBC - Abnormal; Notable for the following components:   WBC 36.6 (*)    RBC 6.26 (*)    Hemoglobin 18.9 (*)    HCT 55.1 (*)    Platelets 498 (*)    All other components within normal limits  CULTURE, BLOOD (ROUTINE X 2)  CULTURE, BLOOD (ROUTINE X 2)  RESPIRATORY PANEL BY RT PCR (FLU A&B, COVID)  LIPASE, BLOOD  ETHANOL  URINALYSIS, COMPLETE (UACMP) WITH MICROSCOPIC  LACTIC ACID, PLASMA  BETA-HYDROXYBUTYRIC ACID  LACTIC ACID, PLASMA  TYPE AND SCREEN    ____________________________________________  EKG: Normal sinus rhythm, ventricular rate 91.  QRS 81, QTc 430.  No acute ST elevations.  There is subtle ST depression in inferior leads, possibly demand.  No overt ST elevations. ________________________________________  RADIOLOGY All imaging, including plain films, CT scans, and ultrasounds, independently reviewed by me, and interpretations confirmed via formal radiology reads.  ED MD interpretation:   Chest x-ray: Clear, no free air CT chest/abdomen/pelvis: No acute abnormality  Official radiology report(s): CT ABDOMEN PELVIS WO CONTRAST  Result Date: 04/02/2019 CLINICAL DATA:  54 year old male with abdominal distention, nausea vomiting. EXAM: CT CHEST, ABDOMEN AND PELVIS WITHOUT CONTRAST TECHNIQUE: Multidetector CT imaging of the chest, abdomen and pelvis was performed following the standard protocol without IV contrast. COMPARISON:  Chest radiograph dated 04/02/2019 and CT abdomen pelvis dated 03/23/2018. FINDINGS: Evaluation of this exam is limited in the absence of intravenous contrast. CT CHEST FINDINGS Cardiovascular: There is no cardiomegaly or pericardial effusion. There is coronary vascular calcification of the LAD. The thoracic aorta and  central pulmonary arteries are grossly unremarkable on this noncontrast CT. Mediastinum/Nodes: There is no hilar or mediastinal adenopathy. The esophagus is grossly unremarkable. No mediastinal fluid collection. Lungs/Pleura: The lungs are clear. There is no pleural effusion or pneumothorax. The central airways are patent. Musculoskeletal: No chest wall mass or suspicious bone lesions identified. CT ABDOMEN PELVIS FINDINGS No intra-abdominal free air or free fluid. Hepatobiliary: No focal liver abnormality is seen. No gallstones, gallbladder wall thickening, or biliary dilatation. Pancreas: Unremarkable. No pancreatic ductal dilatation or surrounding inflammatory changes. Spleen: Normal in size without focal abnormality. Adrenals/Urinary Tract: The adrenal glands are unremarkable. There is no hydronephrosis or nephrolithiasis on either side. Subcentimeter faintly visualized hypodense lesion in the inferior pole of the left kidney is not characterized on this CT but was present on the prior CT and likely represents a cyst. Ultrasound may provide better evaluation. The visualized ureters and urinary bladder appear unremarkable. Stomach/Bowel: There is no bowel obstruction or active inflammation. The appendix is normal. Vascular/Lymphatic: Mild aortoiliac atherosclerotic disease. The IVC is unremarkable. No portal venous gas. There is no adenopathy. Reproductive: The prostate and seminal vesicles are grossly unremarkable. No pelvic mass. 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 There is disc desiccation and diffuse disc bulge at L5-S1. No acute osseous pathology. IMPRESSION: 1. No acute intrathoracic, abdominal, or pelvic pathology. 2. Mild Aortic Atherosclerosis (ICD10-I70.0). Electronically Signed   By: Elgie Collard M.D.   On: 04/02/2019 15:29   CT Chest Wo Contrast  Result Date: 04/02/2019 CLINICAL DATA:  54 year old male with abdominal distention, nausea  vomiting. EXAM: CT CHEST, ABDOMEN AND PELVIS WITHOUT CONTRAST TECHNIQUE: Multidetector CT imaging of the chest, abdomen and pelvis was performed following the standard protocol without IV contrast. COMPARISON:  Chest radiograph dated 04/02/2019  and CT abdomen pelvis dated 03/23/2018. FINDINGS: Evaluation of this exam is limited in the absence of intravenous contrast. CT CHEST FINDINGS Cardiovascular: There is no cardiomegaly or pericardial effusion. There is coronary vascular calcification of the LAD. The thoracic aorta and central pulmonary arteries are grossly unremarkable on this noncontrast CT. Mediastinum/Nodes: There is no hilar or mediastinal adenopathy. The esophagus is grossly unremarkable. No mediastinal fluid collection. Lungs/Pleura: The lungs are clear. There is no pleural effusion or pneumothorax. The central airways are patent. Musculoskeletal: No chest wall mass or suspicious bone lesions identified. CT ABDOMEN PELVIS FINDINGS No intra-abdominal free air or free fluid. Hepatobiliary: No focal liver abnormality is seen. No gallstones, gallbladder wall thickening, or biliary dilatation. Pancreas: Unremarkable. No pancreatic ductal dilatation or surrounding inflammatory changes. Spleen: Normal in size without focal abnormality. Adrenals/Urinary Tract: The adrenal glands are unremarkable. There is no hydronephrosis or nephrolithiasis on either side. Subcentimeter faintly visualized hypodense lesion in the inferior pole of the left kidney is not characterized on this CT but was present on the prior CT and likely represents a cyst. Ultrasound may provide better evaluation. The visualized ureters and urinary bladder appear unremarkable. Stomach/Bowel: There is no bowel obstruction or active inflammation. The appendix is normal. Vascular/Lymphatic: Mild aortoiliac atherosclerotic disease. The IVC is unremarkable. No portal venous gas. There is no adenopathy. Reproductive: The prostate and seminal vesicles  are grossly unremarkable. No pelvic mass. 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 There is disc desiccation and diffuse disc bulge at L5-S1. No acute osseous pathology. IMPRESSION: 1. No acute intrathoracic, abdominal, or pelvic pathology. 2. Mild Aortic Atherosclerosis (ICD10-I70.0). Electronically Signed   By: Elgie Collard M.D.   On: 04/02/2019 15:29   DG Chest Portable 1 View  Result Date: 04/02/2019 CLINICAL DATA:  Nausea and vomiting for the last 3 days. EXAM: PORTABLE CHEST 1 VIEW COMPARISON:  None. FINDINGS: The cardiac silhouette, mediastinal and hilar contours are normal. The lungs are clear. No pleural effusion. The bony thorax is intact. IMPRESSION: No acute cardiopulmonary findings. Electronically Signed   By: Rudie Meyer M.D.   On: 04/02/2019 15:04    ____________________________________________  PROCEDURES   Procedure(s) performed (including Critical Care):  .Critical Care Performed by: Shaune Pollack, MD Authorized by: Shaune Pollack, MD   Critical care provider statement:    Critical care time (minutes):  35   Critical care time was exclusive of:  Separately billable procedures and treating other patients and teaching time   Critical care was necessary to treat or prevent imminent or life-threatening deterioration of the following conditions:  Cardiac failure, circulatory failure, respiratory failure, sepsis and metabolic crisis   Critical care was time spent personally by me on the following activities:  Development of treatment plan with patient or surrogate, discussions with consultants, evaluation of patient's response to treatment, examination of patient, obtaining history from patient or surrogate, ordering and performing treatments and interventions, ordering and review of laboratory studies, ordering and review of radiographic studies, pulse oximetry, re-evaluation of patient's condition and review of old  charts   I assumed direction of critical care for this patient from another provider in my specialty: no      ____________________________________________  INITIAL IMPRESSION / MDM / ASSESSMENT AND PLAN / ED COURSE  As part of my medical decision making, I reviewed the following data within the electronic MEDICAL RECORD NUMBER Nursing notes reviewed and incorporated, Old chart reviewed, Notes from prior ED visits, and [place] Controlled Substance Database       *  Devin Chan was evaluated in Emergency Department on 04/02/2019 for the symptoms described in the history of present illness. He was evaluated in the context of the global COVID-19 pandemic, which necessitated consideration that the patient might be at risk for infection with the SARS-CoV-2 virus that causes COVID-19. Institutional protocols and algorithms that pertain to the evaluation of patients at risk for COVID-19 are in a state of rapid change based on information released by regulatory bodies including the CDC and federal and state organizations. These policies and algorithms were followed during the patient's care in the ED.  Some ED evaluations and interventions may be delayed as a result of limited staffing during the pandemic.*     Medical Decision Making: 54 year old male here with tachycardia, nausea, and intractable vomiting that has now turned bloody.  On arrival, patient is tachycardic and ill-appearing.  Lab work shows profound metabolic acidosis, as well as acute kidney injury likely due to poor p.o. intake.  He does have a history of gastritis in the past.  His anion gap is likely secondary to his dehydration and possible component of alcoholic ketoacidosis, though beta hydroxybutyrate is pending.  Patient was started initially on fluids, broad-spectrum antibiotics, as well as a Protonix drip.  Given the degree of his leukocytosis, as well as chest pain and shortness of breath, broad imaging obtained which fortunately shows no  evidence of esophageal perforation, peptic ulcer perforation, or other acute abnormality.  He is improving with fluids.  Will admit for hydration, control of GI bleed.  Dr. Tobi Bastos of GI consulted, will see the patient in the morning.  ____________________________________________  FINAL CLINICAL IMPRESSION(S) / ED DIAGNOSES  Final diagnoses:  Dehydration  High anion gap metabolic acidosis  AKI (acute kidney injury) (HCC)  Upper GI bleed  Hematemesis with nausea     MEDICATIONS GIVEN DURING THIS VISIT:  Medications  pantoprazole (PROTONIX) 80 mg in sodium chloride 0.9 % 100 mL IVPB (80 mg Intravenous New Bag/Given 04/02/19 1533)  pantoprazole (PROTONIX) 80 mg in sodium chloride 0.9 % 250 mL (0.32 mg/mL) infusion (has no administration in time range)  fentaNYL (SUBLIMAZE) injection 50 mcg (50 mcg Intravenous Refused 04/02/19 1523)  piperacillin-tazobactam (ZOSYN) IVPB 3.375 g (3.375 g Intravenous New Bag/Given 04/02/19 1531)  sodium chloride 0.9 % bolus 1,000 mL (0 mLs Intravenous Stopped 04/02/19 1332)  sodium chloride 0.9 % bolus 1,000 mL (1,000 mLs Intravenous New Bag/Given 04/02/19 1444)  ondansetron (ZOFRAN) injection 4 mg (4 mg Intravenous Given 04/02/19 1445)  sodium chloride 0.9 % bolus 500 mL (500 mLs Intravenous New Bag/Given 04/02/19 1516)     ED Discharge Orders    None       Note:  This document was prepared using Dragon voice recognition software and may include unintentional dictation errors.   Shaune Pollack, MD 04/02/19 1544    Shaune Pollack, MD 04/02/19 1544

## 2019-04-02 NOTE — Consult Note (Signed)
PHARMACY -  BRIEF ANTIBIOTIC NOTE   Pharmacy has received consult(s) for pip/tazo (intra-abdominal infection) from an ED provider.  The patient's profile has been reviewed for ht/wt/allergies/indication/available labs. NKDA.  One time order(s) placed for: -pip/tazo 3.375 g over 30 minutes  Further antibiotics/pharmacy consults should be ordered by admitting physician if indicated.                       Thank you, Tressie Ellis  Pharmacy Resident 04/02/2019  2:51 PM

## 2019-04-02 NOTE — ED Notes (Signed)
Received VO from Dr. Roxan Hockey to start 1L NS bolus.

## 2019-04-02 NOTE — ED Triage Notes (Signed)
Pt presents to ED via POV c/o dehydration from repeated emesis over the last 3 days. Hx gastritis, gastric ulcer, cannabis and ETOH abuse. Emesis in triage appears to have black streaks.

## 2019-04-03 DIAGNOSIS — I10 Essential (primary) hypertension: Secondary | ICD-10-CM

## 2019-04-03 DIAGNOSIS — D72829 Elevated white blood cell count, unspecified: Secondary | ICD-10-CM

## 2019-04-03 DIAGNOSIS — R778 Other specified abnormalities of plasma proteins: Secondary | ICD-10-CM

## 2019-04-03 DIAGNOSIS — F1290 Cannabis use, unspecified, uncomplicated: Secondary | ICD-10-CM

## 2019-04-03 DIAGNOSIS — N189 Chronic kidney disease, unspecified: Secondary | ICD-10-CM

## 2019-04-03 LAB — GLUCOSE, CAPILLARY
Glucose-Capillary: 116 mg/dL — ABNORMAL HIGH (ref 70–99)
Glucose-Capillary: 102 mg/dL — ABNORMAL HIGH (ref 70–99)
Glucose-Capillary: 83 mg/dL (ref 70–99)
Glucose-Capillary: 82 mg/dL (ref 70–99)

## 2019-04-03 LAB — HEMOGLOBIN A1C
Hgb A1c MFr Bld: 5.5 % (ref 4.8–5.6)
Mean Plasma Glucose: 111.15 mg/dL

## 2019-04-03 LAB — BASIC METABOLIC PANEL
Anion gap: 10 (ref 5–15)
BUN: 39 mg/dL — ABNORMAL HIGH (ref 6–20)
CO2: 22 mmol/L (ref 22–32)
Calcium: 8.7 mg/dL — ABNORMAL LOW (ref 8.9–10.3)
Chloride: 105 mmol/L (ref 98–111)
Creatinine, Ser: 2.42 mg/dL — ABNORMAL HIGH (ref 0.61–1.24)
GFR calc Af Amer: 34 mL/min — ABNORMAL LOW (ref >60)
GFR calc non Af Amer: 29 mL/min — ABNORMAL LOW (ref >60)
Glucose, Bld: 111 mg/dL — ABNORMAL HIGH (ref 70–99)
Potassium: 4.1 mmol/L (ref 3.5–5.1)
Sodium: 137 mmol/L (ref 135–145)

## 2019-04-03 LAB — CBC
HCT: 44.3 % (ref 39.0–52.0)
Hemoglobin: 15.4 g/dL (ref 13.0–17.0)
MCH: 30.5 pg (ref 26.0–34.0)
MCHC: 34.8 g/dL (ref 30.0–36.0)
MCV: 87.7 fL (ref 80.0–100.0)
Platelets: 345 10*3/uL (ref 150–400)
RBC: 5.05 MIL/uL (ref 4.22–5.81)
RDW: 13.8 % (ref 11.5–15.5)
WBC: 25.3 10*3/uL — ABNORMAL HIGH (ref 4.0–10.5)
nRBC: 0 % (ref 0.0–0.2)

## 2019-04-03 MED ORDER — LORAZEPAM 1 MG PO TABS
1.0000 mg | ORAL_TABLET | Freq: Once | ORAL | Status: AC
  Administered 2019-04-03: 1 mg via ORAL
  Filled 2019-04-03: qty 1

## 2019-04-03 MED ORDER — SODIUM CHLORIDE 0.9 % IV SOLN: INTRAVENOUS | Status: DC

## 2019-04-03 MED ORDER — ALBUTEROL SULFATE (2.5 MG/3ML) 0.083% IN NEBU: 3.0000 mL | INHALATION_SOLUTION | Freq: Four times a day (QID) | RESPIRATORY_TRACT | Status: DC | PRN

## 2019-04-03 MED ORDER — AMLODIPINE BESYLATE 10 MG PO TABS
10.0000 mg | ORAL_TABLET | Freq: Every day | ORAL | Status: DC
  Administered 2019-04-03 – 2019-04-05 (×2): 10 mg via ORAL
  Filled 2019-04-03 (×2): qty 1

## 2019-04-03 NOTE — Progress Notes (Signed)
BP 166/103 - per [facility], wait for AM meds, no further orders needed.

## 2019-04-03 NOTE — Progress Notes (Signed)
Patient ID: Devin Chan, male   DOB: Mar 25, 1965, 54 y.o.   MRN: [id number] [facility] PROGRESS NOTE  Devin Chan Dexter KNL:[id number] DOB: 1965/10/03 DOA: 04/02/2019 PCP: Patient, No Pcp Per  HPI/Subjective: Patient feeling better than yesterday.  No further vomiting since yesterday evening.  No abdominal pain.  Patient states he has been having nausea vomiting for a few days now.  Vomited up dark material.  Objective: Vitals:   04/03/19 0623 04/03/19 1140  BP: (!) 166/103 (!) 141/94  Pulse: 93 89  Resp: 18 16  Temp: 98.8 F (37.1 C) 98.6 F (37 C)  SpO2: 99% 99%    Intake/Output Summary (Last 24 hours) at 04/03/2019 1416 Last data filed at 04/03/2019 0800 Gross per 24 hour  Intake 1776.39 ml  Output --  Net 1776.39 ml   Filed Weights   04/02/19 1207  Weight: 68 kg    ROS: Review of Systems  Constitutional: Negative for chills and fever.  Eyes: Negative for blurred vision.  Respiratory: Negative for cough and shortness of breath.   Cardiovascular: Negative for chest pain.  Gastrointestinal: Positive for nausea. Negative for abdominal pain, constipation, diarrhea and vomiting.  Genitourinary: Negative for dysuria.  Musculoskeletal: Negative for joint pain.  Neurological: Negative for dizziness and headaches.   Exam: Physical Exam  Constitutional: He is oriented to person, place, and time.  HENT:  Nose: No mucosal edema.  Mouth/Throat: No oropharyngeal exudate.  Eyes: Pupils are equal, round, and reactive to light. Conjunctivae, EOM and lids are normal.  Neck: Carotid bruit is not present.  Cardiovascular: S1 normal and S2 normal. Exam reveals no gallop.  No murmur heard. Respiratory: No respiratory distress. He has no wheezes. He has no rhonchi. He has no rales.  GI: Soft. Bowel sounds are normal. There is no abdominal tenderness.  Musculoskeletal:     Right ankle: No swelling.     Left ankle: No swelling.  Lymphadenopathy:    He has no cervical  adenopathy.  Neurological: He is alert and oriented to person, place, and time. No cranial nerve deficit.  Skin: Skin is warm. No rash noted. Nails show no clubbing.  Psychiatric: He has a normal mood and affect.      Data Reviewed: Basic Metabolic Panel: Recent Labs  Lab 04/02/19 1215 04/02/19 1727 04/03/19 0455  NA 132* 136 137  K 3.8 4.2 4.1  CL 94* 102 105  CO2 14* 21* 22  GLUCOSE 189* 131* 111*  BUN 44* 49* 39*  CREATININE 6.33* 5.16* 2.42*  CALCIUM 11.0* 8.8* 8.7*   Liver Function Tests: Recent Labs  Lab 04/02/19 1215  AST 51*  ALT 22  ALKPHOS 67  BILITOT 1.5*  PROT 10.4*  ALBUMIN 5.8*   Recent Labs  Lab 04/02/19 1215  LIPASE 34   CBC: Recent Labs  Lab 04/02/19 1215 04/03/19 0455  WBC 36.6* 25.3*  HGB 18.9* 15.4  HCT 55.1* 44.3  MCV 88.0 87.7  PLT 498* 345    CBG: Recent Labs  Lab 04/02/19 2115 04/03/19 0735 04/03/19 1138  GLUCAP 110* 116* 102*    Recent Results (from the past 240 hour(s))  Blood culture (routine x 2)     Status: None (Preliminary result)   Collection Time: 04/02/19  2:23 PM   Specimen: BLOOD  Result Value Ref Range Status   Specimen Description BLOOD BLOOD RIGHT HAND  Final   Special Requests   Final    BOTTLES DRAWN AEROBIC AND ANAEROBIC Blood Culture results may not  be optimal due to an inadequate volume of blood received in culture bottles   Culture   Final    NO GROWTH < 24 HOURS Performed at [facility], [address] [place], [place] [address]    Report Status PENDING  Incomplete  Blood culture (routine x 2)     Status: None (Preliminary result)   Collection Time: 04/02/19  2:24 PM   Specimen: BLOOD  Result Value Ref Range Status   Specimen Description BLOOD BLOOD LEFT ARM  Final   Special Requests   Final    BOTTLES DRAWN AEROBIC AND ANAEROBIC Blood Culture results may not be optimal due to an inadequate volume of blood received in culture bottles   Culture   Final    NO GROWTH < 24  HOURS Performed at [facility], [address] [place], [place] [address]    Report Status PENDING  Incomplete  Respiratory Panel by RT PCR (Flu A&B, Covid) - Nasopharyngeal Swab     Status: None   Collection Time: 04/02/19  3:14 PM   Specimen: Nasopharyngeal Swab  Result Value Ref Range Status   SARS Coronavirus 2 by RT PCR NEGATIVE NEGATIVE Final    Comment: (NOTE) SARS-CoV-2 target nucleic acids are NOT DETECTED. The SARS-CoV-2 RNA is generally detectable in upper respiratoy specimens during the acute phase of infection. The lowest concentration of SARS-CoV-2 viral copies this assay can detect is 131 copies/mL. A negative result does not preclude SARS-Cov-2 infection and should not be used as the sole basis for treatment or other patient management decisions. A negative result may occur with  improper specimen collection/handling, submission of specimen other than nasopharyngeal swab, presence of viral mutation(s) within the areas targeted by this assay, and inadequate number of viral copies (<131 copies/mL). A negative result must be combined with clinical observations, patient history, and epidemiological information. The expected result is Negative. Fact Sheet for Patients:  https://www.moore.com/ Fact Sheet for Healthcare Providers:  https://www.young.biz/ This test is not yet ap proved or cleared by the [place] FDA and  has been authorized for detection and/or diagnosis of SARS-CoV-2 by FDA under an Emergency Use Authorization (EUA). This EUA will remain  in effect (meaning this test can be used) for the duration of the COVID-19 declaration under Section 564(b)(1) of the Act, 21 U.S.C. section 360bbb-3(b)(1), unless the authorization is terminated or revoked sooner.    Influenza A by PCR NEGATIVE NEGATIVE Final   Influenza B by PCR NEGATIVE NEGATIVE Final    Comment: (NOTE) The Xpert Xpress SARS-CoV-2/FLU/RSV assay  is intended as an aid in  the diagnosis of influenza from Nasopharyngeal swab specimens and  should not be used as a sole basis for treatment. Nasal washings and  aspirates are unacceptable for Xpert Xpress SARS-CoV-2/FLU/RSV  testing. Fact Sheet for Patients: https://www.moore.com/ Fact Sheet for Healthcare Providers: https://www.young.biz/ This test is not yet approved or cleared by the [place] FDA and  has been authorized for detection and/or diagnosis of SARS-CoV-2 by  FDA under an Emergency Use Authorization (EUA). This EUA will remain  in effect (meaning this test can be used) for the duration of the  Covid-19 declaration under Section 564(b)(1) of the Act, 21  U.S.C. section 360bbb-3(b)(1), unless the authorization is  terminated or revoked. Performed at [facility], [address], [place], [place] [address]      Studies: CT ABDOMEN PELVIS WO CONTRAST  Result Date: 04/02/2019 CLINICAL DATA:  54 year old male with abdominal distention, nausea  vomiting. EXAM: CT CHEST, ABDOMEN AND PELVIS WITHOUT CONTRAST TECHNIQUE: Multidetector CT imaging of the chest, abdomen and pelvis was performed following the standard protocol without IV contrast. COMPARISON:  Chest radiograph dated 04/02/2019 and CT abdomen pelvis dated 03/23/2018. FINDINGS: Evaluation of this exam is limited in the absence of intravenous contrast. CT CHEST FINDINGS Cardiovascular: There is no cardiomegaly or pericardial effusion. There is coronary vascular calcification of the LAD. The thoracic aorta and central pulmonary arteries are grossly unremarkable on this noncontrast CT. Mediastinum/Nodes: There is no hilar or mediastinal adenopathy. The esophagus is grossly unremarkable. No mediastinal fluid collection. Lungs/Pleura: The lungs are clear. There is no pleural effusion or pneumothorax. The central airways are patent. Musculoskeletal: No chest wall mass or suspicious  bone lesions identified. CT ABDOMEN PELVIS FINDINGS No intra-abdominal free air or free fluid. Hepatobiliary: No focal liver abnormality is seen. No gallstones, gallbladder wall thickening, or biliary dilatation. Pancreas: Unremarkable. No pancreatic ductal dilatation or surrounding inflammatory changes. Spleen: Normal in size without focal abnormality. Adrenals/Urinary Tract: The adrenal glands are unremarkable. There is no hydronephrosis or nephrolithiasis on either side. Subcentimeter faintly visualized hypodense lesion in the inferior pole of the left kidney is not characterized on this CT but was present on the prior CT and likely represents a cyst. Ultrasound may provide better evaluation. The visualized ureters and urinary bladder appear unremarkable. Stomach/Bowel: There is no bowel obstruction or active inflammation. The appendix is normal. Vascular/Lymphatic: Mild aortoiliac atherosclerotic disease. The IVC is unremarkable. No portal venous gas. There is no adenopathy. Reproductive: The prostate and seminal vesicles are grossly unremarkable. No pelvic mass. 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 There is disc desiccation and diffuse disc bulge at L5-S1. No acute osseous pathology. IMPRESSION: 1. No acute intrathoracic, abdominal, or pelvic pathology. 2. Mild Aortic Atherosclerosis (ICD10-I70.0). Electronically Signed   By: Elgie Collard M.D.   On: 04/02/2019 15:29   CT Chest Wo Contrast  Result Date: 04/02/2019 CLINICAL DATA:  54 year old male with abdominal distention, nausea vomiting. EXAM: CT CHEST, ABDOMEN AND PELVIS WITHOUT CONTRAST TECHNIQUE: Multidetector CT imaging of the chest, abdomen and pelvis was performed following the standard protocol without IV contrast. COMPARISON:  Chest radiograph dated 04/02/2019 and CT abdomen pelvis dated 03/23/2018. FINDINGS: Evaluation of this exam is limited in the absence of intravenous contrast. CT CHEST  FINDINGS Cardiovascular: There is no cardiomegaly or pericardial effusion. There is coronary vascular calcification of the LAD. The thoracic aorta and central pulmonary arteries are grossly unremarkable on this noncontrast CT. Mediastinum/Nodes: There is no hilar or mediastinal adenopathy. The esophagus is grossly unremarkable. No mediastinal fluid collection. Lungs/Pleura: The lungs are clear. There is no pleural effusion or pneumothorax. The central airways are patent. Musculoskeletal: No chest wall mass or suspicious bone lesions identified. CT ABDOMEN PELVIS FINDINGS No intra-abdominal free air or free fluid. Hepatobiliary: No focal liver abnormality is seen. No gallstones, gallbladder wall thickening, or biliary dilatation. Pancreas: Unremarkable. No pancreatic ductal dilatation or surrounding inflammatory changes. Spleen: Normal in size without focal abnormality. Adrenals/Urinary Tract: The adrenal glands are unremarkable. There is no hydronephrosis or nephrolithiasis on either side. Subcentimeter faintly visualized hypodense lesion in the inferior pole of the left kidney is not characterized on this CT but was present on the prior CT and likely represents a cyst. Ultrasound may provide better evaluation. The visualized ureters and urinary bladder appear unremarkable. Stomach/Bowel: There is no bowel obstruction or active inflammation. The appendix is normal. Vascular/Lymphatic: Mild aortoiliac atherosclerotic  disease. The IVC is unremarkable. No portal venous gas. There is no adenopathy. Reproductive: The prostate and seminal vesicles are grossly unremarkable. No pelvic mass. 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 There is disc desiccation and diffuse disc bulge at L5-S1. No acute osseous pathology. IMPRESSION: 1. No acute intrathoracic, abdominal, or pelvic pathology. 2. Mild Aortic Atherosclerosis (ICD10-I70.0). Electronically Signed   By: Anner Crete  M.D.   On: 04/02/2019 15:29   DG Chest Portable 1 View  Result Date: 04/02/2019 CLINICAL DATA:  Nausea and vomiting for the last 3 days. EXAM: PORTABLE CHEST 1 VIEW COMPARISON:  None. FINDINGS: The cardiac silhouette, mediastinal and hilar contours are normal. The lungs are clear. No pleural effusion. The bony thorax is intact. IMPRESSION: No acute cardiopulmonary findings. Electronically Signed   By: Marijo Sanes M.D.   On: 04/02/2019 15:04    Scheduled Meds: . amLODipine  10 mg Oral Daily  . insulin aspart  0-5 Units Subcutaneous QHS  . insulin aspart  0-9 Units Subcutaneous TID WC   Continuous Infusions: . pantoprozole (PROTONIX) infusion 8 mg/hr (04/03/19 0822)  .  sodium bicarbonate  infusion 1000 mL 100 mL/hr at 04/03/19 8315    Assessment/Plan:  1. Intractable nausea vomiting and coffee-ground emesis.  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  Patient on Protonix drip.  Advance to clear liquid diet and conservative management at this point.  Patient advised not to smoke any marijuana. 2. Acute kidney injury on chronic kidney disease stage II.  Creatinine much improved today from 6.33 down to 2.42.  Check creatinine again tomorrow morning. 3. Essential hypertension on Norvasc 4. Leukocytosis could be reactive. 5. Elevated total protein send off a serum protein electrophoresis 6. Advised patient to get rid of vices including alcohol, marijuana and smoking.  Code Status:     Code Status Orders  (From admission, onward)         Start     Ordered   04/02/19 2038  Full code  Continuous     04/02/19 2037        Code Status History    Date Active Date Inactive Code Status Order ID Comments User Context   01/02/2014 1606 01/03/2014 1939 Full Code [id number]  Corky Sox, MD Inpatient   Advance Care Planning Activity     Disposition Plan: Patient will need to be advanced on his diet and tolerates solid food prior to any disposition.  Would  like kidney function to get a little bit better prior to disposition  Consultants:  Gastroenterology  Time spent: 28 minutes  [place]

## 2019-04-03 NOTE — Consult Note (Signed)
Devin Lame, MD [facility]  [address], [place], [place] [address] Phone: [phone] Fax : [phone]  Consultation  Referring Provider:     Dr. Posey Pronto Primary Care Physician:  Patient, No Pcp Per Primary Gastroenterologist: Althia Forts         Reason for Consultation:     Nausea and vomiting  Date of Admission:  04/02/2019 Date of Consultation:  04/03/2019         HPI:   Devin Chan is a 54 y.o. male who had seen me in 2017 for right side abdominal pain and had seen Dr. Deatra Ina in 2015 for nausea and vomiting.  The patient states that he was in [place] at the hospital for similar symptoms a year ago and reports that he was diagnosed with a stomach ulcer and started on pantoprazole.  Last year the patient's hospital admission was in February also and at that time he had a hemoglobin of 17.1 with a white blood cell count of 16.9. The patient reports that he was in his usual state of health without any problems 4 days ago and then 3 days ago he had some ham with some [place] dressing he put on the hand.  He reports in retrospect he thinks that the [place] dressing looked funny and may have been a different color than he was used to.  He was unable to eat or drink for 3 days and states that he became more contracted and his friend came to see him and brought him to the hospital.  He did report some coffee-ground emesis.  The patient's white cell count was significantly elevated at 36.6 with hemoconcentration showing a hemoglobin of 18.9.  This morning the white cell count has come down to 24.3 with his hemoglobin of 15.4.  The patient feels like he is doing much better today than he had in the past and is not having any nausea at the present time.  Patient had a CT scan of the abdomen pelvis that did not show any acute abnormalities to explain his symptoms.   Past Medical History:  Diagnosis Date  . Arthritis   . Asthma   . Depression     Past Surgical History:  Procedure  Laterality Date  . CLEFT PALATE REPAIR  age 89   [facility]  . NO PAST SURGERIES      Prior to Admission medications   Medication Sig Start Date End Date Taking? Authorizing Provider  pantoprazole (PROTONIX) 40 MG tablet Take 40 mg by mouth every morning. 02/05/19  Yes [provider]  amLODipine (NORVASC) 10 MG tablet Take 1 tablet (10 mg total) by mouth daily. Patient not taking: Reported on 04/02/2019 03/25/18   Dorie Rank, MD    Family History  Problem Relation Age of Onset  . Drug abuse Father   . Stroke Father   . Hypertension Father      Social History   Tobacco Use  . Smoking status: Current Every Day Smoker    Packs/day: 1.00    Years: 30.00    Pack years: 30.00    Types: Cigarettes    Last attempt to quit: 05/01/2011    Years since quitting: 7.9  . Smokeless tobacco: Never Used  . Tobacco comment: quit x 6 months at age 69  Substance Use Topics  . Alcohol use: Yes    Alcohol/week: 27.0 standard drinks    Types: 15 Cans of beer, 12 Standard drinks or equivalent per week  . Drug  use: Yes    Types: Marijuana    Allergies as of 04/02/2019  . (No Known Allergies)    Review of Systems:    All systems reviewed and negative except where noted in HPI.   Physical Exam:  Vital signs in last 24 hours: Temp:  [98.6 F (37 C)-99.8 F (37.7 C)] 98.6 F (37 C) (02/20 1140) Pulse Rate:  [89-111] 89 (02/20 1140) Resp:  [14-27] 16 (02/20 1140) BP: (120-203)/(75-119) 141/94 (02/20 1140) SpO2:  [94 %-99 %] 99 % (02/20 1140)   General:   Pleasant, cooperative in NAD Head:  Normocephalic and atraumatic. Eyes:   No icterus.   Conjunctiva pink. PERRLA. Ears:  Normal auditory acuity. Neck:  Supple; no masses or thyroidomegaly Lungs: Respirations even and unlabored. Lungs clear to auscultation bilaterally.   No wheezes, crackles, or rhonchi.  Heart:  Regular rate and rhythm;  Without murmur, clicks, rubs or gallops Abdomen:  Soft, nondistended, nontender. Normal bowel  sounds. No appreciable masses or hepatomegaly.  No rebound or guarding.  Rectal:  Not performed. Msk:  Symmetrical without gross deformities.    Extremities:  Without edema, cyanosis or clubbing. Neurologic:  Alert and oriented x3;  grossly normal neurologically. Skin:  Intact without significant lesions or rashes. Cervical Nodes:  No significant cervical adenopathy. Psych:  Alert and cooperative. Normal affect.  LAB RESULTS: Recent Labs    04/02/19 1215 04/03/19 0455  WBC 36.6* 25.3*  HGB 18.9* 15.4  HCT 55.1* 44.3  PLT 498* 345   BMET Recent Labs    04/02/19 1215 04/02/19 1727 04/03/19 0455  NA 132* 136 137  K 3.8 4.2 4.1  CL 94* 102 105  CO2 14* 21* 22  GLUCOSE 189* 131* 111*  BUN 44* 49* 39*  CREATININE 6.33* 5.16* 2.42*  CALCIUM 11.0* 8.8* 8.7*   LFT Recent Labs    04/02/19 1215  PROT 10.4*  ALBUMIN 5.8*  AST 51*  ALT 22  ALKPHOS 67  BILITOT 1.5*   PT/INR No results for input(s): LABPROT, INR in the last 72 hours.  STUDIES: CT ABDOMEN PELVIS WO CONTRAST  Result Date: 04/02/2019 CLINICAL DATA:  54 year old male with abdominal distention, nausea vomiting. EXAM: CT CHEST, ABDOMEN AND PELVIS WITHOUT CONTRAST TECHNIQUE: Multidetector CT imaging of the chest, abdomen and pelvis was performed following the standard protocol without IV contrast. COMPARISON:  Chest radiograph dated 04/02/2019 and CT abdomen pelvis dated 03/23/2018. FINDINGS: Evaluation of this exam is limited in the absence of intravenous contrast. CT CHEST FINDINGS Cardiovascular: There is no cardiomegaly or pericardial effusion. There is coronary vascular calcification of the LAD. The thoracic aorta and central pulmonary arteries are grossly unremarkable on this noncontrast CT. Mediastinum/Nodes: There is no hilar or mediastinal adenopathy. The esophagus is grossly unremarkable. No mediastinal fluid collection. Lungs/Pleura: The lungs are clear. There is no pleural effusion or pneumothorax. The  central airways are patent. Musculoskeletal: No chest wall mass or suspicious bone lesions identified. CT ABDOMEN PELVIS FINDINGS No intra-abdominal free air or free fluid. Hepatobiliary: No focal liver abnormality is seen. No gallstones, gallbladder wall thickening, or biliary dilatation. Pancreas: Unremarkable. No pancreatic ductal dilatation or surrounding inflammatory changes. Spleen: Normal in size without focal abnormality. Adrenals/Urinary Tract: The adrenal glands are unremarkable. There is no hydronephrosis or nephrolithiasis on either side. Subcentimeter faintly visualized hypodense lesion in the inferior pole of the left kidney is not characterized on this CT but was present on the prior CT and likely represents a cyst. Ultrasound may provide better evaluation.  The visualized ureters and urinary bladder appear unremarkable. Stomach/Bowel: There is no bowel obstruction or active inflammation. The appendix is normal. Vascular/Lymphatic: Mild aortoiliac atherosclerotic disease. The IVC is unremarkable. No portal venous gas. There is no adenopathy. Reproductive: The prostate and seminal vesicles are grossly unremarkable. No pelvic mass. 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 There is disc desiccation and diffuse disc bulge at L5-S1. No acute osseous pathology. IMPRESSION: 1. No acute intrathoracic, abdominal, or pelvic pathology. 2. Mild Aortic Atherosclerosis (ICD10-I70.0). Electronically Signed   By: Elgie Collard M.D.   On: 04/02/2019 15:29   CT Chest Wo Contrast  Result Date: 04/02/2019 CLINICAL DATA:  54 year old male with abdominal distention, nausea vomiting. EXAM: CT CHEST, ABDOMEN AND PELVIS WITHOUT CONTRAST TECHNIQUE: Multidetector CT imaging of the chest, abdomen and pelvis was performed following the standard protocol without IV contrast. COMPARISON:  Chest radiograph dated 04/02/2019 and CT abdomen pelvis dated 03/23/2018. FINDINGS:  Evaluation of this exam is limited in the absence of intravenous contrast. CT CHEST FINDINGS Cardiovascular: There is no cardiomegaly or pericardial effusion. There is coronary vascular calcification of the LAD. The thoracic aorta and central pulmonary arteries are grossly unremarkable on this noncontrast CT. Mediastinum/Nodes: There is no hilar or mediastinal adenopathy. The esophagus is grossly unremarkable. No mediastinal fluid collection. Lungs/Pleura: The lungs are clear. There is no pleural effusion or pneumothorax. The central airways are patent. Musculoskeletal: No chest wall mass or suspicious bone lesions identified. CT ABDOMEN PELVIS FINDINGS No intra-abdominal free air or free fluid. Hepatobiliary: No focal liver abnormality is seen. No gallstones, gallbladder wall thickening, or biliary dilatation. Pancreas: Unremarkable. No pancreatic ductal dilatation or surrounding inflammatory changes. Spleen: Normal in size without focal abnormality. Adrenals/Urinary Tract: The adrenal glands are unremarkable. There is no hydronephrosis or nephrolithiasis on either side. Subcentimeter faintly visualized hypodense lesion in the inferior pole of the left kidney is not characterized on this CT but was present on the prior CT and likely represents a cyst. Ultrasound may provide better evaluation. The visualized ureters and urinary bladder appear unremarkable. Stomach/Bowel: There is no bowel obstruction or active inflammation. The appendix is normal. Vascular/Lymphatic: Mild aortoiliac atherosclerotic disease. The IVC is unremarkable. No portal venous gas. There is no adenopathy. Reproductive: The prostate and seminal vesicles are grossly unremarkable. No pelvic mass. 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 There is disc desiccation and diffuse disc bulge at L5-S1. No acute osseous pathology. IMPRESSION: 1. No acute intrathoracic, abdominal, or pelvic pathology. 2.  Mild Aortic Atherosclerosis (ICD10-I70.0). Electronically Signed   By: Elgie Collard M.D.   On: 04/02/2019 15:29   DG Chest Portable 1 View  Result Date: 04/02/2019 CLINICAL DATA:  Nausea and vomiting for the last 3 days. EXAM: PORTABLE CHEST 1 VIEW COMPARISON:  None. FINDINGS: The cardiac silhouette, mediastinal and hilar contours are normal. The lungs are clear. No pleural effusion. The bony thorax is intact. IMPRESSION: No acute cardiopulmonary findings. Electronically Signed   By: Rudie Meyer M.D.   On: 04/02/2019 15:04      Impression / Plan:   Assessment: Active Problems:   Intractable vomiting with nausea   Sayer Masini is a 54 y.o. y/o male with leukocytosis with nausea and vomiting and hematemesis.  The patient's hemoglobin is stable even after hydration.  It remains in the normal range.  The patient is feeling much better and may have had an episode of food poisoning although he denies any diarrhea  or abdominal pain at this time.  Plan: The patient will be started on a clear liquid diet to see if he tolerates this the patient has been on pantoprazole so repeat peptic ulcer disease is unlikely while on pantoprazole.  The patient has been told that if he tolerates the clear liquid diet we can advance his diet otherwise he will be set up for a upper endoscopy if he is unable to tolerate his diet.  The patient has been explained the plan agrees with it.  Thank you for involving me in the care of this patient.      LOS: 1 day   Midge Minium, MD  04/03/2019, 1:47 PM Pager [phone] 7am-5pm  Check AMION for 5pm -7am coverage and on weekends   Note: This dictation was prepared with Dragon dictation along with smaller phrase technology. Any transcriptional errors that result from this process are unintentional.

## 2019-04-04 DIAGNOSIS — R7989 Other specified abnormal findings of blood chemistry: Secondary | ICD-10-CM

## 2019-04-04 DIAGNOSIS — R1110 Vomiting, unspecified: Secondary | ICD-10-CM

## 2019-04-04 LAB — CBC
HCT: 42.2 % (ref 39.0–52.0)
Hemoglobin: 14.2 g/dL (ref 13.0–17.0)
MCH: 30.5 pg (ref 26.0–34.0)
MCHC: 33.6 g/dL (ref 30.0–36.0)
MCV: 90.8 fL (ref 80.0–100.0)
Platelets: 323 10*3/uL (ref 150–400)
RBC: 4.65 MIL/uL (ref 4.22–5.81)
RDW: 13.8 % (ref 11.5–15.5)
WBC: 16.9 10*3/uL — ABNORMAL HIGH (ref 4.0–10.5)
nRBC: 0 % (ref 0.0–0.2)

## 2019-04-04 LAB — BASIC METABOLIC PANEL
Anion gap: 8 (ref 5–15)
BUN: 23 mg/dL — ABNORMAL HIGH (ref 6–20)
CO2: 24 mmol/L (ref 22–32)
Calcium: 8.8 mg/dL — ABNORMAL LOW (ref 8.9–10.3)
Chloride: 105 mmol/L (ref 98–111)
Creatinine, Ser: 1.33 mg/dL — ABNORMAL HIGH (ref 0.61–1.24)
GFR calc Af Amer: >60 mL/min (ref >60)
GFR calc non Af Amer: >60 mL/min (ref >60)
Glucose, Bld: 115 mg/dL — ABNORMAL HIGH (ref 70–99)
Potassium: 4 mmol/L (ref 3.5–5.1)
Sodium: 137 mmol/L (ref 135–145)

## 2019-04-04 LAB — GLUCOSE, CAPILLARY
Glucose-Capillary: 84 mg/dL (ref 70–99)
Glucose-Capillary: 102 mg/dL — ABNORMAL HIGH (ref 70–99)
Glucose-Capillary: 102 mg/dL — ABNORMAL HIGH (ref 70–99)
Glucose-Capillary: 86 mg/dL (ref 70–99)

## 2019-04-04 LAB — HEPATIC FUNCTION PANEL
ALT: 48 U/L — ABNORMAL HIGH (ref 0–44)
AST: 111 U/L — ABNORMAL HIGH (ref 15–41)
Albumin: 3.5 g/dL (ref 3.5–5.0)
Alkaline Phosphatase: 40 U/L (ref 38–126)
Bilirubin, Direct: 0.2 mg/dL (ref 0.0–0.2)
Indirect Bilirubin: 1.3 mg/dL — ABNORMAL HIGH (ref 0.3–0.9)
Total Bilirubin: 1.5 mg/dL — ABNORMAL HIGH (ref 0.3–1.2)
Total Protein: 6.3 g/dL — ABNORMAL LOW (ref 6.5–8.1)

## 2019-04-04 MED ORDER — MORPHINE SULFATE (PF) 2 MG/ML IV SOLN
2.0000 mg | INTRAVENOUS | Status: DC | PRN
  Administered 2019-04-04: 2 mg via INTRAVENOUS
  Filled 2019-04-04: qty 1

## 2019-04-04 MED ORDER — ACETAMINOPHEN 325 MG PO TABS: 650.0000 mg | ORAL_TABLET | Freq: Four times a day (QID) | ORAL | Status: DC | PRN

## 2019-04-04 MED ORDER — OXYCODONE HCL 5 MG PO TABS
5.0000 mg | ORAL_TABLET | ORAL | Status: DC | PRN
  Administered 2019-04-04: 5 mg via ORAL
  Filled 2019-04-04: qty 1

## 2019-04-04 NOTE — Progress Notes (Signed)
[name], MD [facility]   [address], [place], [place] [address] Phone: [phone] Fax : [phone]   Subjective: The patient had some nausea with his clear liquid diet this morning and then had some potato soup and states that he feels poorly now because he continues to have nausea and vomiting.  He denies any abdominal pain at present time.   Objective: Vital signs in last 24 hours: Vitals:   04/03/19 1140 04/03/19 1955 04/04/19 0410 04/04/19 1210  BP: (!) 141/94 111/81 122/74 (!) 189/94  Pulse: 89 86 73 63  Resp: 16 18 17 20   Temp: 98.6 F (37 C) 98.5 F (36.9 C) 97.6 F (36.4 C) (!) 97.5 F (36.4 C)  TempSrc: Oral Oral Oral Oral  SpO2: 99% 99% 98% 100%  Weight:      Height:       Weight change:   Intake/Output Summary (Last 24 hours) at 04/04/2019 1232 Last data filed at 04/03/2019 1805 Gross per 24 hour  Intake 1300 ml  Output --  Net 1300 ml     Exam: Heart:: Regular rate and rhythm, S1S2 present or without murmur or extra heart sounds Lungs: normal and clear to auscultation and percussion Abdomen: soft, nontender, normal bowel sounds   Lab Results: @LABTEST2 @ Micro Results: Recent Results (from the past 240 hour(s))  Blood culture (routine x 2)     Status: None (Preliminary result)   Collection Time: 04/02/19  2:23 PM   Specimen: BLOOD  Result Value Ref Range Status   Specimen Description BLOOD BLOOD RIGHT HAND  Final   Special Requests   Final    BOTTLES DRAWN AEROBIC AND ANAEROBIC Blood Culture results may not be optimal due to an inadequate volume of blood received in culture bottles   Culture   Final    NO GROWTH 2 DAYS Performed at [facility], [address] [place], [place] [address]    Report Status PENDING  Incomplete  Blood culture (routine x 2)     Status: None (Preliminary result)   Collection Time: 04/02/19  2:24 PM   Specimen: BLOOD  Result Value Ref Range Status   Specimen Description BLOOD BLOOD LEFT ARM   Final   Special Requests   Final    BOTTLES DRAWN AEROBIC AND ANAEROBIC Blood Culture results may not be optimal due to an inadequate volume of blood received in culture bottles   Culture   Final    NO GROWTH 2 DAYS Performed at [facility], [address] [place], [place] [address]    Report Status PENDING  Incomplete  Respiratory Panel by RT PCR (Flu A&B, Covid) - Nasopharyngeal Swab     Status: None   Collection Time: 04/02/19  3:14 PM   Specimen: Nasopharyngeal Swab  Result Value Ref Range Status   SARS Coronavirus 2 by RT PCR NEGATIVE NEGATIVE Final    Comment: (NOTE) SARS-CoV-2 target nucleic acids are NOT DETECTED. The SARS-CoV-2 RNA is generally detectable in upper respiratoy specimens during the acute phase of infection. The lowest concentration of SARS-CoV-2 viral copies this assay can detect is 131 copies/mL. A negative result does not preclude SARS-Cov-2 infection and should not be used as the sole basis for treatment or other patient management decisions. A negative result may occur with  improper specimen collection/handling, submission of specimen other than nasopharyngeal swab, presence of viral mutation(s) within the areas targeted by this assay, and inadequate number of viral copies (<131 copies/mL). A negative result must  be combined with clinical observations, patient history, and epidemiological information. The expected result is Negative. Fact Sheet for Patients:  https://www.moore.com/ Fact Sheet for Healthcare Providers:  https://www.young.biz/ This test is not yet ap proved or cleared by the [place] FDA and  has been authorized for detection and/or diagnosis of SARS-CoV-2 by FDA under an Emergency Use Authorization (EUA). This EUA will remain  in effect (meaning this test can be used) for the duration of the COVID-19 declaration under Section 564(b)(1) of the Act, 21 U.S.C. section 360bbb-3(b)(1),  unless the authorization is terminated or revoked sooner.    Influenza A by PCR NEGATIVE NEGATIVE Final   Influenza B by PCR NEGATIVE NEGATIVE Final    Comment: (NOTE) The Xpert Xpress SARS-CoV-2/FLU/RSV assay is intended as an aid in  the diagnosis of influenza from Nasopharyngeal swab specimens and  should not be used as a sole basis for treatment. Nasal washings and  aspirates are unacceptable for Xpert Xpress SARS-CoV-2/FLU/RSV  testing. Fact Sheet for Patients: https://www.moore.com/ Fact Sheet for Healthcare Providers: https://www.young.biz/ This test is not yet approved or cleared by the [place] FDA and  has been authorized for detection and/or diagnosis of SARS-CoV-2 by  FDA under an Emergency Use Authorization (EUA). This EUA will remain  in effect (meaning this test can be used) for the duration of the  Covid-19 declaration under Section 564(b)(1) of the Act, 21  U.S.C. section 360bbb-3(b)(1), unless the authorization is  terminated or revoked. Performed at [facility], [address] [place], [place] [address]    Studies/Results: CT ABDOMEN PELVIS WO CONTRAST  Result Date: 04/02/2019 CLINICAL DATA:  54 year old male with abdominal distention, nausea vomiting. EXAM: CT CHEST, ABDOMEN AND PELVIS WITHOUT CONTRAST TECHNIQUE: Multidetector CT imaging of the chest, abdomen and pelvis was performed following the standard protocol without IV contrast. COMPARISON:  Chest radiograph dated 04/02/2019 and CT abdomen pelvis dated 03/23/2018. FINDINGS: Evaluation of this exam is limited in the absence of intravenous contrast. CT CHEST FINDINGS Cardiovascular: There is no cardiomegaly or pericardial effusion. There is coronary vascular calcification of the LAD. The thoracic aorta and central pulmonary arteries are grossly unremarkable on this noncontrast CT. Mediastinum/Nodes: There is no hilar or mediastinal adenopathy. The esophagus  is grossly unremarkable. No mediastinal fluid collection. Lungs/Pleura: The lungs are clear. There is no pleural effusion or pneumothorax. The central airways are patent. Musculoskeletal: No chest wall mass or suspicious bone lesions identified. CT ABDOMEN PELVIS FINDINGS No intra-abdominal free air or free fluid. Hepatobiliary: No focal liver abnormality is seen. No gallstones, gallbladder wall thickening, or biliary dilatation. Pancreas: Unremarkable. No pancreatic ductal dilatation or surrounding inflammatory changes. Spleen: Normal in size without focal abnormality. Adrenals/Urinary Tract: The adrenal glands are unremarkable. There is no hydronephrosis or nephrolithiasis on either side. Subcentimeter faintly visualized hypodense lesion in the inferior pole of the left kidney is not characterized on this CT but was present on the prior CT and likely represents a cyst. Ultrasound may provide better evaluation. The visualized ureters and urinary bladder appear unremarkable. Stomach/Bowel: There is no bowel obstruction or active inflammation. The appendix is normal. Vascular/Lymphatic: Mild aortoiliac atherosclerotic disease. The IVC is unremarkable. No portal venous gas. There is no adenopathy. Reproductive: The prostate and seminal vesicles are grossly unremarkable. No pelvic mass. 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 There is disc desiccation and diffuse disc bulge at L5-S1. No acute osseous pathology. IMPRESSION: 1. No acute intrathoracic, abdominal, or pelvic pathology. 2. Mild  Aortic Atherosclerosis (ICD10-I70.0). Electronically Signed   By: Elgie Collard M.D.   On: 04/02/2019 15:29   CT Chest Wo Contrast  Result Date: 04/02/2019 CLINICAL DATA:  54 year old male with abdominal distention, nausea vomiting. EXAM: CT CHEST, ABDOMEN AND PELVIS WITHOUT CONTRAST TECHNIQUE: Multidetector CT imaging of the chest, abdomen and pelvis was performed following  the standard protocol without IV contrast. COMPARISON:  Chest radiograph dated 04/02/2019 and CT abdomen pelvis dated 03/23/2018. FINDINGS: Evaluation of this exam is limited in the absence of intravenous contrast. CT CHEST FINDINGS Cardiovascular: There is no cardiomegaly or pericardial effusion. There is coronary vascular calcification of the LAD. The thoracic aorta and central pulmonary arteries are grossly unremarkable on this noncontrast CT. Mediastinum/Nodes: There is no hilar or mediastinal adenopathy. The esophagus is grossly unremarkable. No mediastinal fluid collection. Lungs/Pleura: The lungs are clear. There is no pleural effusion or pneumothorax. The central airways are patent. Musculoskeletal: No chest wall mass or suspicious bone lesions identified. CT ABDOMEN PELVIS FINDINGS No intra-abdominal free air or free fluid. Hepatobiliary: No focal liver abnormality is seen. No gallstones, gallbladder wall thickening, or biliary dilatation. Pancreas: Unremarkable. No pancreatic ductal dilatation or surrounding inflammatory changes. Spleen: Normal in size without focal abnormality. Adrenals/Urinary Tract: The adrenal glands are unremarkable. There is no hydronephrosis or nephrolithiasis on either side. Subcentimeter faintly visualized hypodense lesion in the inferior pole of the left kidney is not characterized on this CT but was present on the prior CT and likely represents a cyst. Ultrasound may provide better evaluation. The visualized ureters and urinary bladder appear unremarkable. Stomach/Bowel: There is no bowel obstruction or active inflammation. The appendix is normal. Vascular/Lymphatic: Mild aortoiliac atherosclerotic disease. The IVC is unremarkable. No portal venous gas. There is no adenopathy. Reproductive: The prostate and seminal vesicles are grossly unremarkable. No pelvic mass. 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 There is disc  desiccation and diffuse disc bulge at L5-S1. No acute osseous pathology. IMPRESSION: 1. No acute intrathoracic, abdominal, or pelvic pathology. 2. Mild Aortic Atherosclerosis (ICD10-I70.0). Electronically Signed   By: Elgie Collard M.D.   On: 04/02/2019 15:29   DG Chest Portable 1 View  Result Date: 04/02/2019 CLINICAL DATA:  Nausea and vomiting for the last 3 days. EXAM: PORTABLE CHEST 1 VIEW COMPARISON:  None. FINDINGS: The cardiac silhouette, mediastinal and hilar contours are normal. The lungs are clear. No pleural effusion. The bony thorax is intact. IMPRESSION: No acute cardiopulmonary findings. Electronically Signed   By: Rudie Meyer M.D.   On: 04/02/2019 15:04   Medications: I have reviewed the patient's current medications. Scheduled Meds: . amLODipine  10 mg Oral Daily   Continuous Infusions: . pantoprozole (PROTONIX) infusion 8 mg/hr (04/04/19 0151)   PRN Meds:.acetaminophen, albuterol, hydrALAZINE, ondansetron (ZOFRAN) IV   Assessment: Active Problems:   Cannabinoid hyperemesis syndrome   Acute kidney injury superimposed on CKD (HCC)   Essential hypertension   Leukocytosis   Elevated total protein    Plan: This patient came in with nausea vomiting and despite trying to advance his diet he continues to have nausea and vomiting.  He has been suggested to have cannabinoid hyperemesis syndrome but he does have a history of peptic ulcer disease in the past.  The patient will be set up for an EGD for tomorrow to look for any other cause for his continued nausea vomiting.  The patient has been explained the plan and agrees with it.   LOS: 2 days   Lara Palinkas  Tajuanna Burnett 04/04/2019, 12:32 PM Pager [phone] 7am-5pm  Check AMION for 5pm -7am coverage and on weekends

## 2019-04-04 NOTE — Progress Notes (Signed)
Patient ID: Devin Chan, male   DOB: 01-06-1966, 54 y.o.   MRN: [id number] [facility] PROGRESS NOTE  [facility] GLO:[id number] DOB: 09/21/1965 DOA: 04/02/2019 PCP: Patient, No Pcp Per  HPI/Subjective: Patient with dry heaving and some nausea vomiting after full liquid diet this morning.  No abdominal pain.  Wants to take a hot shower.  No diarrhea.  Had a normal bowel movement that was brown this morning.  Objective: Vitals:   04/04/19 0410 04/04/19 1210  BP: 122/74 (!) 189/94  Pulse: 73 63  Resp: 17 20  Temp: 97.6 F (36.4 C) (!) 97.5 F (36.4 C)  SpO2: 98% 100%    Intake/Output Summary (Last 24 hours) at 04/04/2019 1240 Last data filed at 04/03/2019 1805 Gross per 24 hour  Intake 1300 ml  Output --  Net 1300 ml   Filed Weights   04/02/19 1207  Weight: 68 kg    ROS: Review of Systems  Constitutional: Negative for chills and fever.  Eyes: Negative for blurred vision.  Respiratory: Negative for cough and shortness of breath.   Cardiovascular: Negative for chest pain.  Gastrointestinal: Positive for nausea and vomiting. Negative for abdominal pain, constipation and diarrhea.  Genitourinary: Negative for dysuria.  Musculoskeletal: Negative for joint pain.  Neurological: Negative for dizziness and headaches.   Exam: Physical Exam  Constitutional: He is oriented to person, place, and time.  HENT:  Nose: No mucosal edema.  Mouth/Throat: No oropharyngeal exudate.  Eyes: Conjunctivae and lids are normal.  Neck: Carotid bruit is not present.  Cardiovascular: S1 normal and S2 normal. Exam reveals no gallop.  No murmur heard. Respiratory: No respiratory distress. He has no wheezes. He has no rhonchi. He has no rales.  GI: Soft. Bowel sounds are normal. There is no abdominal tenderness.  Musculoskeletal:     Right ankle: No swelling.     Left ankle: No swelling.  Lymphadenopathy:    He has no cervical adenopathy.  Neurological: He is alert and oriented  to person, place, and time. No cranial nerve deficit.  Skin: Skin is warm. No rash noted. Nails show no clubbing.  Psychiatric: He has a normal mood and affect.      Data Reviewed: Basic Metabolic Panel: Recent Labs  Lab 04/02/19 1215 04/02/19 1727 04/03/19 0455 04/04/19 0516  NA 132* 136 137 137  K 3.8 4.2 4.1 4.0  CL 94* 102 105 105  CO2 14* 21* 22 24  GLUCOSE 189* 131* 111* 115*  BUN 44* 49* 39* 23*  CREATININE 6.33* 5.16* 2.42* 1.33*  CALCIUM 11.0* 8.8* 8.7* 8.8*   Liver Function Tests: Recent Labs  Lab 04/02/19 1215 04/04/19 0516  AST 51* 111*  ALT 22 48*  ALKPHOS 67 40  BILITOT 1.5* 1.5*  PROT 10.4* 6.3*  ALBUMIN 5.8* 3.5   Recent Labs  Lab 04/02/19 1215  LIPASE 34   CBC: Recent Labs  Lab 04/02/19 1215 04/03/19 0455 04/04/19 0516  WBC 36.6* 25.3* 16.9*  HGB 18.9* 15.4 14.2  HCT 55.1* 44.3 42.2  MCV 88.0 87.7 90.8  PLT 498* 345 323    CBG: Recent Labs  Lab 04/03/19 1138 04/03/19 1627 04/03/19 2150 04/04/19 0746 04/04/19 1208  GLUCAP 102* 83 82 84 102*    Recent Results (from the past 240 hour(s))  Blood culture (routine x 2)     Status: None (Preliminary result)   Collection Time: 04/02/19  2:23 PM   Specimen: BLOOD  Result Value Ref Range Status   Specimen  Description BLOOD BLOOD RIGHT HAND  Final   Special Requests   Final    BOTTLES DRAWN AEROBIC AND ANAEROBIC Blood Culture results may not be optimal due to an inadequate volume of blood received in culture bottles   Culture   Final    NO GROWTH 2 DAYS Performed at [facility], [address], [place], [place] [address]    Report Status PENDING  Incomplete  Blood culture (routine x 2)     Status: None (Preliminary result)   Collection Time: 04/02/19  2:24 PM   Specimen: BLOOD  Result Value Ref Range Status   Specimen Description BLOOD BLOOD LEFT ARM  Final   Special Requests   Final    BOTTLES DRAWN AEROBIC AND ANAEROBIC Blood Culture results may not be optimal due  to an inadequate volume of blood received in culture bottles   Culture   Final    NO GROWTH 2 DAYS Performed at [facility], [address], [place], [place] [address]    Report Status PENDING  Incomplete  Respiratory Panel by RT PCR (Flu A&B, Covid) - Nasopharyngeal Swab     Status: None   Collection Time: 04/02/19  3:14 PM   Specimen: Nasopharyngeal Swab  Result Value Ref Range Status   SARS Coronavirus 2 by RT PCR NEGATIVE NEGATIVE Final    Comment: (NOTE) SARS-CoV-2 target nucleic acids are NOT DETECTED. The SARS-CoV-2 RNA is generally detectable in upper respiratoy specimens during the acute phase of infection. The lowest concentration of SARS-CoV-2 viral copies this assay can detect is 131 copies/mL. A negative result does not preclude SARS-Cov-2 infection and should not be used as the sole basis for treatment or other patient management decisions. A negative result may occur with  improper specimen collection/handling, submission of specimen other than nasopharyngeal swab, presence of viral mutation(s) within the areas targeted by this assay, and inadequate number of viral copies (<131 copies/mL). A negative result must be combined with clinical observations, patient history, and epidemiological information. The expected result is Negative. Fact Sheet for Patients:  https://www.moore.com/ Fact Sheet for Healthcare Providers:  https://www.young.biz/ This test is not yet ap proved or cleared by the [place] FDA and  has been authorized for detection and/or diagnosis of SARS-CoV-2 by FDA under an Emergency Use Authorization (EUA). This EUA will remain  in effect (meaning this test can be used) for the duration of the COVID-19 declaration under Section 564(b)(1) of the Act, 21 U.S.C. section 360bbb-3(b)(1), unless the authorization is terminated or revoked sooner.    Influenza A by PCR NEGATIVE NEGATIVE Final   Influenza B  by PCR NEGATIVE NEGATIVE Final    Comment: (NOTE) The Xpert Xpress SARS-CoV-2/FLU/RSV assay is intended as an aid in  the diagnosis of influenza from Nasopharyngeal swab specimens and  should not be used as a sole basis for treatment. Nasal washings and  aspirates are unacceptable for Xpert Xpress SARS-CoV-2/FLU/RSV  testing. Fact Sheet for Patients: https://www.moore.com/ Fact Sheet for Healthcare Providers: https://www.young.biz/ This test is not yet approved or cleared by the [place] FDA and  has been authorized for detection and/or diagnosis of SARS-CoV-2 by  FDA under an Emergency Use Authorization (EUA). This EUA will remain  in effect (meaning this test can be used) for the duration of the  Covid-19 declaration under Section 564(b)(1) of the Act, 21  U.S.C. section 360bbb-3(b)(1), unless the authorization is  terminated or revoked. Performed at [facility], [address] [place], [place]  [address]      Studies: CT ABDOMEN PELVIS WO CONTRAST  Result Date: 04/02/2019 CLINICAL DATA:  54 year old male with abdominal distention, nausea vomiting. EXAM: CT CHEST, ABDOMEN AND PELVIS WITHOUT CONTRAST TECHNIQUE: Multidetector CT imaging of the chest, abdomen and pelvis was performed following the standard protocol without IV contrast. COMPARISON:  Chest radiograph dated 04/02/2019 and CT abdomen pelvis dated 03/23/2018. FINDINGS: Evaluation of this exam is limited in the absence of intravenous contrast. CT CHEST FINDINGS Cardiovascular: There is no cardiomegaly or pericardial effusion. There is coronary vascular calcification of the LAD. The thoracic aorta and central pulmonary arteries are grossly unremarkable on this noncontrast CT. Mediastinum/Nodes: There is no hilar or mediastinal adenopathy. The esophagus is grossly unremarkable. No mediastinal fluid collection. Lungs/Pleura: The lungs are clear. There is no pleural effusion or  pneumothorax. The central airways are patent. Musculoskeletal: No chest wall mass or suspicious bone lesions identified. CT ABDOMEN PELVIS FINDINGS No intra-abdominal free air or free fluid. Hepatobiliary: No focal liver abnormality is seen. No gallstones, gallbladder wall thickening, or biliary dilatation. Pancreas: Unremarkable. No pancreatic ductal dilatation or surrounding inflammatory changes. Spleen: Normal in size without focal abnormality. Adrenals/Urinary Tract: The adrenal glands are unremarkable. There is no hydronephrosis or nephrolithiasis on either side. Subcentimeter faintly visualized hypodense lesion in the inferior pole of the left kidney is not characterized on this CT but was present on the prior CT and likely represents a cyst. Ultrasound may provide better evaluation. The visualized ureters and urinary bladder appear unremarkable. Stomach/Bowel: There is no bowel obstruction or active inflammation. The appendix is normal. Vascular/Lymphatic: Mild aortoiliac atherosclerotic disease. The IVC is unremarkable. No portal venous gas. There is no adenopathy. Reproductive: The prostate and seminal vesicles are grossly unremarkable. No pelvic mass. 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 There is disc desiccation and diffuse disc bulge at L5-S1. No acute osseous pathology. IMPRESSION: 1. No acute intrathoracic, abdominal, or pelvic pathology. 2. Mild Aortic Atherosclerosis (ICD10-I70.0). Electronically Signed   By: Elgie Collard M.D.   On: 04/02/2019 15:29   CT Chest Wo Contrast  Result Date: 04/02/2019 CLINICAL DATA:  54 year old male with abdominal distention, nausea vomiting. EXAM: CT CHEST, ABDOMEN AND PELVIS WITHOUT CONTRAST TECHNIQUE: Multidetector CT imaging of the chest, abdomen and pelvis was performed following the standard protocol without IV contrast. COMPARISON:  Chest radiograph dated 04/02/2019 and CT abdomen pelvis dated  03/23/2018. FINDINGS: Evaluation of this exam is limited in the absence of intravenous contrast. CT CHEST FINDINGS Cardiovascular: There is no cardiomegaly or pericardial effusion. There is coronary vascular calcification of the LAD. The thoracic aorta and central pulmonary arteries are grossly unremarkable on this noncontrast CT. Mediastinum/Nodes: There is no hilar or mediastinal adenopathy. The esophagus is grossly unremarkable. No mediastinal fluid collection. Lungs/Pleura: The lungs are clear. There is no pleural effusion or pneumothorax. The central airways are patent. Musculoskeletal: No chest wall mass or suspicious bone lesions identified. CT ABDOMEN PELVIS FINDINGS No intra-abdominal free air or free fluid. Hepatobiliary: No focal liver abnormality is seen. No gallstones, gallbladder wall thickening, or biliary dilatation. Pancreas: Unremarkable. No pancreatic ductal dilatation or surrounding inflammatory changes. Spleen: Normal in size without focal abnormality. Adrenals/Urinary Tract: The adrenal glands are unremarkable. There is no hydronephrosis or nephrolithiasis on either side. Subcentimeter faintly visualized hypodense lesion in the inferior pole of the left kidney is not characterized on this CT but was present on the prior CT and likely represents a cyst. Ultrasound may provide better evaluation.  The visualized ureters and urinary bladder appear unremarkable. Stomach/Bowel: There is no bowel obstruction or active inflammation. The appendix is normal. Vascular/Lymphatic: Mild aortoiliac atherosclerotic disease. The IVC is unremarkable. No portal venous gas. There is no adenopathy. Reproductive: The prostate and seminal vesicles are grossly unremarkable. No pelvic mass. 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 There is disc desiccation and diffuse disc bulge at L5-S1. No acute osseous pathology. IMPRESSION: 1. No acute intrathoracic, abdominal, or  pelvic pathology. 2. Mild Aortic Atherosclerosis (ICD10-I70.0). Electronically Signed   By: Anner Crete M.D.   On: 04/02/2019 15:29   DG Chest Portable 1 View  Result Date: 04/02/2019 CLINICAL DATA:  Nausea and vomiting for the last 3 days. EXAM: PORTABLE CHEST 1 VIEW COMPARISON:  None. FINDINGS: The cardiac silhouette, mediastinal and hilar contours are normal. The lungs are clear. No pleural effusion. The bony thorax is intact. IMPRESSION: No acute cardiopulmonary findings. Electronically Signed   By: Marijo Sanes M.D.   On: 04/02/2019 15:04    Scheduled Meds: . amLODipine  10 mg Oral Daily   Continuous Infusions: . pantoprozole (PROTONIX) infusion 8 mg/hr (04/04/19 0151)    Assessment/Plan:  1. Intractable nausea vomiting and coffee-ground emesis.  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  Patient on Protonix drip.  Patient advised not to smoke any marijuana.  Patient with nausea vomiting after full liquid diet this morning.  Gastroenterology to do an endoscopy tomorrow morning. 2. Acute kidney injury on chronic kidney disease stage II.  Creatinine much improved today from 6.33 down to 1.33. 3. Essential hypertension on Norvasc 4. Leukocytosis could be reactive.  Trending in the right direction. 5. Elevated liver function test and elevated total protein, serum protein electrophoresis pending 6. Advised patient to get rid of vices including alcohol, marijuana and smoking.  Code Status:     Code Status Orders  (From admission, onward)         Start     Ordered   04/02/19 2038  Full code  Continuous     04/02/19 2037        Code Status History    Date Active Date Inactive Code Status Order ID Comments User Context   01/02/2014 1606 01/03/2014 1939 Full Code [id number]  Corky Sox, MD Inpatient   Advance Care Planning Activity     Disposition Plan: Endoscopy tomorrow.  Will need to tolerate diet prior to any disposition.  Creatinine  much improved.  Consultants:  Gastroenterology  Time spent: 27 minutes, case discussed with gastroenterology.  [place]  [facility] [employer]

## 2019-04-05 ENCOUNTER — Encounter: Payer: Self-pay | Admitting: Internal Medicine

## 2019-04-05 ENCOUNTER — Inpatient Hospital Stay: Payer: Self-pay | Admitting: Anesthesiology

## 2019-04-05 ENCOUNTER — Encounter
Admission: EM | Disposition: A | Discharge status: Home / Self Care | Payer: Self-pay | Source: Home / Self Care | Attending: Internal Medicine

## 2019-04-05 DIAGNOSIS — R1010 Upper abdominal pain, unspecified: Secondary | ICD-10-CM

## 2019-04-05 DIAGNOSIS — R112 Nausea with vomiting, unspecified: Secondary | ICD-10-CM

## 2019-04-05 DIAGNOSIS — K3189 Other diseases of stomach and duodenum: Secondary | ICD-10-CM

## 2019-04-05 DIAGNOSIS — K228 Other specified diseases of esophagus: Secondary | ICD-10-CM

## 2019-04-05 DIAGNOSIS — K2900 Acute gastritis without bleeding: Secondary | ICD-10-CM

## 2019-04-05 DIAGNOSIS — K2270 Barrett's esophagus without dysplasia: Secondary | ICD-10-CM

## 2019-04-05 HISTORY — PX: ESOPHAGOGASTRODUODENOSCOPY (EGD) WITH PROPOFOL: SHX5813

## 2019-04-05 LAB — CBC
HCT: 44.6 % (ref 39.0–52.0)
Hemoglobin: 14.8 g/dL (ref 13.0–17.0)
MCH: 30.2 pg (ref 26.0–34.0)
MCHC: 33.2 g/dL (ref 30.0–36.0)
MCV: 91 fL (ref 80.0–100.0)
Platelets: 326 10*3/uL (ref 150–400)
RBC: 4.9 MIL/uL (ref 4.22–5.81)
RDW: 13.1 % (ref 11.5–15.5)
WBC: 16.1 10*3/uL — ABNORMAL HIGH (ref 4.0–10.5)
nRBC: 0 % (ref 0.0–0.2)

## 2019-04-05 LAB — BASIC METABOLIC PANEL
Anion gap: 10 (ref 5–15)
BUN: 20 mg/dL (ref 6–20)
CO2: 24 mmol/L (ref 22–32)
Calcium: 9.1 mg/dL (ref 8.9–10.3)
Chloride: 102 mmol/L (ref 98–111)
Creatinine, Ser: 1.18 mg/dL (ref 0.61–1.24)
GFR calc Af Amer: >60 mL/min (ref >60)
GFR calc non Af Amer: >60 mL/min (ref >60)
Glucose, Bld: 83 mg/dL (ref 70–99)
Potassium: 3.8 mmol/L (ref 3.5–5.1)
Sodium: 136 mmol/L (ref 135–145)

## 2019-04-05 LAB — PROTEIN ELECTROPHORESIS, SERUM
A/G Ratio: 1.3 (ref 0.7–1.7)
Albumin ELP: 3.4 g/dL (ref 2.9–4.4)
Alpha-1-Globulin: 0.2 g/dL (ref 0.0–0.4)
Alpha-2-Globulin: 0.6 g/dL (ref 0.4–1.0)
Beta Globulin: 0.9 g/dL (ref 0.7–1.3)
Gamma Globulin: 0.9 g/dL (ref 0.4–1.8)
Globulin, Total: 2.6 g/dL (ref 2.2–3.9)
Total Protein ELP: 6 g/dL (ref 6.0–8.5)

## 2019-04-05 LAB — GLUCOSE, CAPILLARY
Glucose-Capillary: 84 mg/dL (ref 70–99)
Glucose-Capillary: 83 mg/dL (ref 70–99)

## 2019-04-05 SURGERY — ESOPHAGOGASTRODUODENOSCOPY (EGD) WITH PROPOFOL
Anesthesia: General

## 2019-04-05 MED ORDER — MIDAZOLAM HCL 2 MG/2ML IJ SOLN
INTRAMUSCULAR | Status: AC
  Filled 2019-04-05: qty 2

## 2019-04-05 MED ORDER — AMLODIPINE BESYLATE 10 MG PO TABS: 10.0000 mg | ORAL_TABLET | Freq: Every day | ORAL | 0 refills | Status: AC

## 2019-04-05 MED ORDER — SODIUM CHLORIDE 0.9 % IV SOLN: INTRAVENOUS | Status: DC | PRN

## 2019-04-05 MED ORDER — LIDOCAINE HCL (PF) 2 % IJ SOLN
INTRAMUSCULAR | Status: DC | PRN
  Administered 2019-04-05: 60 mg

## 2019-04-05 MED ORDER — FENTANYL CITRATE (PF) 100 MCG/2ML IJ SOLN
INTRAMUSCULAR | Status: DC | PRN
  Administered 2019-04-05 (×2): 50 ug via INTRAVENOUS

## 2019-04-05 MED ORDER — PROPOFOL 500 MG/50ML IV EMUL
INTRAVENOUS | Status: DC | PRN
  Administered 2019-04-05: 50 ug/kg/min via INTRAVENOUS

## 2019-04-05 MED ORDER — PROPOFOL 10 MG/ML IV BOLUS
INTRAVENOUS | Status: AC
  Filled 2019-04-05: qty 20

## 2019-04-05 MED ORDER — LIDOCAINE HCL (PF) 2 % IJ SOLN
INTRAMUSCULAR | Status: AC
  Filled 2019-04-05: qty 5

## 2019-04-05 MED ORDER — ONDANSETRON HCL 4 MG PO TABS: 4.0000 mg | ORAL_TABLET | Freq: Three times a day (TID) | ORAL | 0 refills | Status: DC | PRN

## 2019-04-05 MED ORDER — PROPOFOL 10 MG/ML IV BOLUS
INTRAVENOUS | Status: DC | PRN
  Administered 2019-04-05: 30 mg via INTRAVENOUS
  Administered 2019-04-05: 20 mg via INTRAVENOUS

## 2019-04-05 MED ORDER — PANTOPRAZOLE SODIUM 40 MG PO TBEC: 40.0000 mg | DELAYED_RELEASE_TABLET | Freq: Every morning | ORAL | 0 refills | Status: DC

## 2019-04-05 MED ORDER — PANTOPRAZOLE SODIUM 40 MG PO TBEC
40.0000 mg | DELAYED_RELEASE_TABLET | Freq: Every day | ORAL | Status: DC
  Administered 2019-04-05: 40 mg via ORAL
  Filled 2019-04-05: qty 1

## 2019-04-05 MED ORDER — MIDAZOLAM HCL 5 MG/5ML IJ SOLN
INTRAMUSCULAR | Status: DC | PRN
  Administered 2019-04-05: 2 mg via INTRAVENOUS

## 2019-04-05 MED ORDER — FENTANYL CITRATE (PF) 100 MCG/2ML IJ SOLN
INTRAMUSCULAR | Status: AC
  Filled 2019-04-05: qty 2

## 2019-04-05 NOTE — Anesthesia Postprocedure Evaluation (Signed)
Anesthesia Post Note  Patient: Devin Chan  Procedure(s) Performed: ESOPHAGOGASTRODUODENOSCOPY (EGD) WITH PROPOFOL (N/A )  Patient location during evaluation: Endoscopy Anesthesia Type: General Level of consciousness: awake and alert Pain management: pain level controlled Vital Signs Assessment: post-procedure vital signs reviewed and stable Respiratory status: spontaneous breathing, nonlabored ventilation, respiratory function stable and patient connected to nasal cannula oxygen Cardiovascular status: blood pressure returned to baseline and stable Postop Assessment: no apparent nausea or vomiting Anesthetic complications: no     Last Vitals:  Vitals:   04/05/19 1358 04/05/19 1430  BP: 122/76 122/77  Pulse: 69 62  Resp: 15 20  Temp:  36.6 C  SpO2: 98% 100%    Last Pain:  Vitals:   04/05/19 1430  TempSrc: Oral  PainSc:                  Lenard Simmer

## 2019-04-05 NOTE — Anesthesia Preprocedure Evaluation (Addendum)
Anesthesia Evaluation  Patient identified by MRN, date of birth, ID band Patient awake    Reviewed: Allergy & Precautions, H&P , NPO status , Patient's Chart, lab work & pertinent test results, reviewed documented beta blocker date and time   History of Anesthesia Complications Negative for: history of anesthetic complications  Airway Mallampati: II  TM Distance: >3 FB Neck ROM: full    Dental  (+) Dental Advidsory Given, Poor Dentition, Missing Bridge:   Pulmonary neg shortness of breath, asthma , neg COPD, neg recent URI, Current Smoker,    Pulmonary exam normal        Cardiovascular Exercise Tolerance: Good hypertension, (-) angina+ CAD  (-) Past MI and (-) Cardiac Stents Normal cardiovascular exam+ dysrhythmias + pacemaker (-) Valvular Problems/Murmurs     Neuro/Psych PSYCHIATRIC DISORDERS Depression negative neurological ROS     GI/Hepatic Neg liver ROS, GERD  ,  Endo/Other  negative endocrine ROS  Renal/GU CRFRenal disease  negative genitourinary   Musculoskeletal   Abdominal   Peds  Hematology negative hematology ROS (+)   Anesthesia Other Findings Past Medical History: No date: Arthritis No date: Asthma No date: Depression   Reproductive/Obstetrics negative OB ROS                            Anesthesia Physical Anesthesia Plan  ASA: IV  Anesthesia Plan: General   Post-op Pain Management:    Induction: Intravenous  PONV Risk Score and Plan: 1 and Propofol infusion and TIVA  Airway Management Planned: Natural Airway and Nasal Cannula  Additional Equipment:   Intra-op Plan:   Post-operative Plan: Extubation in OR  Informed Consent: I have reviewed the patients History and Physical, chart, labs and discussed the procedure including the risks, benefits and alternatives for the proposed anesthesia with the patient or authorized representative who has indicated his/her  understanding and acceptance.     Dental Advisory Given  Plan Discussed with: Anesthesiologist, CRNA and Surgeon  Anesthesia Plan Comments:        Anesthesia Quick Evaluation

## 2019-04-05 NOTE — Transfer of Care (Signed)
Immediate Anesthesia Transfer of Care Note  Patient: Devin Chan  Procedure(s) Performed: ESOPHAGOGASTRODUODENOSCOPY (EGD) WITH PROPOFOL (N/A )  Patient Location: PACU  Anesthesia Type:General  Level of Consciousness: awake, alert  and oriented  Airway & Oxygen Therapy: Patient Spontanous Breathing and Patient connected to nasal cannula oxygen  Post-op Assessment: Report given to RN and Post -op Vital signs reviewed and stable  Post vital signs: Reviewed and stable  Last Vitals:  Vitals Value Taken Time  BP 102/76 04/05/19 1338  Temp    Pulse 81 04/05/19 1338  Resp 18 04/05/19 1338  SpO2 94 % 04/05/19 1338  Vitals shown include unvalidated device data.  Last Pain:  Vitals:   04/05/19 1338  TempSrc: (P) Temporal  PainSc:       Patients Stated Pain Goal: 0 (04/04/19 2000)  Complications: No apparent anesthesia complications

## 2019-04-05 NOTE — Discharge Summary (Signed)
[facility] NAME: Devin Chan    MR#:  [id number]  DATE OF BIRTH:  April 22, 1965  DATE OF ADMISSION:  04/02/2019 ADMITTING PHYSICIAN: Fritzi Mandes, MD  DATE OF DISCHARGE: 04/05/2019  3:37 PM  PRIMARY CARE PHYSICIAN: Patient, No Pcp Per    ADMISSION DIAGNOSIS:  Dehydration [E86.0] Upper GI bleed [K92.2] High anion gap metabolic acidosis [Q00.8] AKI (acute kidney injury) ([place]) [N17.9] Hematemesis with nausea [K92.0] Intractable vomiting with nausea [R11.2]  DISCHARGE DIAGNOSIS:  Active Problems:   Cannabinoid hyperemesis syndrome   Acute kidney injury superimposed on CKD (HCC)   Essential hypertension   Leukocytosis   Elevated total protein   Intractable vomiting   Elevated LFTs   SECONDARY DIAGNOSIS:   Past Medical History:  Diagnosis Date  . Arthritis   . Asthma   . Depression     HOSPITAL COURSE:   1.  Intractable nausea vomiting with coffee-ground emesis from cannabis hyperemesis syndrome.  The patient was on Protonix drip.  Endoscopy showing gastritis and a section of Barrett's esophagus.  Follow-up biopsy report.  Follow-up with GI as outpatient.  GI recommended Protonix 40 mg daily.  I advised him to stop smoking marijuana.  Patient ate a sandwich prior to discharge and was ready to go home. 2.  Acute kidney injury on chronic kidney disease stage II.  Creatinine was 6.33 on admission and improved to 1.18 upon discharge.  Patient was very dehydrated. 3.  Essential hypertension on Norvasc 4.  Leukocytosis likely reactive in nature.  Trending better. 5.  Elevated liver function test and elevated total protein.  Serum protein electrophoresis still pending. 6.  Patient was advised to get rid of his vices including alcohol, marijuana and smoking  DISCHARGE CONDITIONS:   Satisfactory  CONSULTS OBTAINED:  Treatment Team:  Jonathon Bellows, MD [name], MD  DRUG ALLERGIES:  No Known Allergies  DISCHARGE MEDICATIONS:    Allergies as of 04/05/2019   No Known Allergies     Medication List    TAKE these medications   amLODipine 10 MG tablet Commonly known as: NORVASC Take 1 tablet (10 mg total) by mouth daily.   ondansetron 4 MG tablet Commonly known as: Zofran Take 1 tablet (4 mg total) by mouth every 8 (eight) hours as needed for nausea or vomiting.   pantoprazole 40 MG tablet Commonly known as: PROTONIX Take 1 tablet (40 mg total) by mouth every morning.        DISCHARGE INSTRUCTIONS:   Follow-up PMD 1 week Follow-up gastroenterology 1 month  If you experience worsening of your admission symptoms, develop shortness of breath, life threatening emergency, suicidal or homicidal thoughts you must seek medical attention immediately by calling 911 or calling your MD immediately  if symptoms less severe.  You Must read complete instructions/literature along with all the possible adverse reactions/side effects for all the Medicines you take and that have been prescribed to you. Take any new Medicines after you have completely understood and accept all the possible adverse reactions/side effects.   Please note  You were cared for by a hospitalist during your hospital stay. If you have any questions about your discharge medications or the care you received while you were in the hospital after you are discharged, you can call the unit and asked to speak with the hospitalist on call if the hospitalist that took care of you is not available. Once you are discharged, your primary care physician will handle any further  medical issues. Please note that NO REFILLS for any discharge medications will be authorized once you are discharged, as it is imperative that you return to your primary care physician (or establish a relationship with a primary care physician if you do not have one) for your aftercare needs so that they can reassess your need for medications and monitor your lab values.    Today   CHIEF  COMPLAINT:   Chief Complaint  Patient presents with  . Emesis    HISTORY OF PRESENT ILLNESS:  Devin Chan  is a 54 y.o. male came in with persistent vomiting and hematemesis   VITAL SIGNS:  Blood pressure 122/77, pulse 62, temperature 97.8 F (36.6 C), temperature source Oral, resp. rate 20, height 5\' 6"  (1.676 m), weight 68 kg, SpO2 100 %.  I/O:    Intake/Output Summary (Last 24 hours) at 04/05/2019 1558 Last data filed at 04/05/2019 1523 Gross per 24 hour  Intake 1046.75 ml  Output 300 ml  Net 746.75 ml    PHYSICAL EXAMINATION:  GENERAL:  54 y.o.-year-old patient lying in the bed with no acute distress.  EYES: Pupils equal, round, reactive to light and accommodation. No scleral icterus. Extraocular muscles intact.  HEENT: Head atraumatic, normocephalic. Oropharynx and nasopharynx clear.  NECK:  Supple, no jugular venous distention. No thyroid enlargement, no tenderness.  LUNGS: Normal breath sounds bilaterally, no wheezing, rales,rhonchi or crepitation. No use of accessory muscles of respiration.  CARDIOVASCULAR: S1, S2 normal. No murmurs, rubs, or gallops.  ABDOMEN: Soft, non-tender, non-distended. Bowel sounds present. No organomegaly or mass.  EXTREMITIES: No pedal edema, cyanosis, or clubbing.  NEUROLOGIC: Cranial nerves II through XII are intact. Muscle strength 5/5 in all extremities. Sensation intact. Gait not checked.  PSYCHIATRIC: The patient is alert and oriented x 3.  SKIN: No obvious rash, lesion, or ulcer.   DATA REVIEW:   CBC Recent Labs  Lab 04/05/19 0417  WBC 16.1*  HGB 14.8  HCT 44.6  PLT 326    Chemistries  Recent Labs  Lab 04/04/19 0516 04/04/19 0516 04/05/19 0417  NA 137   < > 136  K 4.0   < > 3.8  CL 105   < > 102  CO2 24   < > 24  GLUCOSE 115*   < > 83  BUN 23*   < > 20  CREATININE 1.33*   < > 1.18  CALCIUM 8.8*   < > 9.1  AST 111*  --   --   ALT 48*  --   --   ALKPHOS 40  --   --   BILITOT 1.5*  --   --    < > = values in  this interval not displayed.     Microbiology Results  Results for orders placed or performed during the hospital encounter of 04/02/19  Blood culture (routine x 2)     Status: None (Preliminary result)   Collection Time: 04/02/19  2:23 PM   Specimen: BLOOD  Result Value Ref Range Status   Specimen Description BLOOD BLOOD RIGHT HAND  Final   Special Requests   Final    BOTTLES DRAWN AEROBIC AND ANAEROBIC Blood Culture results may not be optimal due to an inadequate volume of blood received in culture bottles   Culture   Final    NO GROWTH 3 DAYS Performed at [facility], [address], [place], [place]    Report Status PENDING  Incomplete  Blood culture (routine x  2)     Status: None (Preliminary result)   Collection Time: 04/02/19  2:24 PM   Specimen: BLOOD  Result Value Ref Range Status   Specimen Description BLOOD BLOOD LEFT ARM  Final   Special Requests   Final    BOTTLES DRAWN AEROBIC AND ANAEROBIC Blood Culture results may not be optimal due to an inadequate volume of blood received in culture bottles   Culture   Final    NO GROWTH 3 DAYS Performed at [facility], [address], [place], [place] [address]    Report Status PENDING  Incomplete  Respiratory Panel by RT PCR (Flu A&B, Covid) - Nasopharyngeal Swab     Status: None   Collection Time: 04/02/19  3:14 PM   Specimen: Nasopharyngeal Swab  Result Value Ref Range Status   SARS Coronavirus 2 by RT PCR NEGATIVE NEGATIVE Final    Comment: (NOTE) SARS-CoV-2 target nucleic acids are NOT DETECTED. The SARS-CoV-2 RNA is generally detectable in upper respiratoy specimens during the acute phase of infection. The lowest concentration of SARS-CoV-2 viral copies this assay can detect is 131 copies/mL. A negative result does not preclude SARS-Cov-2 infection and should not be used as the sole basis for treatment or other patient management decisions. A negative result may occur with  improper  specimen collection/handling, submission of specimen other than nasopharyngeal swab, presence of viral mutation(s) within the areas targeted by this assay, and inadequate number of viral copies (<131 copies/mL). A negative result must be combined with clinical observations, patient history, and epidemiological information. The expected result is Negative. Fact Sheet for Patients:  https://www.moore.com/ Fact Sheet for Healthcare Providers:  https://www.young.biz/ This test is not yet ap proved or cleared by the [place] FDA and  has been authorized for detection and/or diagnosis of SARS-CoV-2 by FDA under an Emergency Use Authorization (EUA). This EUA will remain  in effect (meaning this test can be used) for the duration of the COVID-19 declaration under Section 564(b)(1) of the Act, 21 U.S.C. section 360bbb-3(b)(1), unless the authorization is terminated or revoked sooner.    Influenza A by PCR NEGATIVE NEGATIVE Final   Influenza B by PCR NEGATIVE NEGATIVE Final    Comment: (NOTE) The Xpert Xpress SARS-CoV-2/FLU/RSV assay is intended as an aid in  the diagnosis of influenza from Nasopharyngeal swab specimens and  should not be used as a sole basis for treatment. Nasal washings and  aspirates are unacceptable for Xpert Xpress SARS-CoV-2/FLU/RSV  testing. Fact Sheet for Patients: https://www.moore.com/ Fact Sheet for Healthcare Providers: https://www.young.biz/ This test is not yet approved or cleared by the [place] FDA and  has been authorized for detection and/or diagnosis of SARS-CoV-2 by  FDA under an Emergency Use Authorization (EUA). This EUA will remain  in effect (meaning this test can be used) for the duration of the  Covid-19 declaration under Section 564(b)(1) of the Act, 21  U.S.C. section 360bbb-3(b)(1), unless the authorization is  terminated or revoked. Performed at [facility], [address] [place], [place] [address]      Management plans discussed with the patient, and he is in agreement.  CODE STATUS:     Code Status Orders  (From admission, onward)         Start     Ordered   04/02/19 2038  Full code  Continuous     04/02/19 2037        Code Status History    Date Active Date Inactive Code  Status Order ID Comments User Context   01/02/2014 1606 01/03/2014 1939 Full Code [id number]  Courtney Paris, MD Inpatient   Advance Care Planning Activity      TOTAL TIME TAKING CARE OF THIS PATIENT: 35 minutes.    Alford Highland M.D on 04/05/2019 at 3:58 PM  Between 7am to 6pm - Pager - [phone]  After 6pm go to [email or web address] - password [email or web address]  [facility]  CC: Primary care physician; Patient, No Pcp Per

## 2019-04-05 NOTE — Care Management (Signed)
Provided patient with goodrx coupons for medications at discharge

## 2019-04-05 NOTE — Op Note (Signed)
[facility] Gastroenterology Patient Name: Devin Chan Procedure Date: 04/05/2019 1:13 PM MRN: [id number] Account #: [id number] Date of Birth: [birth year] Admit Type: Inpatient Age: 54 Room: [facility] ENDO ROOM 1 Gender: Male Note Status: Finalized Procedure:             Upper GI endoscopy Indications:           Upper abdominal pain, Nausea with vomiting Providers:             Lin Landsman MD, MD Referring MD:          No Local Md, MD (Referring MD) Medicines:             Monitored Anesthesia Care Complications:         No immediate complications. Estimated blood loss: None. Procedure:             Pre-Anesthesia Assessment:                        - Prior to the procedure, a History and Physical was                         performed, and patient medications and allergies were                         reviewed. The patient is competent. The risks and                         benefits of the procedure and the sedation options and                         risks were discussed with the patient. All questions                         were answered and informed consent was obtained.                         Patient identification and proposed procedure were                         verified by the physician, the nurse, the                         anesthesiologist, the anesthetist and the technician                         in the pre-procedure area in the procedure room in the                         endoscopy suite. Mental Status Examination: alert and                         oriented. Airway Examination: normal oropharyngeal                         airway and neck mobility. Respiratory Examination:                         clear to auscultation. CV Examination: normal.  Prophylactic Antibiotics: The patient does not require                         prophylactic antibiotics. Prior Anticoagulants: The                         patient has taken no previous  anticoagulant or                         antiplatelet agents. ASA Grade Assessment: II - A                         patient with mild systemic disease. After reviewing                         the risks and benefits, the patient was deemed in                         satisfactory condition to undergo the procedure. The                         anesthesia plan was to use monitored anesthesia care                         (MAC). Immediately prior to administration of                         medications, the patient was re-assessed for adequacy                         to receive sedatives. The heart rate, respiratory                         rate, oxygen saturations, blood pressure, adequacy of                         pulmonary ventilation, and response to care were                         monitored throughout the procedure. The physical                         status of the patient was re-assessed after the                         procedure.                        After obtaining informed consent, the endoscope was                         passed under direct vision. Throughout the procedure,                         the patient's blood pressure, pulse, and oxygen                         saturations were monitored continuously. The Endoscope  was introduced through the mouth, and advanced to the                         second part of duodenum. The upper GI endoscopy was                         accomplished without difficulty. The patient tolerated                         the procedure fairly well. Findings:      The duodenal bulb and second portion of the duodenum were normal.      Localized moderately erythematous mucosa without bleeding was found in       the gastric fundus.      The gastric body, incisura and gastric antrum were normal. Biopsies were       taken with a cold forceps for Helicobacter pylori testing.      Esophagogastric landmarks were identified: the  gastroesophageal junction       was found at 35 cm from the incisors.      Two tongues of salmon-colored mucosa were present from 35 to 36 cm. No       other visible abnormalities were present. The maximum longitudinal       extent of these esophageal mucosal changes was 1 cm in length. Biopsies       were taken with a cold forceps for histology. Impression:            - Normal duodenal bulb and second portion of the                         duodenum.                        - Erythematous mucosa in the gastric fundus.                        - Normal gastric body, incisura and antrum. Biopsied.                        - Esophagogastric landmarks identified.                        - Salmon-colored mucosa suspicious for short-segment                         Barrett's esophagus. Biopsied. Recommendation:        - Await pathology results.                        - Return patient to hospital ward for ongoing care.                        - Await pathology results.                        - Advance diet as tolerated today.                        - Stop PPI drip                        -  Protonix 10m daily                        - Stop ETOH use                        - Return to GI clinic in 1 month. Procedure Code(s):     --- Professional ---                        [id number] Esophagogastroduodenoscopy, flexible,                         transoral; with biopsy, single or multiple Diagnosis Code(s):     --- Professional ---                        K22.8, Other specified diseases of esophagus                        K31.89, Other diseases of stomach and duodenum                        R10.10, Upper abdominal pain, unspecified                        R11.2, Nausea with vomiting, unspecified CPT copyright 2019 American Medical Association. All rights reserved. The codes documented in this report are preliminary and upon coder review may  be revised to meet current compliance requirements. Dr. RUlyess MortRLin LandsmanMD, MD 04/05/2019 1:37:41 PM This report has been signed electronically. Number of Addenda: 0 Note Initiated On: 04/05/2019 1:13 PM Estimated Blood Loss:  Estimated blood loss: none.      [facility]

## 2019-04-05 NOTE — Progress Notes (Signed)
Victoriano Lain Franzel  A and O x 4. VSS. Pt tolerating diet well. No complaints of pain or nausea. IV removed intact, prescriptions given. Pt voiced understanding of discharge instructions with no further questions. Pt discharged home.   Allergies as of 04/05/2019   No Known Allergies     Medication List    TAKE these medications   amLODipine 10 MG tablet Commonly known as: NORVASC Take 1 tablet (10 mg total) by mouth daily.   ondansetron 4 MG tablet Commonly known as: Zofran Take 1 tablet (4 mg total) by mouth every 8 (eight) hours as needed for nausea or vomiting.   pantoprazole 40 MG tablet Commonly known as: PROTONIX Take 1 tablet (40 mg total) by mouth every morning.       Vitals:   04/05/19 1358 04/05/19 1430  BP: 122/76 122/77  Pulse: 69 62  Resp: 15 20  Temp:  97.8 F (36.6 C)  SpO2: 98% 100%    Suzzanne Cloud

## 2019-04-05 NOTE — Discharge Instructions (Signed)

## 2019-04-06 ENCOUNTER — Encounter: Payer: Self-pay | Admitting: Gastroenterology

## 2019-04-06 ENCOUNTER — Encounter: Payer: Self-pay | Admitting: *Deleted

## 2019-04-06 LAB — HIV ANTIBODY (ROUTINE TESTING W REFLEX): HIV Screen 4th Generation wRfx: NONREACTIVE — AB

## 2019-04-06 LAB — SURGICAL PATHOLOGY

## 2019-04-07 LAB — CULTURE, BLOOD (ROUTINE X 2)
Culture: NO GROWTH
Culture: NO GROWTH

## 2019-04-09 ENCOUNTER — Encounter: Payer: Self-pay | Admitting: Emergency Medicine

## 2019-04-09 ENCOUNTER — Emergency Department
Admission: EM | Admit: 2019-04-09 | Discharge: 2019-04-10 | Disposition: A | Discharge status: Home / Self Care | Payer: Self-pay | Attending: Emergency Medicine | Admitting: Emergency Medicine

## 2019-04-09 ENCOUNTER — Other Ambulatory Visit: Payer: Self-pay

## 2019-04-09 DIAGNOSIS — J45909 Unspecified asthma, uncomplicated: Secondary | ICD-10-CM | POA: Insufficient documentation

## 2019-04-09 DIAGNOSIS — E860 Dehydration: Secondary | ICD-10-CM | POA: Insufficient documentation

## 2019-04-09 DIAGNOSIS — I10 Essential (primary) hypertension: Secondary | ICD-10-CM | POA: Insufficient documentation

## 2019-04-09 DIAGNOSIS — R112 Nausea with vomiting, unspecified: Secondary | ICD-10-CM | POA: Insufficient documentation

## 2019-04-09 DIAGNOSIS — F1210 Cannabis abuse, uncomplicated: Secondary | ICD-10-CM | POA: Insufficient documentation

## 2019-04-09 DIAGNOSIS — F17210 Nicotine dependence, cigarettes, uncomplicated: Secondary | ICD-10-CM | POA: Insufficient documentation

## 2019-04-09 LAB — CBC
HCT: 45 % (ref 39.0–52.0)
Hemoglobin: 15.4 g/dL (ref 13.0–17.0)
MCH: 30.7 pg (ref 26.0–34.0)
MCHC: 34.2 g/dL (ref 30.0–36.0)
MCV: 89.8 fL (ref 80.0–100.0)
Platelets: 421 10*3/uL — ABNORMAL HIGH (ref 150–400)
RBC: 5.01 MIL/uL (ref 4.22–5.81)
RDW: 13 % (ref 11.5–15.5)
WBC: 20.8 10*3/uL — ABNORMAL HIGH (ref 4.0–10.5)
nRBC: 0 % (ref 0.0–0.2)

## 2019-04-09 LAB — URINALYSIS, COMPLETE (UACMP) WITH MICROSCOPIC
Bilirubin Urine: NEGATIVE
Glucose, UA: NEGATIVE mg/dL
Hgb urine dipstick: NEGATIVE
Ketones, ur: NEGATIVE mg/dL
Leukocytes,Ua: NEGATIVE
Nitrite: NEGATIVE
Protein, ur: 30 mg/dL — AB
Specific Gravity, Urine: 1.02 (ref 1.005–1.030)
pH: 5 (ref 5.0–8.0)

## 2019-04-09 LAB — COMPREHENSIVE METABOLIC PANEL
ALT: 45 U/L — ABNORMAL HIGH (ref 0–44)
AST: 31 U/L (ref 15–41)
Albumin: 5.1 g/dL — ABNORMAL HIGH (ref 3.5–5.0)
Alkaline Phosphatase: 52 U/L (ref 38–126)
Anion gap: 11 (ref 5–15)
BUN: 24 mg/dL — ABNORMAL HIGH (ref 6–20)
CO2: 23 mmol/L (ref 22–32)
Calcium: 9.6 mg/dL (ref 8.9–10.3)
Chloride: 99 mmol/L (ref 98–111)
Creatinine, Ser: 2.03 mg/dL — ABNORMAL HIGH (ref 0.61–1.24)
GFR calc Af Amer: 42 mL/min — ABNORMAL LOW (ref >60)
GFR calc non Af Amer: 36 mL/min — ABNORMAL LOW (ref >60)
Glucose, Bld: 102 mg/dL — ABNORMAL HIGH (ref 70–99)
Potassium: 4 mmol/L (ref 3.5–5.1)
Sodium: 133 mmol/L — ABNORMAL LOW (ref 135–145)
Total Bilirubin: 1 mg/dL (ref 0.3–1.2)
Total Protein: 8.6 g/dL — ABNORMAL HIGH (ref 6.5–8.1)

## 2019-04-09 LAB — LIPASE, BLOOD: Lipase: 29 U/L (ref 11–51)

## 2019-04-09 MED ORDER — FAMOTIDINE IN NACL 20-0.9 MG/50ML-% IV SOLN
20.0000 mg | Freq: Once | INTRAVENOUS | Status: AC
  Administered 2019-04-09: 20 mg via INTRAVENOUS
  Filled 2019-04-09: qty 50

## 2019-04-09 MED ORDER — SODIUM CHLORIDE 0.9 % IV BOLUS
2000.0000 mL | Freq: Once | INTRAVENOUS | Status: AC
  Administered 2019-04-09: 2000 mL via INTRAVENOUS

## 2019-04-09 MED ORDER — ONDANSETRON 4 MG PO TBDP
4.0000 mg | ORAL_TABLET | Freq: Once | ORAL | Status: AC | PRN
  Administered 2019-04-09: 4 mg via ORAL
  Filled 2019-04-09: qty 1

## 2019-04-09 NOTE — ED Provider Notes (Signed)
[facility] Emergency Department Provider Note       Time seen: ----------------------------------------- 11:06 PM on 04/09/2019 -----------------------------------------   I have reviewed the triage vital signs and the nursing notes.  HISTORY   Chief Complaint Emesis    HPI Devin Chan is a 54 y.o. male with a history of arthritis, asthma, depression who presents to the ED for vomiting for the last day.  Patient states he was recently hospitalized for same.  Patient states when he felt the symptoms coming on he decided to go and come to the hospital to get IV fluids.  He reports acid reflux last night before the vomiting began.  He no longer drinks alcohol, he has still been using marijuana.  Past Medical History:  Diagnosis Date  . Arthritis   . Asthma   . Depression     Patient Active Problem List   Diagnosis Date Noted  . Acute superficial gastritis without hemorrhage   . Barrett's esophagus determined by endoscopy   . Intractable vomiting   . Elevated LFTs   . Essential hypertension   . Leukocytosis   . Elevated total protein   . Cannabinoid hyperemesis syndrome 04/02/2019  . Acute kidney injury superimposed on CKD (HCC)   . High anion gap metabolic acidosis   . Dehydration   . Marijuana abuse   . Upper GI bleed 01/02/2014  . Hematemesis 01/02/2014  . Epigastric pain     Past Surgical History:  Procedure Laterality Date  . CLEFT PALATE REPAIR  age 66   [facility]  . ESOPHAGOGASTRODUODENOSCOPY (EGD) WITH PROPOFOL N/A 04/05/2019   Procedure: ESOPHAGOGASTRODUODENOSCOPY (EGD) WITH PROPOFOL;  Surgeon: Toney Reil, MD;  Location: [facility] ENDOSCOPY;  Service: Gastroenterology;  Laterality: N/A;  . NO PAST SURGERIES      Allergies Patient has no known allergies.  Social History Social History   Tobacco Use  . Smoking status: Current Every Day Smoker    Packs/day: 1.00    Years: 30.00    Pack years: 30.00    Types: Cigarettes     Last attempt to quit: 05/01/2011    Years since quitting: 7.9  . Smokeless tobacco: Never Used  . Tobacco comment: quit x 6 months at age 60  Substance Use Topics  . Alcohol use: Yes    Alcohol/week: 27.0 standard drinks    Types: 15 Cans of beer, 12 Standard drinks or equivalent per week  . Drug use: Yes    Types: Marijuana    Review of Systems Constitutional: Negative for fever. Cardiovascular: Negative for chest pain. Respiratory: Negative for shortness of breath. Gastrointestinal: Negative for abdominal pain, positive for persistent vomiting Musculoskeletal: Negative for back pain. Skin: Negative for rash. Neurological: Negative for headaches, focal weakness or numbness.  All systems negative/normal/unremarkable except as stated in the HPI  ____________________________________________   PHYSICAL EXAM:  VITAL SIGNS: ED Triage Vitals  Enc Vitals Group     BP 04/09/19 2118 114/87     Pulse Rate 04/09/19 2118 91     Resp 04/09/19 2118 18     Temp 04/09/19 2118 99.4 F (37.4 C)     Temp Source 04/09/19 2118 Oral     SpO2 04/09/19 2118 100 %     Weight 04/09/19 2119 150 lb (68 kg)     Height 04/09/19 2119 5\' 6"  (1.676 m)     Head Circumference --      Peak Flow --      Pain Score 04/09/19 2118  0     Pain Loc --      Pain Edu? --      Excl. in GC? --     Constitutional: Alert and oriented. Well appearing and in no distress. Eyes: Conjunctivae are normal. Normal extraocular movements. Cardiovascular: Normal rate, regular rhythm. No murmurs, rubs, or gallops. Respiratory: Normal respiratory effort without tachypnea nor retractions. Breath sounds are clear and equal bilaterally. No wheezes/rales/rhonchi. Gastrointestinal: Soft and nontender. Normal bowel sounds Musculoskeletal: Nontender with normal range of motion in extremities. No lower extremity tenderness nor edema. Neurologic:  Normal speech and language. No gross focal neurologic deficits are appreciated.   Skin:  Skin is warm, dry and intact. No rash noted. Psychiatric: Mood and affect are normal. Speech and behavior are normal.  ____________________________________________  ED COURSE:  As part of my medical decision making, I reviewed the following data within the electronic MEDICAL RECORD NUMBER History obtained from family if available, nursing notes, old chart and ekg, as well as notes from prior ED visits. Patient presented for persistent vomiting, we will assess with labs and imaging as indicated at this time. Clinical Course as of Apr 09 146  Sat Apr 10, 2019  0057 Cannabinoid 50 Ng, Ur [place](!): POSITIVE [JW]    Clinical Course User Index [JW] Emily Filbert, MD   Procedures  Devin Chan was evaluated in Emergency Department on 04/09/2019 for the symptoms described in the history of present illness. He was evaluated in the context of the global COVID-19 pandemic, which necessitated consideration that the patient might be at risk for infection with the SARS-CoV-2 virus that causes COVID-19. Institutional protocols and algorithms that pertain to the evaluation of patients at risk for COVID-19 are in a state of rapid change based on information released by regulatory bodies including the CDC and federal and state organizations. These policies and algorithms were followed during the patient's care in the ED.  ____________________________________________   LABS (pertinent positives/negatives)  Labs Reviewed  COMPREHENSIVE METABOLIC PANEL - Abnormal; Notable for the following components:      Result Value   Sodium 133 (*)    Glucose, Bld 102 (*)    BUN 24 (*)    Creatinine, Ser 2.03 (*)    Total Protein 8.6 (*)    Albumin 5.1 (*)    ALT 45 (*)    GFR calc non Af Amer 36 (*)    GFR calc Af Amer 42 (*)    All other components within normal limits  CBC - Abnormal; Notable for the following components:   WBC 20.8 (*)    Platelets 421 (*)    All other components within normal  limits  URINALYSIS, COMPLETE (UACMP) WITH MICROSCOPIC - Abnormal; Notable for the following components:   Color, Urine AMBER (*)    APPearance HAZY (*)    Protein, ur 30 (*)    Bacteria, UA RARE (*)    All other components within normal limits  URINE DRUG SCREEN, QUALITATIVE ([facility] ONLY) - Abnormal; Notable for the following components:   Cannabinoid 50 Ng, Ur [place] POSITIVE (*)    All other components within normal limits  BASIC METABOLIC PANEL - Abnormal; Notable for the following components:   BUN 22 (*)    Creatinine, Ser 1.51 (*)    Calcium 8.3 (*)    GFR calc non Af Amer 52 (*)    All other components within normal limits  LIPASE, BLOOD  ____________________________________________   DIFFERENTIAL DIAGNOSIS   Cannabinoid  hyperemesis syndrome, GERD, peptic ulcer disease, gastritis, alcohol use disorder, alcoholic ketoacidosis  FINAL ASSESSMENT AND PLAN  Vomiting, acute kidney injury   Plan: The patient had presented for intractable vomiting. Patient's labs did reveal leukocytosis with elevated BUN and creatinine which seem to occur last time as well.  He was given 2 L of IV fluids and actually looks well clinically at the time my examination.  Blood work was much improved after fluids and he has been able to keep liquids down here.  He is cleared for outpatient follow-up.   Laurence Aly, MD    Note: This note was generated in part or whole with voice recognition software. Voice recognition is usually quite accurate but there are transcription errors that can and very often do occur. I apologize for any typographical errors that were not detected and corrected.     Earleen Newport, MD 04/10/19 [phone]

## 2019-04-09 NOTE — ED Triage Notes (Signed)
Pt arrives POV to triage with c/o emesis x 1 day. Pt was recently hospitalized for the same and was DC Monday. Pt reports acid reflux last night before vomiting began.

## 2019-04-09 NOTE — ED Notes (Signed)
pT resting in bed with phone in hand. Reports no pain and comfortable. Call light in reach

## 2019-04-10 LAB — URINE DRUG SCREEN, QUALITATIVE (ARMC ONLY)
Amphetamines, Ur Screen: NOT DETECTED
Barbiturates, Ur Screen: NOT DETECTED
Benzodiazepine, Ur Scrn: NOT DETECTED
Cannabinoid 50 Ng, Ur ~~LOC~~: POSITIVE — AB
Cocaine Metabolite,Ur ~~LOC~~: NOT DETECTED
MDMA (Ecstasy)Ur Screen: NOT DETECTED
Methadone Scn, Ur: NOT DETECTED
Opiate, Ur Screen: NOT DETECTED
Phencyclidine (PCP) Ur S: NOT DETECTED
Tricyclic, Ur Screen: NOT DETECTED

## 2019-04-10 LAB — BASIC METABOLIC PANEL
Anion gap: 6 (ref 5–15)
BUN: 22 mg/dL — ABNORMAL HIGH (ref 6–20)
CO2: 25 mmol/L (ref 22–32)
Calcium: 8.3 mg/dL — ABNORMAL LOW (ref 8.9–10.3)
Chloride: 105 mmol/L (ref 98–111)
Creatinine, Ser: 1.51 mg/dL — ABNORMAL HIGH (ref 0.61–1.24)
GFR calc Af Amer: >60 mL/min (ref >60)
GFR calc non Af Amer: 52 mL/min — ABNORMAL LOW (ref >60)
Glucose, Bld: 87 mg/dL (ref 70–99)
Potassium: 3.5 mmol/L (ref 3.5–5.1)
Sodium: 136 mmol/L (ref 135–145)

## 2019-04-10 MED ORDER — PROMETHAZINE HCL 25 MG RE SUPP: 25.0000 mg | Freq: Four times a day (QID) | RECTAL | 1 refills | Status: DC | PRN

## 2019-04-10 MED ORDER — ONDANSETRON HCL 4 MG PO TABS: 4.0000 mg | ORAL_TABLET | Freq: Every day | ORAL | 1 refills | Status: DC | PRN

## 2019-05-31 ENCOUNTER — Encounter: Payer: Self-pay | Admitting: Gastroenterology

## 2019-05-31 ENCOUNTER — Ambulatory Visit: Payer: Self-pay | Admitting: Gastroenterology

## 2020-05-28 IMAGING — CT CT ABD-PELV W/O CM
3 of 5 series · 14 of 36 positions shown, 15 images · non-contrast
Comparison: Chest radiograph dated 04/02/2019 and CT abdomen pelvis
dated 03/23/2018.

CLINICAL DATA: 53-year-old male with abdominal distention, nausea
vomiting.

EXAM:
CT CHEST, ABDOMEN AND PELVIS WITHOUT CONTRAST
TECHNIQUE: Multidetector CT imaging of the chest, abdomen and pelvis was
performed following the standard protocol without IV contrast.

[Series 509: cap thins · axial · 0.77mm/px · z∈[-914,-374]mm · 8 of 1322 slices shown]
[im 121/1322  lung]
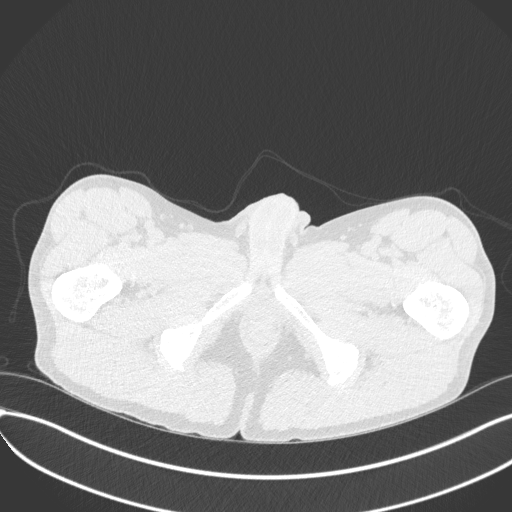
[im 241/1322  lung]
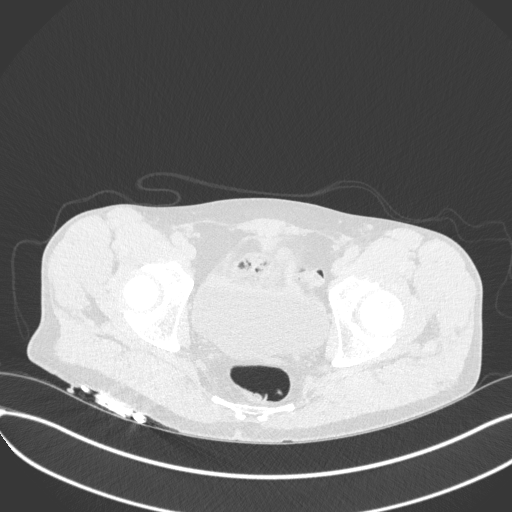
[im 421/1322  lung]
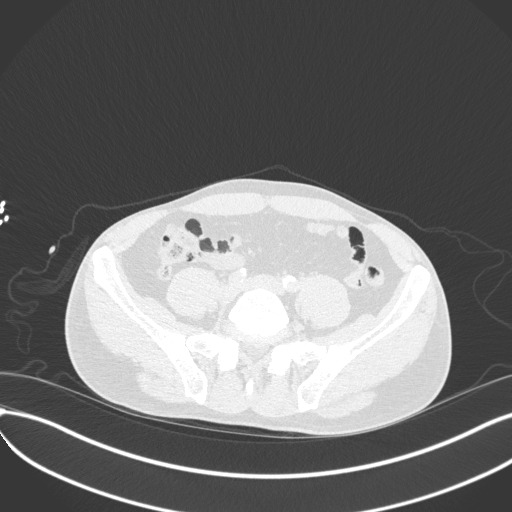
[im 601/1322  lung]
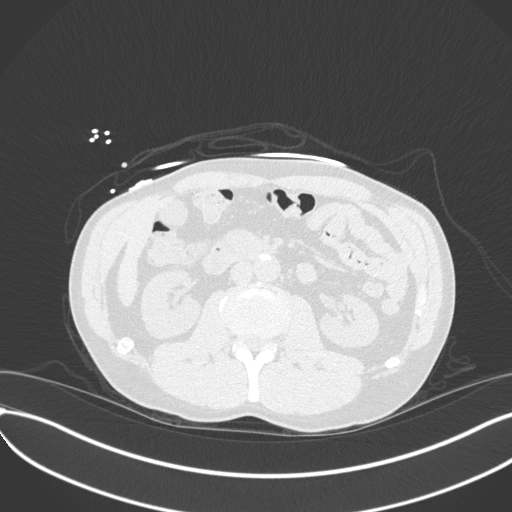
[im 721/1322  lung]
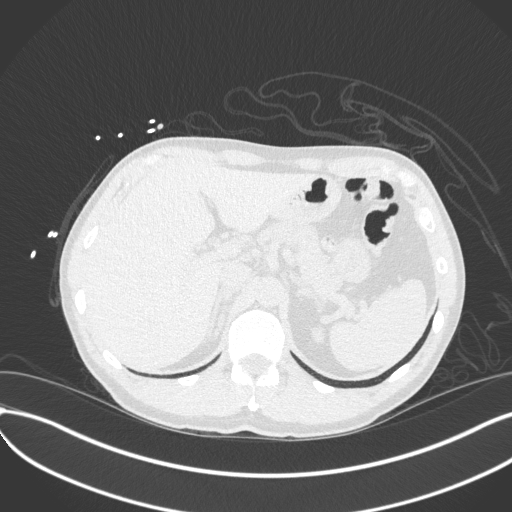
[im 901/1322  lung]
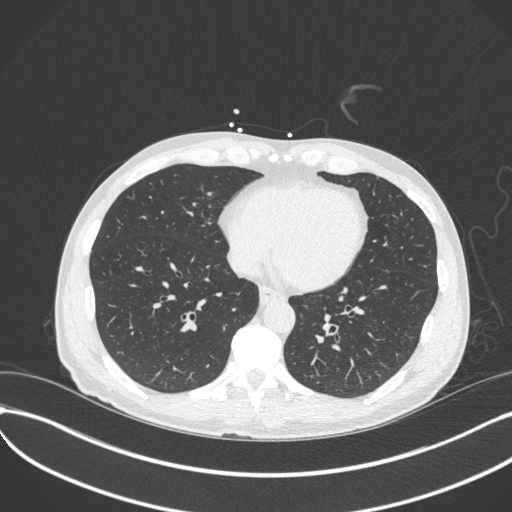
[im 1081/1322  lung]
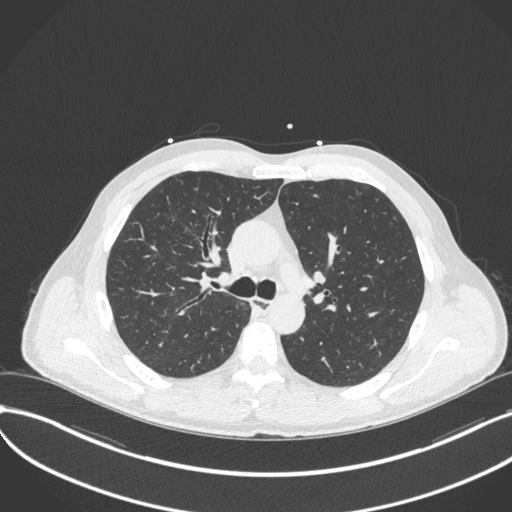
[im 1201/1322  lung]
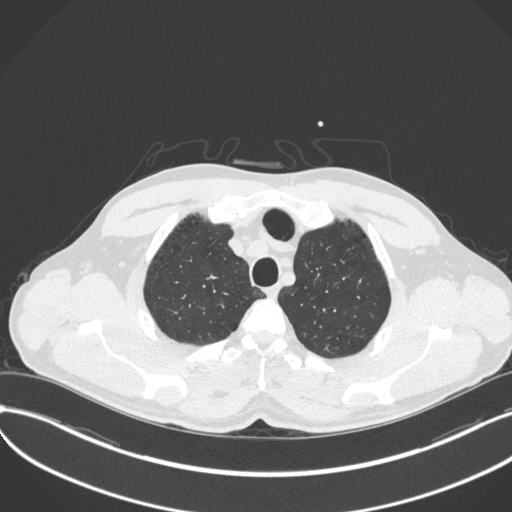

[Series 510: coronal · coronal · 0.76mm/px · 3 of 122 slices shown]
[im 25/122  lung]
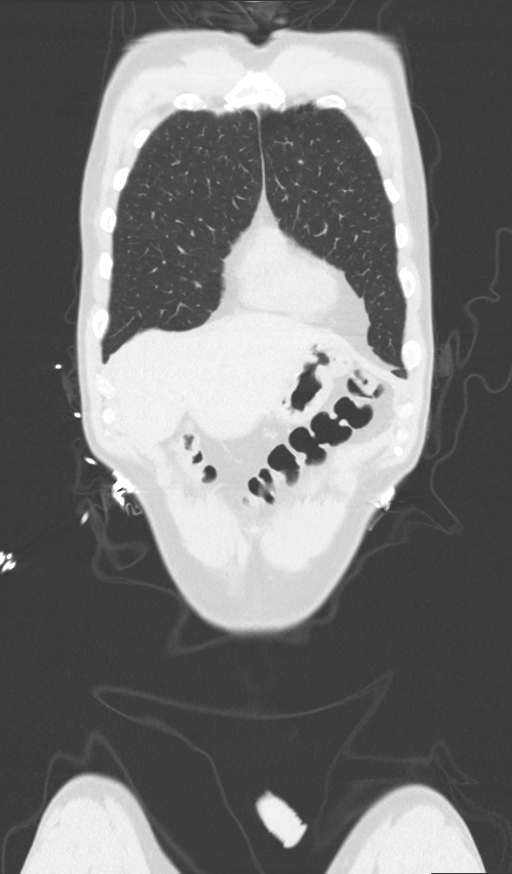
[im 49/122  lung]
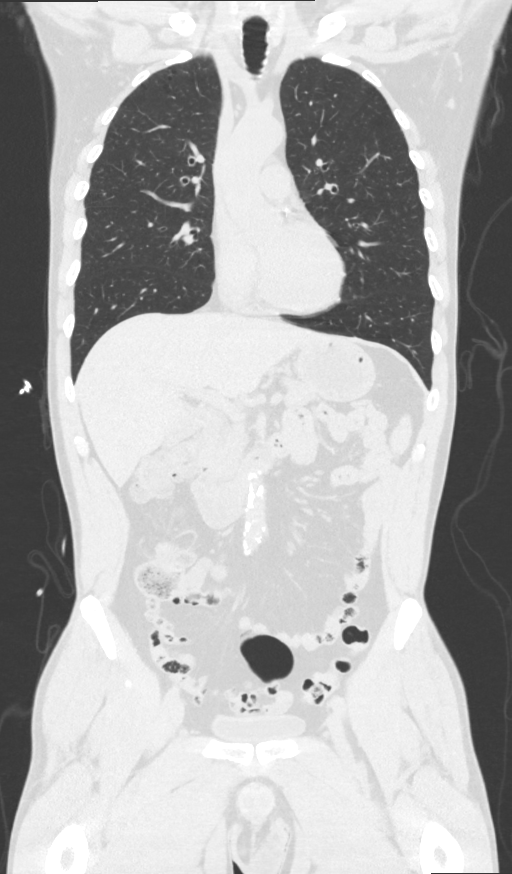
[im 73/122  lung]
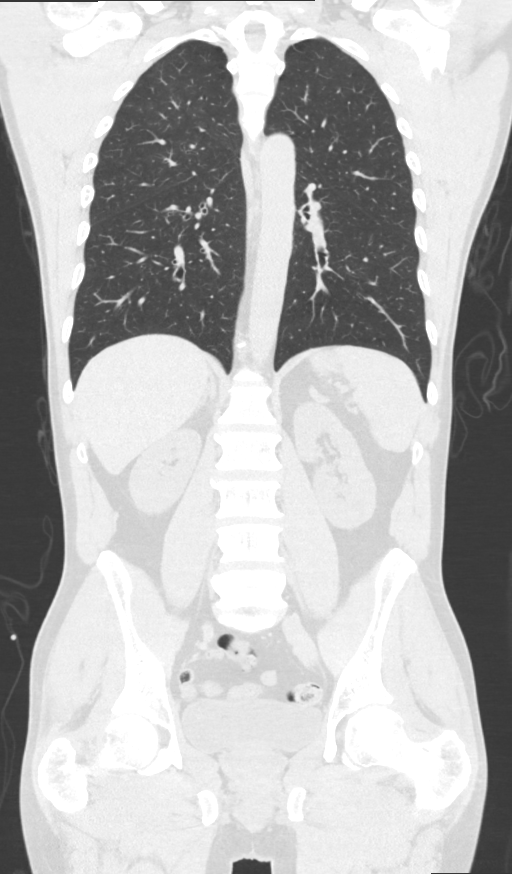

[Series 513: lungs · axial · 0.77mm/px · z∈[-643,-313]mm · 3 of 166 slices shown, 4 images]
[im 1/166  mediastinal]
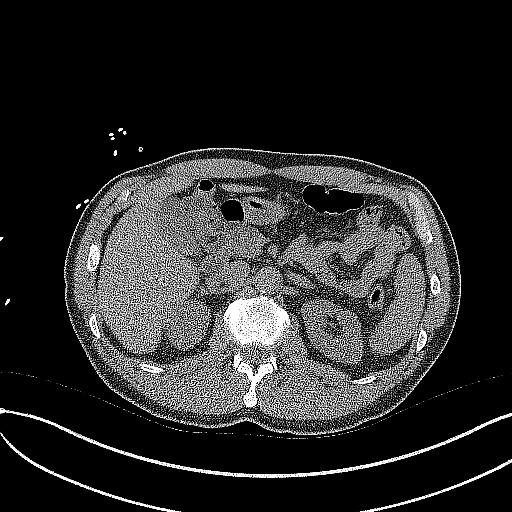
[im 1/166  lung]
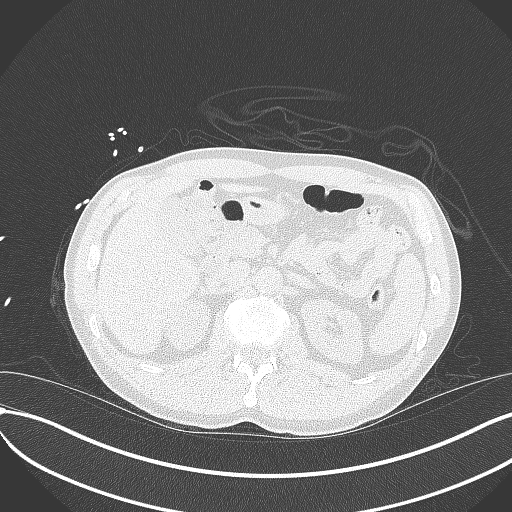
[im 83/166  lung]
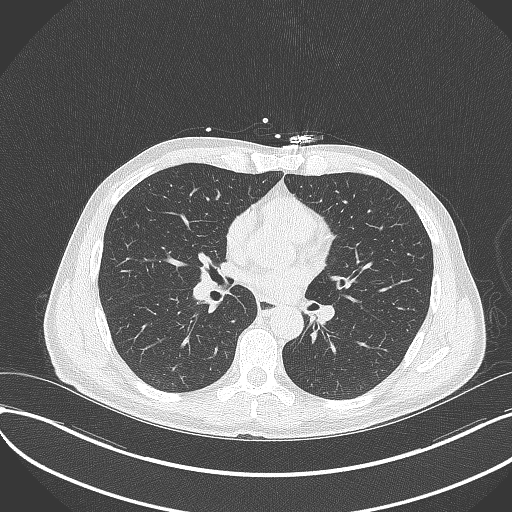
[im 166/166  lung]
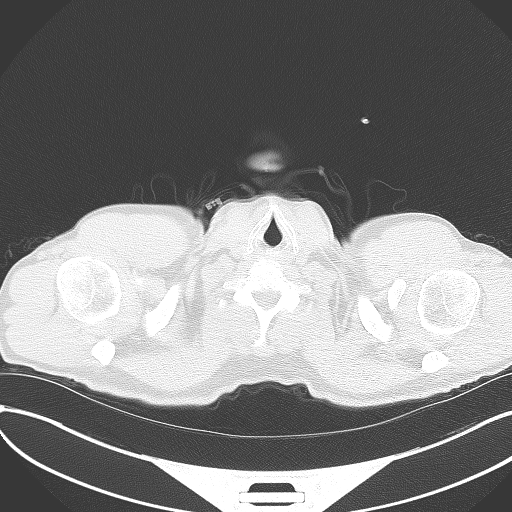

[14 of 36 positions shown; findings below may reference images not displayed]

FINDINGS: Evaluation of this exam is limited in the absence of intravenous
contrast.

CT CHEST FINDINGS

Cardiovascular: There is no cardiomegaly or pericardial effusion.
There is coronary vascular calcification of the LAD. The thoracic
aorta and central pulmonary arteries are grossly unremarkable on
this noncontrast CT.

Mediastinum/Nodes: There is no hilar or mediastinal adenopathy. The
esophagus is grossly unremarkable. No mediastinal fluid collection.

Lungs/Pleura: The lungs are clear. There is no pleural effusion or
pneumothorax. The central airways are patent.

Musculoskeletal: No chest wall mass or suspicious bone lesions
identified.

CT ABDOMEN PELVIS FINDINGS

No intra-abdominal free air or free fluid.

Hepatobiliary: No focal liver abnormality is seen. No gallstones,
gallbladder wall thickening, or biliary dilatation.

Pancreas: Unremarkable. No pancreatic ductal dilatation or
surrounding inflammatory changes.

Spleen: Normal in size without focal abnormality.

Adrenals/Urinary Tract: The adrenal glands are unremarkable. There
is no hydronephrosis or nephrolithiasis on either side.
Subcentimeter faintly visualized hypodense lesion in the inferior
pole of the left kidney is not characterized on this CT but was
present on the prior CT and likely represents a cyst. Ultrasound may
provide better evaluation. The visualized ureters and urinary
bladder appear unremarkable.

Stomach/Bowel: There is no bowel obstruction or active inflammation.
The appendix is normal.

Vascular/Lymphatic: Mild aortoiliac atherosclerotic disease. The IVC
is unremarkable. No portal venous gas. There is no adenopathy.

Reproductive: The prostate and seminal vesicles are grossly
unremarkable. No pelvic mass.

Other: None

Musculoskeletal: Bilateral L5 pars defects with grade 1 L5-S1
anterolisthesis. There is disc desiccation and diffuse disc bulge at
L5-S1. No acute osseous pathology.
IMPRESSION: 1. No acute intrathoracic, abdominal, or pelvic pathology.
2. Mild Aortic Atherosclerosis (FLGHB-GQ5.5).

## 2020-05-28 IMAGING — CT CT CHEST W/O CM
2 of 4 series · 13 of 36 positions shown, 16 images · non-contrast
Comparison: Chest radiograph dated 04/02/2019 and CT abdomen pelvis
dated 03/23/2018.

CLINICAL DATA: 53-year-old male with abdominal distention, nausea
vomiting.

EXAM:
CT CHEST, ABDOMEN AND PELVIS WITHOUT CONTRAST
TECHNIQUE: Multidetector CT imaging of the chest, abdomen and pelvis was
performed following the standard protocol without IV contrast.

[Series 2: cap wo st · axial · 0.77mm/px · z∈[-913,-373]mm · 10 of 133 slices shown, 13 images]
[im 13/133  mediastinal]
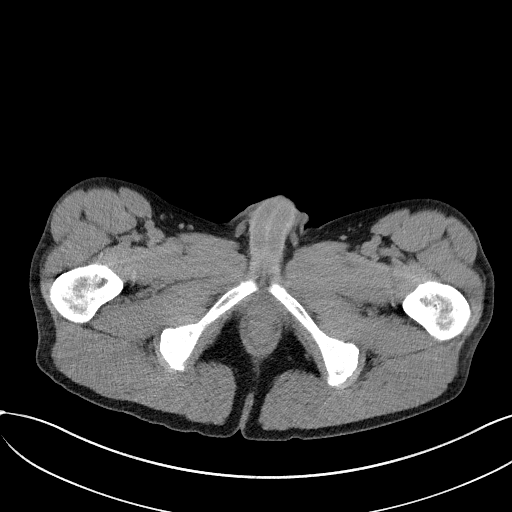
[im 13/133  lung]
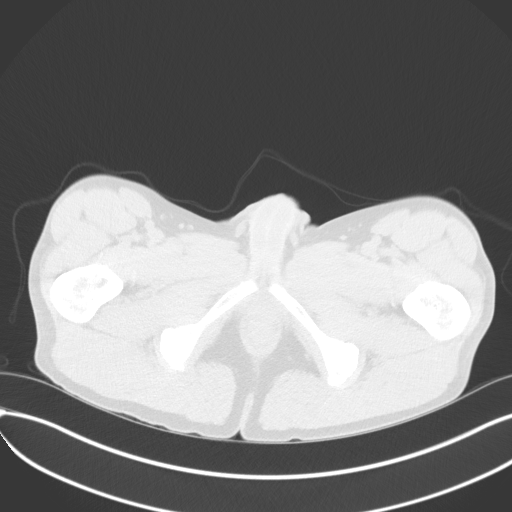
[im 25/133  lung]
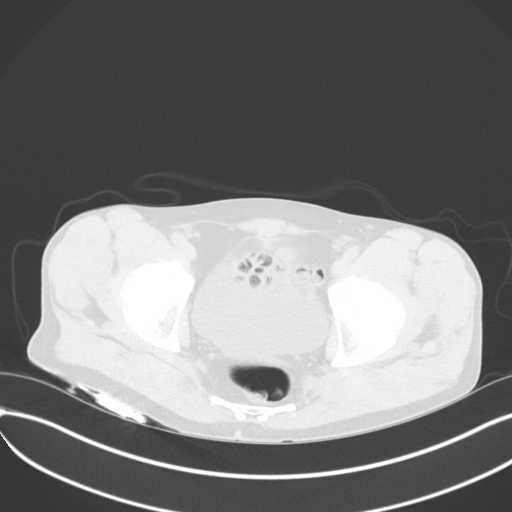
[im 37/133  lung]
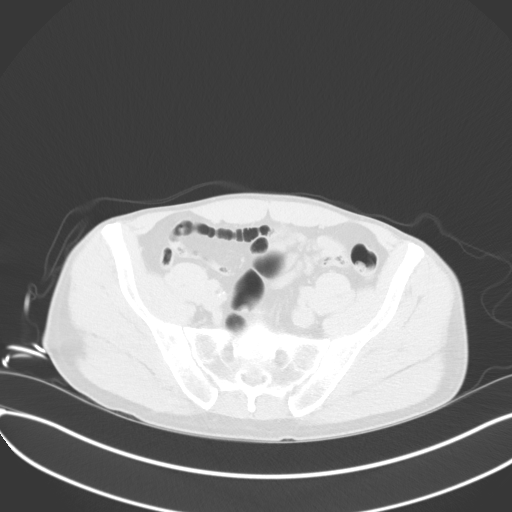
[im 49/133  lung]
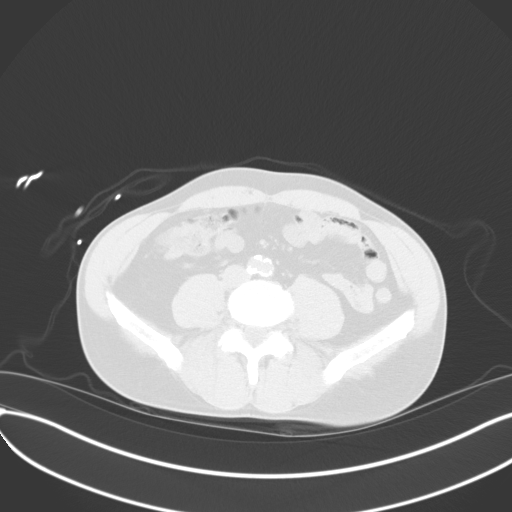
[im 61/133  mediastinal]
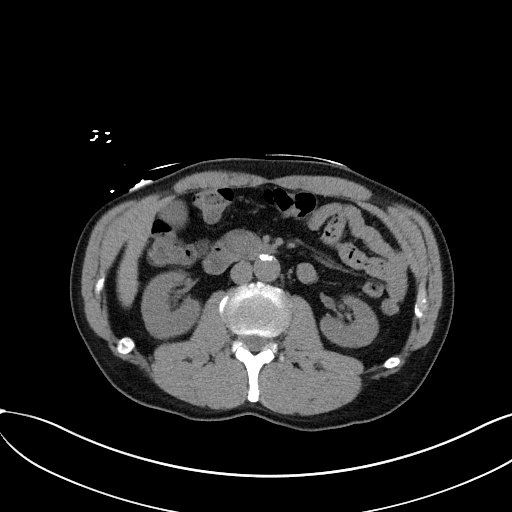
[im 61/133  lung]
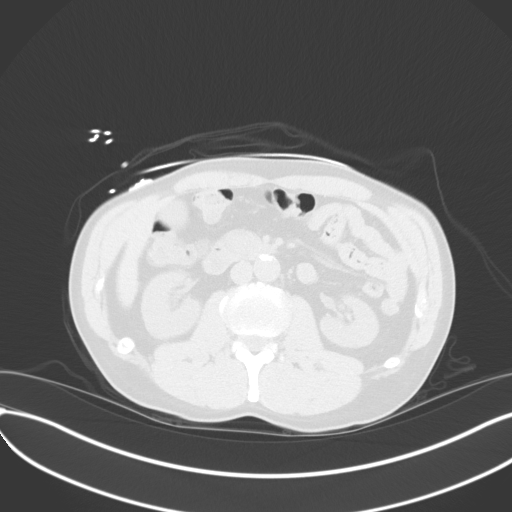
[im 73/133  lung]
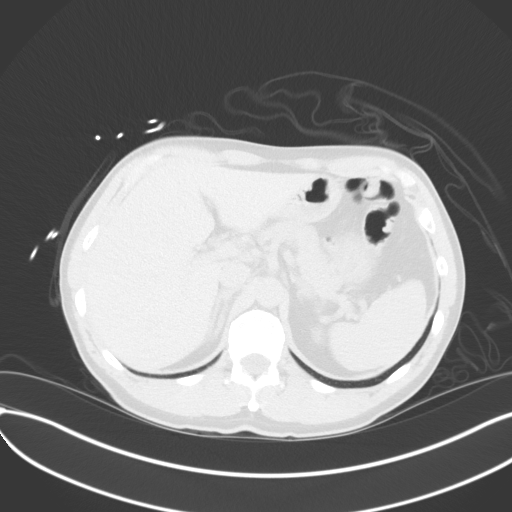
[im 85/133  lung]
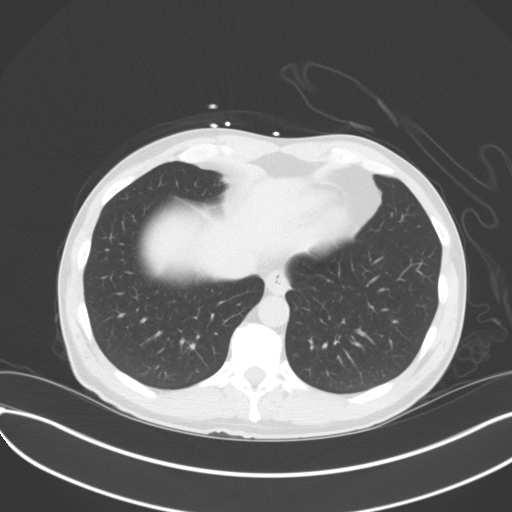
[im 97/133  lung]
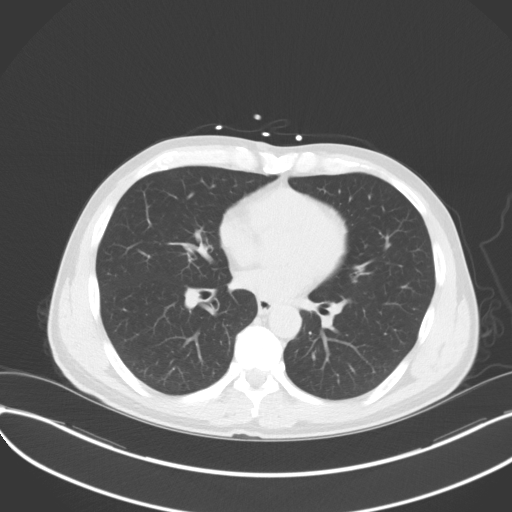
[im 109/133  mediastinal]
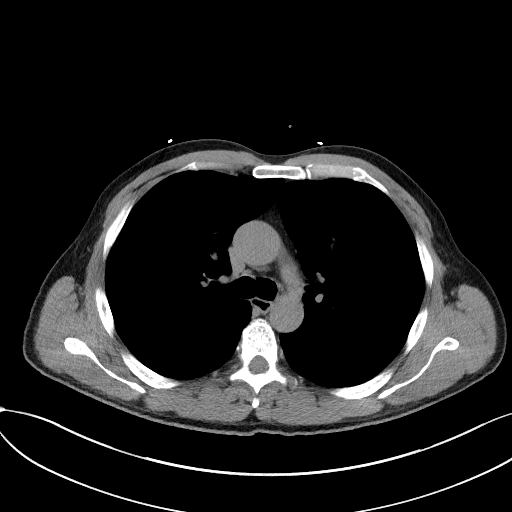
[im 109/133  lung]
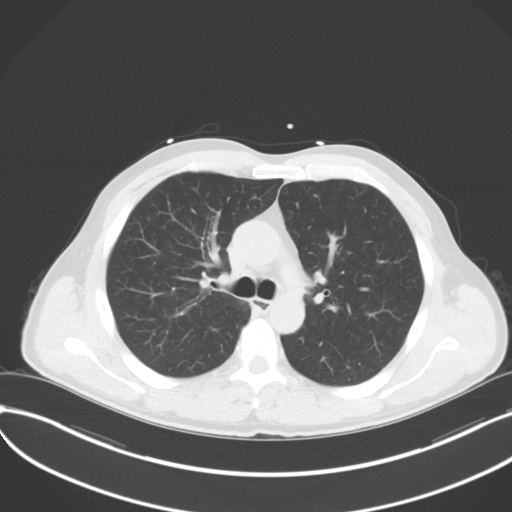
[im 121/133  lung]
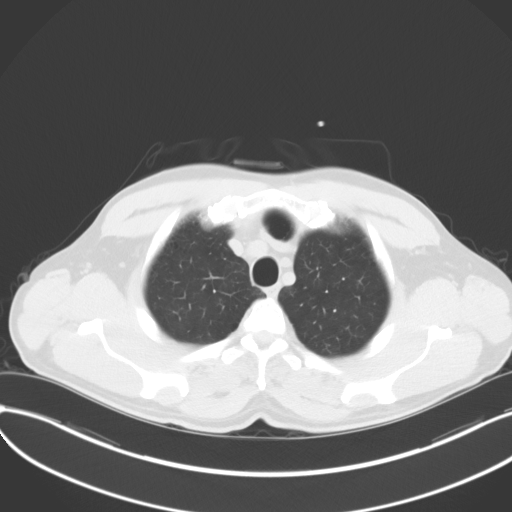

[Series 4: coronal · coronal · 0.76mm/px · 3 of 122 slices shown]
[im 25/122  lung]
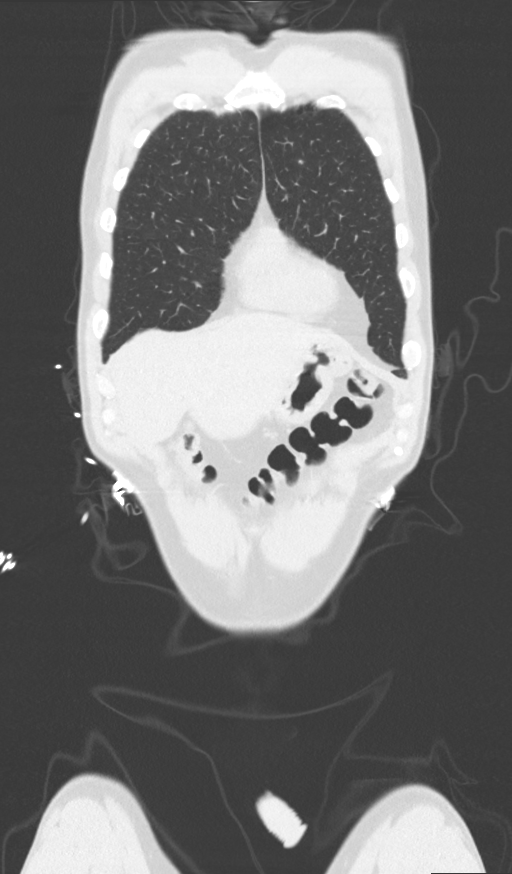
[im 49/122  lung]
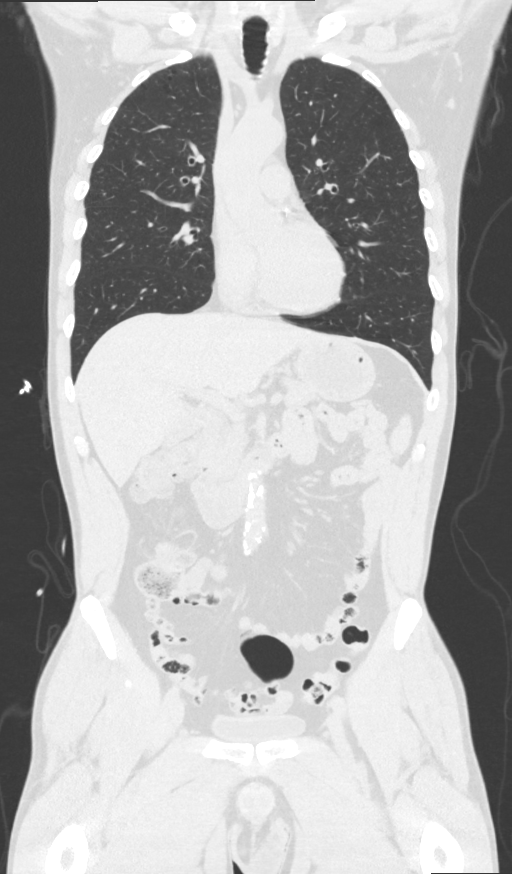
[im 73/122  lung]
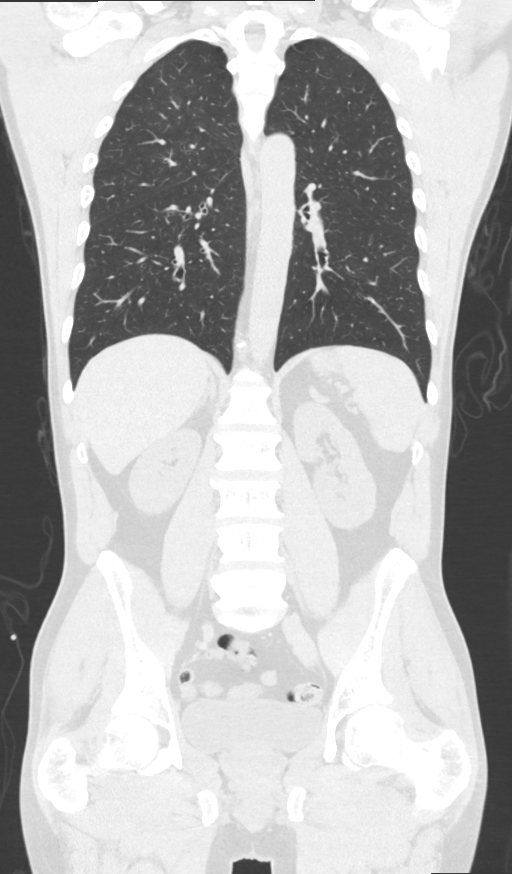

[13 of 36 positions shown; findings below may reference images not displayed]

FINDINGS: Evaluation of this exam is limited in the absence of intravenous
contrast.

CT CHEST FINDINGS

Cardiovascular: There is no cardiomegaly or pericardial effusion.
There is coronary vascular calcification of the LAD. The thoracic
aorta and central pulmonary arteries are grossly unremarkable on
this noncontrast CT.

Mediastinum/Nodes: There is no hilar or mediastinal adenopathy. The
esophagus is grossly unremarkable. No mediastinal fluid collection.

Lungs/Pleura: The lungs are clear. There is no pleural effusion or
pneumothorax. The central airways are patent.

Musculoskeletal: No chest wall mass or suspicious bone lesions
identified.

CT ABDOMEN PELVIS FINDINGS

No intra-abdominal free air or free fluid.

Hepatobiliary: No focal liver abnormality is seen. No gallstones,
gallbladder wall thickening, or biliary dilatation.

Pancreas: Unremarkable. No pancreatic ductal dilatation or
surrounding inflammatory changes.

Spleen: Normal in size without focal abnormality.

Adrenals/Urinary Tract: The adrenal glands are unremarkable. There
is no hydronephrosis or nephrolithiasis on either side.
Subcentimeter faintly visualized hypodense lesion in the inferior
pole of the left kidney is not characterized on this CT but was
present on the prior CT and likely represents a cyst. Ultrasound may
provide better evaluation. The visualized ureters and urinary
bladder appear unremarkable.

Stomach/Bowel: There is no bowel obstruction or active inflammation.
The appendix is normal.

Vascular/Lymphatic: Mild aortoiliac atherosclerotic disease. The IVC
is unremarkable. No portal venous gas. There is no adenopathy.

Reproductive: The prostate and seminal vesicles are grossly
unremarkable. No pelvic mass.

Other: None

Musculoskeletal: Bilateral L5 pars defects with grade 1 L5-S1
anterolisthesis. There is disc desiccation and diffuse disc bulge at
L5-S1. No acute osseous pathology.
IMPRESSION: 1. No acute intrathoracic, abdominal, or pelvic pathology.
2. Mild Aortic Atherosclerosis (FLGHB-GQ5.5).

## 2020-08-09 ENCOUNTER — Inpatient Hospital Stay
Admission: EM | Admit: 2020-08-09 | Discharge: 2020-08-11 | DRG: 368 | Disposition: A | Discharge status: Home / Self Care | Payer: Self-pay | Attending: Family Medicine | Admitting: Family Medicine

## 2020-08-09 ENCOUNTER — Other Ambulatory Visit: Payer: Self-pay

## 2020-08-09 ENCOUNTER — Encounter: Payer: Self-pay | Admitting: *Deleted

## 2020-08-09 ENCOUNTER — Encounter
Admission: EM | Disposition: A | Discharge status: Home / Self Care | Payer: Self-pay | Source: Home / Self Care | Attending: Family Medicine

## 2020-08-09 ENCOUNTER — Observation Stay: Payer: Self-pay | Admitting: Anesthesiology

## 2020-08-09 ENCOUNTER — Emergency Department: Payer: Self-pay

## 2020-08-09 DIAGNOSIS — K2091 Esophagitis, unspecified with bleeding: Principal | ICD-10-CM | POA: Diagnosis present

## 2020-08-09 DIAGNOSIS — K2971 Gastritis, unspecified, with bleeding: Secondary | ICD-10-CM | POA: Diagnosis present

## 2020-08-09 DIAGNOSIS — E860 Dehydration: Secondary | ICD-10-CM | POA: Diagnosis present

## 2020-08-09 DIAGNOSIS — R1013 Epigastric pain: Secondary | ICD-10-CM | POA: Diagnosis present

## 2020-08-09 DIAGNOSIS — K920 Hematemesis: Secondary | ICD-10-CM | POA: Diagnosis present

## 2020-08-09 DIAGNOSIS — M1990 Unspecified osteoarthritis, unspecified site: Secondary | ICD-10-CM | POA: Diagnosis present

## 2020-08-09 DIAGNOSIS — Z813 Family history of other psychoactive substance abuse and dependence: Secondary | ICD-10-CM

## 2020-08-09 DIAGNOSIS — I10 Essential (primary) hypertension: Secondary | ICD-10-CM | POA: Diagnosis present

## 2020-08-09 DIAGNOSIS — E869 Volume depletion, unspecified: Secondary | ICD-10-CM | POA: Diagnosis present

## 2020-08-09 DIAGNOSIS — J45909 Unspecified asthma, uncomplicated: Secondary | ICD-10-CM | POA: Diagnosis present

## 2020-08-09 DIAGNOSIS — K2950 Unspecified chronic gastritis without bleeding: Secondary | ICD-10-CM | POA: Diagnosis present

## 2020-08-09 DIAGNOSIS — K2970 Gastritis, unspecified, without bleeding: Secondary | ICD-10-CM | POA: Diagnosis present

## 2020-08-09 DIAGNOSIS — Z79899 Other long term (current) drug therapy: Secondary | ICD-10-CM

## 2020-08-09 DIAGNOSIS — K922 Gastrointestinal hemorrhage, unspecified: Secondary | ICD-10-CM

## 2020-08-09 DIAGNOSIS — K449 Diaphragmatic hernia without obstruction or gangrene: Secondary | ICD-10-CM | POA: Diagnosis present

## 2020-08-09 DIAGNOSIS — K2270 Barrett's esophagus without dysplasia: Secondary | ICD-10-CM | POA: Diagnosis present

## 2020-08-09 DIAGNOSIS — Z885 Allergy status to narcotic agent status: Secondary | ICD-10-CM

## 2020-08-09 DIAGNOSIS — N179 Acute kidney failure, unspecified: Secondary | ICD-10-CM | POA: Diagnosis present

## 2020-08-09 DIAGNOSIS — F32A Depression, unspecified: Secondary | ICD-10-CM | POA: Diagnosis present

## 2020-08-09 DIAGNOSIS — Z823 Family history of stroke: Secondary | ICD-10-CM

## 2020-08-09 DIAGNOSIS — F17210 Nicotine dependence, cigarettes, uncomplicated: Secondary | ICD-10-CM | POA: Diagnosis present

## 2020-08-09 DIAGNOSIS — Z8249 Family history of ischemic heart disease and other diseases of the circulatory system: Secondary | ICD-10-CM

## 2020-08-09 DIAGNOSIS — R112 Nausea with vomiting, unspecified: Secondary | ICD-10-CM

## 2020-08-09 DIAGNOSIS — Z20822 Contact with and (suspected) exposure to covid-19: Secondary | ICD-10-CM | POA: Diagnosis present

## 2020-08-09 DIAGNOSIS — R935 Abnormal findings on diagnostic imaging of other abdominal regions, including retroperitoneum: Secondary | ICD-10-CM

## 2020-08-09 DIAGNOSIS — R6510 Systemic inflammatory response syndrome (SIRS) of non-infectious origin without acute organ dysfunction: Secondary | ICD-10-CM | POA: Diagnosis present

## 2020-08-09 DIAGNOSIS — I129 Hypertensive chronic kidney disease with stage 1 through stage 4 chronic kidney disease, or unspecified chronic kidney disease: Secondary | ICD-10-CM | POA: Diagnosis present

## 2020-08-09 DIAGNOSIS — N1831 Chronic kidney disease, stage 3a: Secondary | ICD-10-CM | POA: Diagnosis present

## 2020-08-09 DIAGNOSIS — K2100 Gastro-esophageal reflux disease with esophagitis, without bleeding: Secondary | ICD-10-CM | POA: Diagnosis present

## 2020-08-09 HISTORY — PX: ESOPHAGOGASTRODUODENOSCOPY: SHX5428

## 2020-08-09 LAB — CBC
HCT: 49.4 % (ref 39.0–52.0)
HCT: 41 % (ref 39.0–52.0)
HCT: 42.5 % (ref 39.0–52.0)
HCT: 41.1 % (ref 39.0–52.0)
Hemoglobin: 17 g/dL (ref 13.0–17.0)
Hemoglobin: 14.1 g/dL (ref 13.0–17.0)
Hemoglobin: 14.8 g/dL (ref 13.0–17.0)
Hemoglobin: 14.5 g/dL (ref 13.0–17.0)
MCH: 29.8 pg (ref 26.0–34.0)
MCH: 30.7 pg (ref 26.0–34.0)
MCH: 31 pg (ref 26.0–34.0)
MCH: 30.5 pg (ref 26.0–34.0)
MCHC: 34.4 g/dL (ref 30.0–36.0)
MCHC: 34.4 g/dL (ref 30.0–36.0)
MCHC: 34.8 g/dL (ref 30.0–36.0)
MCHC: 35.3 g/dL (ref 30.0–36.0)
MCV: 86.7 fL (ref 80.0–100.0)
MCV: 89.1 fL (ref 80.0–100.0)
MCV: 89.1 fL (ref 80.0–100.0)
MCV: 86.5 fL (ref 80.0–100.0)
Platelets: 441 10*3/uL — ABNORMAL HIGH (ref 150–400)
Platelets: 292 10*3/uL (ref 150–400)
Platelets: 316 10*3/uL (ref 150–400)
Platelets: 292 10*3/uL (ref 150–400)
RBC: 5.7 MIL/uL (ref 4.22–5.81)
RBC: 4.6 MIL/uL (ref 4.22–5.81)
RBC: 4.77 MIL/uL (ref 4.22–5.81)
RBC: 4.75 MIL/uL (ref 4.22–5.81)
RDW: 14 % (ref 11.5–15.5)
RDW: 14.1 % (ref 11.5–15.5)
RDW: 14.3 % (ref 11.5–15.5)
RDW: 14.1 % (ref 11.5–15.5)
WBC: 29.3 10*3/uL — ABNORMAL HIGH (ref 4.0–10.5)
WBC: 21.7 10*3/uL — ABNORMAL HIGH (ref 4.0–10.5)
WBC: 18.8 10*3/uL — ABNORMAL HIGH (ref 4.0–10.5)
WBC: 19.6 10*3/uL — ABNORMAL HIGH (ref 4.0–10.5)
nRBC: 0 % (ref 0.0–0.2)
nRBC: 0 % (ref 0.0–0.2)
nRBC: 0 % (ref 0.0–0.2)
nRBC: 0 % (ref 0.0–0.2)

## 2020-08-09 LAB — URINALYSIS, COMPLETE (UACMP) WITH MICROSCOPIC
Bacteria, UA: NONE SEEN
Bilirubin Urine: NEGATIVE
Glucose, UA: NEGATIVE mg/dL
Ketones, ur: NEGATIVE mg/dL
Leukocytes,Ua: NEGATIVE
Nitrite: NEGATIVE
Protein, ur: 30 mg/dL — AB
Specific Gravity, Urine: 1.014 (ref 1.005–1.030)
pH: 5 (ref 5.0–8.0)

## 2020-08-09 LAB — COMPREHENSIVE METABOLIC PANEL
ALT: 19 U/L (ref 0–44)
AST: 33 U/L (ref 15–41)
Albumin: 5.7 g/dL — ABNORMAL HIGH (ref 3.5–5.0)
Alkaline Phosphatase: 64 U/L (ref 38–126)
Anion gap: 12 (ref 5–15)
BUN: 40 mg/dL — ABNORMAL HIGH (ref 6–20)
CO2: 22 mmol/L (ref 22–32)
Calcium: 11.3 mg/dL — ABNORMAL HIGH (ref 8.9–10.3)
Chloride: 96 mmol/L — ABNORMAL LOW (ref 98–111)
Creatinine, Ser: 3.98 mg/dL — ABNORMAL HIGH (ref 0.61–1.24)
GFR, Estimated: 17 mL/min — ABNORMAL LOW (ref >60)
Glucose, Bld: 160 mg/dL — ABNORMAL HIGH (ref 70–99)
Potassium: 4.1 mmol/L (ref 3.5–5.1)
Sodium: 130 mmol/L — ABNORMAL LOW (ref 135–145)
Total Bilirubin: 1.5 mg/dL — ABNORMAL HIGH (ref 0.3–1.2)
Total Protein: 9.7 g/dL — ABNORMAL HIGH (ref 6.5–8.1)

## 2020-08-09 LAB — URINE DRUG SCREEN, QUALITATIVE (ARMC ONLY)
Amphetamines, Ur Screen: NOT DETECTED
Barbiturates, Ur Screen: NOT DETECTED
Benzodiazepine, Ur Scrn: NOT DETECTED
Cannabinoid 50 Ng, Ur ~~LOC~~: POSITIVE — AB
Cocaine Metabolite,Ur ~~LOC~~: NOT DETECTED
MDMA (Ecstasy)Ur Screen: NOT DETECTED
Methadone Scn, Ur: NOT DETECTED
Opiate, Ur Screen: NOT DETECTED
Phencyclidine (PCP) Ur S: NOT DETECTED
Tricyclic, Ur Screen: NOT DETECTED

## 2020-08-09 LAB — HIV ANTIBODY (ROUTINE TESTING W REFLEX): HIV Screen 4th Generation wRfx: NONREACTIVE

## 2020-08-09 LAB — PROCALCITONIN: Procalcitonin: <0.1 ng/mL

## 2020-08-09 LAB — RESP PANEL BY RT-PCR (FLU A&B, COVID) ARPGX2
Influenza A by PCR: NEGATIVE
Influenza B by PCR: NEGATIVE
SARS Coronavirus 2 by RT PCR: NEGATIVE

## 2020-08-09 LAB — PROTIME-INR
INR: 1.1 (ref 0.8–1.2)
Prothrombin Time: 13.9 seconds (ref 11.4–15.2)

## 2020-08-09 LAB — LACTIC ACID, PLASMA: Lactic Acid, Venous: 1.7 mmol/L (ref 0.5–1.9)

## 2020-08-09 LAB — AMMONIA: Ammonia: 20 umol/L (ref 9–35)

## 2020-08-09 LAB — ETHANOL: Alcohol, Ethyl (B): <10 mg/dL (ref <10)

## 2020-08-09 LAB — LIPASE, BLOOD: Lipase: 58 U/L — ABNORMAL HIGH (ref 11–51)

## 2020-08-09 SURGERY — EGD (ESOPHAGOGASTRODUODENOSCOPY)
Anesthesia: General

## 2020-08-09 MED ORDER — HYDRALAZINE HCL 20 MG/ML IJ SOLN
10.0000 mg | Freq: Once | INTRAMUSCULAR | Status: AC
  Administered 2020-08-09: 10 mg via INTRAVENOUS

## 2020-08-09 MED ORDER — PANTOPRAZOLE 80MG IVPB - SIMPLE MED
80.0000 mg | Freq: Once | INTRAVENOUS | Status: AC
  Administered 2020-08-09: 80 mg via INTRAVENOUS
  Filled 2020-08-09: qty 100

## 2020-08-09 MED ORDER — SODIUM CHLORIDE 0.9 % IV BOLUS
1000.0000 mL | Freq: Once | INTRAVENOUS | Status: AC
  Administered 2020-08-09: 1000 mL via INTRAVENOUS

## 2020-08-09 MED ORDER — SODIUM CHLORIDE 0.9 % IV SOLN: INTRAVENOUS | Status: DC

## 2020-08-09 MED ORDER — PANTOPRAZOLE INFUSION (NEW) - SIMPLE MED
8.0000 mg/h | INTRAVENOUS | Status: DC
  Administered 2020-08-09 – 2020-08-10 (×3): 8 mg/h via INTRAVENOUS
  Filled 2020-08-09 (×2): qty 100
  Filled 2020-08-09: qty 80

## 2020-08-09 MED ORDER — ONDANSETRON HCL 4 MG/2ML IJ SOLN: 4.0000 mg | Freq: Three times a day (TID) | INTRAMUSCULAR | Status: DC | PRN

## 2020-08-09 MED ORDER — PROPOFOL 500 MG/50ML IV EMUL
INTRAVENOUS | Status: DC | PRN
  Administered 2020-08-09: 175 ug/kg/min via INTRAVENOUS

## 2020-08-09 MED ORDER — HYDRALAZINE HCL 20 MG/ML IJ SOLN: 5.0000 mg | INTRAMUSCULAR | Status: DC | PRN

## 2020-08-09 MED ORDER — ALBUTEROL SULFATE (2.5 MG/3ML) 0.083% IN NEBU: 2.5000 mg | INHALATION_SOLUTION | RESPIRATORY_TRACT | Status: DC | PRN

## 2020-08-09 MED ORDER — HYDROMORPHONE HCL 1 MG/ML IJ SOLN
0.5000 mg | INTRAMUSCULAR | Status: DC | PRN
  Administered 2020-08-09: 0.5 mg via INTRAVENOUS
  Filled 2020-08-09: qty 1

## 2020-08-09 MED ORDER — PROPOFOL 500 MG/50ML IV EMUL
INTRAVENOUS | Status: AC
  Filled 2020-08-09: qty 50

## 2020-08-09 MED ORDER — PROPOFOL 10 MG/ML IV BOLUS
INTRAVENOUS | Status: DC | PRN
  Administered 2020-08-09: 60 mg via INTRAVENOUS

## 2020-08-09 MED ORDER — SODIUM CHLORIDE 0.9 % IV SOLN
1.0000 g | Freq: Once | INTRAVENOUS | Status: AC
  Administered 2020-08-09: 1 g via INTRAVENOUS
  Filled 2020-08-09: qty 10

## 2020-08-09 MED ORDER — ONDANSETRON 4 MG PO TBDP
4.0000 mg | ORAL_TABLET | Freq: Once | ORAL | Status: AC | PRN
  Administered 2020-08-09: 4 mg via ORAL
  Filled 2020-08-09: qty 1

## 2020-08-09 MED ORDER — NICOTINE 21 MG/24HR TD PT24
21.0000 mg | MEDICATED_PATCH | Freq: Every day | TRANSDERMAL | Status: DC
  Administered 2020-08-10 – 2020-08-11 (×2): 21 mg via TRANSDERMAL
  Filled 2020-08-09 (×3): qty 1

## 2020-08-09 MED ORDER — HYDRALAZINE HCL 20 MG/ML IJ SOLN
INTRAMUSCULAR | Status: AC
  Filled 2020-08-09: qty 1

## 2020-08-09 MED ORDER — DM-GUAIFENESIN ER 30-600 MG PO TB12
1.0000 | ORAL_TABLET | Freq: Two times a day (BID) | ORAL | Status: DC
  Administered 2020-08-09 – 2020-08-11 (×5): 1 via ORAL
  Filled 2020-08-09 (×5): qty 1

## 2020-08-09 MED ORDER — ONDANSETRON HCL 4 MG/2ML IJ SOLN: 4.0000 mg | Freq: Four times a day (QID) | INTRAMUSCULAR | Status: DC | PRN

## 2020-08-09 MED ORDER — AMLODIPINE BESYLATE 10 MG PO TABS
10.0000 mg | ORAL_TABLET | Freq: Every day | ORAL | Status: DC
  Administered 2020-08-09 – 2020-08-11 (×3): 10 mg via ORAL
  Filled 2020-08-09 (×3): qty 1

## 2020-08-09 MED ORDER — ACETAMINOPHEN 325 MG PO TABS
650.0000 mg | ORAL_TABLET | Freq: Four times a day (QID) | ORAL | Status: DC | PRN
  Administered 2020-08-10: 650 mg via ORAL
  Filled 2020-08-09: qty 2

## 2020-08-09 NOTE — H&P (View-Only) (Signed)
GI Inpatient Consult Note  Reason for Consult: Epigastric abdominal pain, hematemesis    Attending Requesting Consult: Dr. Lorretta Harp, MD  History of Present Illness: Devin Chan is a 55 y.o. male seen for evaluation of epigastric abdominal pain and hematemesis at the request of admitting hospitalist - Dr. Lorretta Harp. Pt has a PMH of asthma, depression, osteoarthritis, hyperemesis cannabinoid syndrome, HTN, CKD Stage IIIa, and tobacco abuse. He presented to the [facility] ED this morning for chief complaint of acute epigastric abdominal pain, nausea, and complaints of coffee-ground emesis. He rates pain as 10/10 in severity and localized in epigastrium with some radiation up to his mid-sternum. He describes the pain as burning in nature and "like my stomach is in knots." He reports coffee-ground emesis started yesterday morning and estimates about 5 times over the past 24 hours. Last episode of coffee-ground emesis was about 4 AM this morning. He has seen some bright red blood streaks in his emesis too so he got really concerned and decided to come to the ED. Upon presentation to the ED, hemoglobin was stable at 17.0. He was found to have acute kidney injury was creaitine 3.98, BUN 40 along with mild dehydration with sodium 130. He did have leukocytosis 29.3K. Urine drug screen pending. Blood cultures pending. He was also found to be fecal occult blood positive with dark stool. He was given Ceftriaxone 1 gram IV, IV fluids, IV antiemetics, pain control, and started on Protonix. GI was consulted in context of possible upper GI bleed.   Patient seen and examined this afternoon resting comfortably in hospital bed. He reports symptoms are consistent with his previous ED/hospital admissions with epigastric abdominal pain and coffee-ground emesis. He reports a history of ulcer previously. Last hospitalization 03/2019 for similar presentation where he had EGD performed by Dr. Allegra Lai showing reflux esophagitis,  gastritis, and normal duodenum. No ulcer, varices, or masses were seen. He was advised to take Protonix 40 mg daily and quit drinking alcohol. Previous ED visits for similar symptoms over the past 5 years have been attributed to Mallory-Weiss tear and alcoholic gastritis. 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 He stopped his Protonix about 1 year ago. He is not currently taking any antisecretory therapy. He does use marijuana daily. He reports last time he used marijuana was 2 days ago. He smokes 1 pack per day of tobacco. He reports marijuana relaxes him. He has intermittent bouts of nausea and vomiting which usually subside if he gets in a hot bath. He denies any frequent NSAID use.     Summary of GI Procedures:  EGD 04/05/2019 (Dr. Allegra Lai) -  - Reflux esophagitis, negative for intestinal metaplasia or malignancy - Reactive gastropathy. No ulcers or masses. H pylori negative.  - Normal duodenum.    Colonoscopy - none prior   Past Medical History:  Past Medical History:  Diagnosis Date   Arthritis    Asthma    Depression     Problem List: Patient Active Problem List   Diagnosis Date Noted   SIRS (systemic inflammatory response syndrome) (HCC) 08/09/2020   Asthma 08/09/2020   Gastritis 08/09/2020   Hypercalcemia 08/09/2020   Acute superficial gastritis without hemorrhage    Barrett's esophagus determined by endoscopy    Intractable vomiting    Elevated LFTs    Essential hypertension    Leukocytosis    Elevated total protein    Cannabinoid hyperemesis syndrome 04/02/2019   Acute renal failure superimposed on stage 3a  chronic kidney disease (HCC)    High anion gap metabolic acidosis    Dehydration    Marijuana abuse    Upper GI bleed 01/02/2014   Hematemesis 01/02/2014   Epigastric pain     Past Surgical History: Past Surgical History:  Procedure Laterality Date   CLEFT PALATE REPAIR  age 48   [facility]   ESOPHAGOGASTRODUODENOSCOPY (EGD) WITH PROPOFOL N/A 04/05/2019    Procedure: ESOPHAGOGASTRODUODENOSCOPY (EGD) WITH PROPOFOL;  Surgeon: Toney Reil, MD;  Location: [facility] ENDOSCOPY;  Service: Gastroenterology;  Laterality: N/A;   NO PAST SURGERIES      Allergies: Allergies  Allergen Reactions   Morphine And Related Other (See Comments)    Morphine doesn't work but hydromorphone does    Home Medications: (Not in a hospital admission)  Home medication reconciliation was completed with the patient.   Scheduled Inpatient Medications:    dextromethorphan-guaiFENesin  1 tablet Oral BID   nicotine  21 mg Transdermal Daily    Continuous Inpatient Infusions:    sodium chloride 100 mL/hr at 08/09/20 1039   pantoprazole 8 mg/hr (08/09/20 1058)    PRN Inpatient Medications:  acetaminophen, albuterol, hydrALAZINE, HYDROmorphone (DILAUDID) injection, ondansetron (ZOFRAN) IV  Family History: family history includes Drug abuse in his father; Hypertension in his father; Stroke in his father.  The patient's family history is negative for inflammatory bowel disorders, GI malignancy, or solid organ transplantation.  Social History:   reports that he has been smoking cigarettes. He has a 30.00 pack-year smoking history. He has never used smokeless tobacco. He reports current alcohol use of about 27.0 standard drinks of alcohol per week. He reports current drug use. Drug: Marijuana. The patient denies ETOH, tobacco, or drug use.   Review of Systems: Constitutional: Weight is stable.  Eyes: No changes in vision. ENT: No oral lesions, sore throat.  GI: see HPI.  Heme/Lymph: No easy bruising.  CV: No chest pain.  GU: No hematuria.  Integumentary: No rashes.  Neuro: No headaches.  Psych: No depression/anxiety.  Endocrine: No heat/cold intolerance.  Allergic/Immunologic: No urticaria.  Resp: No cough, SOB.  Musculoskeletal: No joint swelling.    Physical Examination: BP (!) 190/115   Pulse 84   Temp 98.3 F (36.8 C) (Oral)   Resp 18   SpO2 97%   Gen: NAD, alert and oriented x 4 HEENT: PEERLA, EOMI, Neck: supple, no JVD or thyromegaly Chest: CTA bilaterally, no wheezes, crackles, or other adventitious sounds CV: RRR, no m/g/c/r Abd: soft, NT, ND, +BS in all four quadrants; no HSM, guarding, ridigity, or rebound tenderness Ext: no edema, well perfused with 2+ pulses, Skin: no rash or lesions noted Lymph: no LAD  Data: Lab Results  Component Value Date   WBC 29.3 (H) 08/09/2020   HGB 17.0 08/09/2020   HCT 49.4 08/09/2020   MCV 86.7 08/09/2020   PLT 441 (H) 08/09/2020   Recent Labs  Lab 08/09/20 0527  HGB 17.0   Lab Results  Component Value Date   NA 130 (L) 08/09/2020   K 4.1 08/09/2020   CL 96 (L) 08/09/2020   CO2 22 08/09/2020   BUN 40 (H) 08/09/2020   CREATININE 3.98 (H) 08/09/2020   Lab Results  Component Value Date   ALT 19 08/09/2020   AST 33 08/09/2020   ALKPHOS 64 08/09/2020   BILITOT 1.5 (H) 08/09/2020   Recent Labs  Lab 08/09/20 0529  INR 1.1   CT Abdomen Pelvis Wo Contrast   Result Date: 08/09/2020 CLINICAL DATA:  55 year old with abdominal pain.  Vomiting. EXAM: CT ABDOMEN AND PELVIS WITHOUT CONTRAST TECHNIQUE: Multidetector CT imaging of the abdomen and pelvis was performed following the standard protocol without IV contrast. COMPARISON:  04/02/2019 and 612/08/2017 FINDINGS: Lower chest: 3 mm nodular density in the left lower lobe on sequence 3, image 5 is unchanged since 2021 and most compatible with a benign finding. Otherwise, the lung bases are clear. Hepatobiliary: Normal appearance of the gallbladder and liver. Pancreas: Unremarkable. No pancreatic ductal dilatation or surrounding inflammatory changes. Spleen: Normal in size without focal abnormality. Adrenals/Urinary Tract: Normal adrenal glands. Again noted is a slightly exophytic low-density structure along the posterior left kidney lower pole. Hounsfield units are 22 but this has not significantly changed over the prior examinations and  suspect this represents a mildly complex left renal cyst. Negative for kidney stones or hydronephrosis. Urinary bladder is slightly distended. Stomach/Bowel: Normal appearance of stomach. No evidence for bowel dilatation or focal bowel inflammation. Vascular/Lymphatic: Atherosclerotic calcifications throughout the abdomen and pelvis. Aortic atherosclerotic calcifications without aneurysm. No lymph node enlargement in the abdomen or pelvis. Reproductive: Prostate is unremarkable Other: Low-density structures in the inguinal canals bilaterally. These were not present on the prior examinations and could represent retracted testicles. Negative for ascites. Negative for free air. 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 No acute bone abnormality. IMPRESSION: 1. No acute abnormality in the abdomen. 2. Suspect the testicles are retracted into the inguinal canals bilaterally. Recommend clinical correlation in this area and this could be further characterized with ultrasound. 3.  Aortic Atherosclerosis (ICD10-I70.0). 4. Mildly complex left renal cyst as described. Electronically Signed   By: Richarda Overlie M.D.   On: 08/09/2020 08:34      Assessment/Plan:  55 y/o Caucasian male with a PMH of asthma, depression, osteoarthritis, hyperemesis cannabinoid syndrome, HTN, CKD Stage IIIa, and tobacco abuse presented to the [facility] ED this morning for chief complaint of acute epigastric abdominal pain with nausea and coffee-ground emesis  Acute epigastric abdominal pain Hematemesis/Coffee-ground emesis Positive FOBT Marijuana abuse - possible hyperemesis cannabinoid syndrome Acute on chronic kidney injury - creatinine 3.98, BUN 40 on admission    Clinical presentation and history are consistent with upper GI bleed. DDx includes peptic ulcer disease, gastritis +/- H pylori, erosive esophagitis, Mallory-Weiss tear, Dieulafoy's lesion, esophageal varices,  duodenitis, esophageal/gastric adenocarcinoma, or colon source.   He is currently hemodynamically stable (hemoglobin 17) with no evidence of active GI hemorrhage. He is appropriately on IV Protonix and pain is well-controlled currently.   I advise EGD for luminal evaluation and potential endoscopic hemostasis for further evaluation. Discussed procedure details and indications with patient and he is agreeable to proceed.   Remain NPO. Plan for EGD this afternoon with Dr. Norma Fredrickson.   Further recommendations after procedure  I reviewed the risks (including bleeding, perforation, infection, anesthesia complications, cardiac/respiratory complications), benefits and alternatives of EGD. Patient consents to proceed.    Thank you for the consult. Please call with questions or concerns.  Mickle Mallory [facility] [phone] (Cell)

## 2020-08-09 NOTE — ED Notes (Signed)
Patient aware that we need urine sample for testing, unable at this time. Pt given instruction on providing urine sample when able to do so.   

## 2020-08-09 NOTE — OR Nursing (Signed)
Bp elevated  181/122. Denies any sx. Dr Carole Binning notified and pt received 10mg  iv hydralazine. Report given to roman on 2c. Pt to be transported to 2c

## 2020-08-09 NOTE — Transfer of Care (Signed)
Immediate Anesthesia Transfer of Care Note  Patient: Devin Chan  Procedure(s) Performed: ESOPHAGOGASTRODUODENOSCOPY (EGD)  Patient Location: PACU  Anesthesia Type:General  Level of Consciousness: awake and alert   Airway & Oxygen Therapy: Patient Spontanous Breathing and Patient connected to nasal cannula oxygen  Post-op Assessment: Report given to RN and Post -op Vital signs reviewed and stable  Post vital signs: Reviewed and stable  Last Vitals:  Vitals Value Taken Time  BP 160/102 08/09/20 1645  Temp 36.1 C 08/09/20 1645  Pulse 75 08/09/20 1648  Resp 16 08/09/20 1648  SpO2 100 % 08/09/20 1648  Vitals shown include unvalidated device data.  Last Pain:  Vitals:   08/09/20 1528  TempSrc: Temporal  PainSc: 0-No pain         Complications: No notable events documented.

## 2020-08-09 NOTE — Anesthesia Postprocedure Evaluation (Signed)
Anesthesia Post Note  Patient: Devin Chan  Procedure(s) Performed: ESOPHAGOGASTRODUODENOSCOPY (EGD)  Patient location during evaluation: Endoscopy Anesthesia Type: General Level of consciousness: awake and alert Pain management: pain level controlled Vital Signs Assessment: post-procedure vital signs reviewed and stable Respiratory status: spontaneous breathing, nonlabored ventilation, respiratory function stable and patient connected to nasal cannula oxygen Cardiovascular status: blood pressure returned to baseline and stable Postop Assessment: no apparent nausea or vomiting Anesthetic complications: no   No notable events documented.   Last Vitals:  Vitals:   08/09/20 1645 08/09/20 1725  BP: (!) 160/102 (!) 160/89  Pulse:    Resp:    Temp: (!) 36.1 C   SpO2:      Last Pain:  Vitals:   08/09/20 1705  TempSrc:   PainSc: 0-No pain                 Cleda Mccreedy Labria Wos

## 2020-08-09 NOTE — ED Notes (Signed)
Moved via stretcher to CPOD room 32. Pt tolerated well. Denies any complaints, states he feels much better after dilaudid.

## 2020-08-09 NOTE — ED Notes (Signed)
Pt transported to CT ?

## 2020-08-09 NOTE — ED Triage Notes (Signed)
Abdominal pain since yesterday morning, vomiting "coffee grounds'. Anxious in triage.

## 2020-08-09 NOTE — Plan of Care (Signed)

## 2020-08-09 NOTE — ED Provider Notes (Signed)
[facility] Emergency Department Provider Note   ____________________________________________   Event Date/Time   First MD Initiated Contact with Patient 08/09/20 [phone]     (approximate)  I have reviewed the triage vital signs and the nursing notes.   HISTORY  Chief Complaint Abdominal Pain    HPI Devin Chan is a 55 y.o. male with the below stated past medical history presents for epigastric abdominal pain  LOCATION: Epigastric DURATION: 2 days TIMING: Worsening since onset SEVERITY: 10/10 QUALITY: Burning CONTEXT: Patient states this pain started first thing in the morning yesterday and has been worsening since onset. MODIFYING FACTORS: Denies any exacerbating or relieving factors ASSOCIATED SYMPTOMS: Nausea/vomiting.  Hematemesis.  Melena   Per medical record review patient has a history of bleeding gastric ulcers in the past as well as marijuana hyperemesis syndrome          Past Medical History:  Diagnosis Date   Arthritis    Asthma    Depression     Patient Active Problem List   Diagnosis Date Noted   Acute superficial gastritis without hemorrhage    Barrett's esophagus determined by endoscopy    Intractable vomiting    Elevated LFTs    Essential hypertension    Leukocytosis    Elevated total protein    Cannabinoid hyperemesis syndrome 04/02/2019   Acute kidney injury superimposed on CKD (HCC)    High anion gap metabolic acidosis    Dehydration    Marijuana abuse    Upper GI bleed 01/02/2014   Hematemesis 01/02/2014   Epigastric pain     Past Surgical History:  Procedure Laterality Date   CLEFT PALATE REPAIR  age 58   [facility]   ESOPHAGOGASTRODUODENOSCOPY (EGD) WITH PROPOFOL N/A 04/05/2019   Procedure: ESOPHAGOGASTRODUODENOSCOPY (EGD) WITH PROPOFOL;  Surgeon: Toney Reil, MD;  Location: [facility] ENDOSCOPY;  Service: Gastroenterology;  Laterality: N/A;   NO PAST SURGERIES      Prior to Admission medications    Medication Sig Start Date End Date Taking? Authorizing Provider  amLODipine (NORVASC) 10 MG tablet Take 1 tablet (10 mg total) by mouth daily. 04/05/19   Alford Highland, MD  ondansetron (ZOFRAN) 4 MG tablet Take 1 tablet (4 mg total) by mouth every 8 (eight) hours as needed for nausea or vomiting. 04/05/19   Alford Highland, MD  ondansetron (ZOFRAN) 4 MG tablet Take 1 tablet (4 mg total) by mouth daily as needed for nausea or vomiting. 04/10/19   Emily Filbert, MD  pantoprazole (PROTONIX) 40 MG tablet Take 1 tablet (40 mg total) by mouth every morning. 04/05/19   Alford Highland, MD  promethazine (PHENERGAN) 25 MG suppository Place 1 suppository (25 mg total) rectally every 6 (six) hours as needed for nausea. 04/10/19 04/09/20  Emily Filbert, MD    Allergies Patient has no known allergies.  Family History  Problem Relation Age of Onset   Drug abuse Father    Stroke Father    Hypertension Father     Social History Social History   Tobacco Use   Smoking status: Every Day    Packs/day: 1.00    Years: 30.00    Pack years: 30.00    Types: Cigarettes    Last attempt to quit: 05/01/2011    Years since quitting: 9.2   Smokeless tobacco: Never   Tobacco comments:    quit x 6 months at age 75  Substance Use Topics   Alcohol use: Yes    Alcohol/week: 27.0  standard drinks    Types: 15 Cans of beer, 12 Standard drinks or equivalent per week   Drug use: Yes    Types: Marijuana    Review of Systems Constitutional: No fever/chills Eyes: No visual changes. ENT: No sore throat. Cardiovascular: Denies chest pain. Respiratory: Denies shortness of breath. Gastrointestinal: Midepigastric abdominal pain.  Endorses nausea, vomiting and hematemesis.  No diarrhea. Genitourinary: Negative for dysuria. Musculoskeletal: Negative for acute arthralgias Skin: Negative for rash. Neurological: Negative for headaches, weakness/numbness/paresthesias in any extremity Psychiatric: Negative  for suicidal ideation/homicidal ideation   ____________________________________________   PHYSICAL EXAM:  VITAL SIGNS: ED Triage Vitals [08/09/20 0525]  Enc Vitals Group     BP (!) 154/113     Pulse Rate (!) 116     Resp 18     Temp 98.3 F (36.8 C)     Temp Source Oral     SpO2 99 %     Weight      Height      Head Circumference      Peak Flow      Pain Score 10     Pain Loc      Pain Edu?      Excl. in GC?    Constitutional: Alert and oriented. Well appearing and in no acute distress. Eyes: Conjunctivae are normal. PERRL. Head: Atraumatic. Nose: No congestion/rhinnorhea. Mouth/Throat: Mucous membranes are moist. Neck: No stridor Cardiovascular: Grossly normal heart sounds.  Good peripheral circulation. Respiratory: Normal respiratory effort.  No retractions. Gastrointestinal: Soft and nontender. No distention. Musculoskeletal: No obvious deformities Neurologic:  Normal speech and language. No gross focal neurologic deficits are appreciated. Skin:  Skin is warm and dry. No rash noted. Psychiatric: Mood and affect are normal. Speech and behavior are normal.  ____________________________________________   LABS (all labs ordered are listed, but only abnormal results are displayed)  Labs Reviewed  LIPASE, BLOOD - Abnormal; Notable for the following components:      Result Value   Lipase 58 (*)    All other components within normal limits  COMPREHENSIVE METABOLIC PANEL - Abnormal; Notable for the following components:   Sodium 130 (*)    Chloride 96 (*)    Glucose, Bld 160 (*)    BUN 40 (*)    Creatinine, Ser 3.98 (*)    Calcium 11.3 (*)    Total Protein 9.7 (*)    Albumin 5.7 (*)    Total Bilirubin 1.5 (*)    GFR, Estimated 17 (*)    All other components within normal limits  CBC - Abnormal; Notable for the following components:   WBC 29.3 (*)    Platelets 441 (*)    All other components within normal limits  RESP PANEL BY RT-PCR (FLU A&B, COVID) ARPGX2   ETHANOL  LACTIC ACID, PLASMA  URINALYSIS, COMPLETE (UACMP) WITH MICROSCOPIC  URINE DRUG SCREEN, QUALITATIVE ([facility] ONLY)  LACTIC ACID, PLASMA  PROTIME-INR  POC OCCULT BLOOD, ED  TYPE AND SCREEN   RADIOLOGY  ED MD interpretation: CT of the abdomen pelvis without contrast shows no acute abnormality in the abdomen  Official radiology report(s): CT Abdomen Pelvis Wo Contrast  Result Date: 08/09/2020 CLINICAL DATA:  55 year old with abdominal pain.  Vomiting. EXAM: CT ABDOMEN AND PELVIS WITHOUT CONTRAST TECHNIQUE: Multidetector CT imaging of the abdomen and pelvis was performed following the standard protocol without IV contrast. COMPARISON:  04/02/2019 and 612/08/2017 FINDINGS: Lower chest: 3 mm nodular density in the left lower lobe on sequence 3, image 5  is unchanged since 2021 and most compatible with a benign finding. Otherwise, the lung bases are clear. Hepatobiliary: Normal appearance of the gallbladder and liver. Pancreas: Unremarkable. No pancreatic ductal dilatation or surrounding inflammatory changes. Spleen: Normal in size without focal abnormality. Adrenals/Urinary Tract: Normal adrenal glands. Again noted is a slightly exophytic low-density structure along the posterior left kidney lower pole. Hounsfield units are 22 but this has not significantly changed over the prior examinations and suspect this represents a mildly complex left renal cyst. Negative for kidney stones or hydronephrosis. Urinary bladder is slightly distended. Stomach/Bowel: Normal appearance of stomach. No evidence for bowel dilatation or focal bowel inflammation. Vascular/Lymphatic: Atherosclerotic calcifications throughout the abdomen and pelvis. Aortic atherosclerotic calcifications without aneurysm. No lymph node enlargement in the abdomen or pelvis. Reproductive: Prostate is unremarkable Other: Low-density structures in the inguinal canals bilaterally. These were not present on the prior examinations and could  represent retracted testicles. Negative for ascites. Negative for free air. 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 No acute bone abnormality. IMPRESSION: 1. No acute abnormality in the abdomen. 2. Suspect the testicles are retracted into the inguinal canals bilaterally. Recommend clinical correlation in this area and this could be further characterized with ultrasound. 3.  Aortic Atherosclerosis (ICD10-I70.0). 4. Mildly complex left renal cyst as described. Electronically Signed   By: Richarda Overlie M.D.   On: 08/09/2020 08:34    ____________________________________________   PROCEDURES  Procedure(s) performed (including Critical Care):  .1-3 Lead EKG Interpretation  Date/Time: 08/09/2020 10:04 AM Performed by: Merwyn Katos, MD Authorized by: Merwyn Katos, MD     Interpretation: normal     ECG rate:  95   ECG rate assessment: normal     Rhythm: sinus rhythm     Ectopy: none     Conduction: normal   .Critical Care  Date/Time: 08/09/2020 10:04 AM Performed by: Merwyn Katos, MD Authorized by: Merwyn Katos, MD   Critical care provider statement:    Critical care time (minutes):  41   Critical care time was exclusive of:  Separately billable procedures and treating other patients   Critical care was necessary to treat or prevent imminent or life-threatening deterioration of the following conditions:  Dehydration and circulatory failure   Critical care was time spent personally by me on the following activities:  Discussions with consultants, evaluation of patient's response to treatment, examination of patient, ordering and performing treatments and interventions, ordering and review of laboratory studies, ordering and review of radiographic studies, pulse oximetry, re-evaluation of patient's condition, obtaining history from patient or surrogate and review of old charts   I assumed direction of critical  care for this patient from another provider in my specialty: no     Care discussed with: admitting provider     ____________________________________________   INITIAL IMPRESSION / ASSESSMENT AND PLAN / ED COURSE  As part of my medical decision making, I reviewed the following data within the electronic medical record, if available:  Nursing notes reviewed and incorporated, Labs reviewed, EKG interpreted, Old chart reviewed, Radiograph reviewed and Notes from prior ED visits reviewed and incorporated        + abdominal pain + mixed coffee ground hematemesis + black stool per rectum Given history and exam patients presentation most consistent with upper GI bleed possibly secondary to peptic ulcer disease or variceal bleeding. I have low suspicion for aortoenteric fistula, ENT bleeding mimic, [place], Pulmonary  bleeding mimic.  Workup: CBC, BMP, LFTs, Lipase, PT/INR, Type and Screen FOBT positive Interventions: Analgesia and antiemetic medications PRN Protonix 40mg  IVP No history of liver disease or varices and therefore will refrain from octreotide Ceftriaxone 1 gram IV PRBC transfusion  Findings: Hb: 17  Disposition: Admit for close monitoring.      ____________________________________________   FINAL CLINICAL IMPRESSION(S) / ED DIAGNOSES  Final diagnoses:  Epigastric pain  Non-intractable vomiting with nausea, unspecified vomiting type  Acute upper GI bleeding     ED Discharge Orders     None        Note:  This document was prepared using Dragon voice recognition software and may include unintentional dictation errors.    , MD 08/09/20 1004

## 2020-08-09 NOTE — ED Notes (Signed)
2 cultures drawn on left arm, only one on right arm due to difficult stick. Also, md made aware pt had already received first antibiotic upon culture draw.

## 2020-08-09 NOTE — ED Notes (Signed)
Lab reports blood bank tube hemolyzed; will cancel order and reorder if needed once evaluated by MD

## 2020-08-09 NOTE — Op Note (Signed)
[facility] Gastroenterology Patient Name: Devin Chan Procedure Date: 08/09/2020 4:23 PM MRN: [id number] Account #: [id number] Date of Birth: 11/06/1965 Admit Type: Outpatient Age: 55 Room: [facility] ENDO ROOM 4 Gender: Male Note Status: Finalized Procedure:             Upper GI endoscopy Indications:           Epigastric abdominal pain, Coffee-ground emesis,                         Nausea with vomiting Providers:             Boykin Nearing. Temperence Zenor MD, MD Medicines:             Propofol per Anesthesia Complications:         No immediate complications. Procedure:             Pre-Anesthesia Assessment:                        - The risks and benefits of the procedure and the                         sedation options and risks were discussed with the                         patient. All questions were answered and informed                         consent was obtained.                        - Patient identification and proposed procedure were                         verified prior to the procedure by the nurse. The                         procedure was verified in the procedure room.                        - ASA Grade Assessment: II - A patient with mild                         systemic disease.                        - After reviewing the risks and benefits, the patient                         was deemed in satisfactory condition to undergo the                         procedure.                        After obtaining informed consent, the endoscope was                         passed under direct vision. Throughout the procedure,  the patient's blood pressure, pulse, and oxygen                         saturations were monitored continuously. The Endoscope                         was introduced through the mouth, and advanced to the                         third part of duodenum. The upper GI endoscopy was                         accomplished without  difficulty. The patient tolerated                         the procedure well. Findings:      LA Grade B (one or more mucosal breaks greater than 5 mm, not extending       between the tops of two mucosal folds) esophagitis with no bleeding was       found in the distal esophagus.      Two tongues of salmon-colored mucosa were present from 35 to 37 cm.       Erosion was present from 35 to 38 cm. The maximum longitudinal extent of       these esophageal mucosal changes was 2 cm in length. Mucosa was biopsied       with a cold forceps for histology. One specimen bottle was sent to       pathology.      Diffuse mild inflammation characterized by congestion (edema) and       erythema was found in the entire examined stomach. Two biopsies were       obtained with cold forceps for Helicobacter pylori testing in the       gastric body.      A 2 cm hiatal hernia was present.      There is no endoscopic evidence of bleeding or ulceration in the entire       examined stomach.      Mild amount of coffee ground material without fresh blood was noted       throughout the stomach.      The examined duodenum was normal.      The exam was otherwise without abnormality. Impression:            - LA Grade B reflux esophagitis with no bleeding.                        - Salmon-colored mucosa suspicious for short-segment                         Barrett's esophagus. Biopsied.                        - Chronic gastritis.                        - 2 cm hiatal hernia.                        - Normal examined duodenum.                        -  The examination was otherwise normal.                        - Biopsies performed in the gastric body. Recommendation:        - Return patient to hospital ward for possible                         discharge same day.                        - Advance diet as tolerated.                        - Discontinue aspirin and NSAIDs.                        - Use Protonix  (pantoprazole) 40 mg PO daily.                        - Await pathology results.                        - Return to GI office in 1 month.                        - The findings and recommendations were discussed with                         the patient. Procedure Code(s):     --- Professional ---                        [phone], Esophagogastroduodenoscopy, flexible,                         transoral; with biopsy, single or multiple Diagnosis Code(s):     --- Professional ---                        R11.2, Nausea with vomiting, unspecified                        K92.0, Hematemesis                        R10.13, Epigastric pain                        K44.9, Diaphragmatic hernia without obstruction or                         gangrene                        K29.50, Unspecified chronic gastritis without bleeding                        K22.8, Other specified diseases of esophagus                        K21.00, Gastro-esophageal reflux disease with                         esophagitis, without  bleeding CPT copyright 2019 [employer]. All rights reserved. The codes documented in this report are preliminary and upon coder review may  be revised to meet current compliance requirements. Stanton Kidney MD, MD 08/09/2020 4:47:25 PM This report has been signed electronically. Number of Addenda: 0 Note Initiated On: 08/09/2020 4:23 PM Estimated Blood Loss:  Estimated blood loss: none.      [facility]

## 2020-08-09 NOTE — ED Notes (Signed)
Warm blanket given per pt request.

## 2020-08-09 NOTE — ED Notes (Signed)
Pts whole black wallet locked up, witnessed by April, Security, this Charity fundraiser and Lelon Mast, Consulting civil engineer $578.00 in cash in pt wallet. Key to wallet placed in ED pyxis.

## 2020-08-09 NOTE — Interval H&P Note (Signed)
History and Physical Interval Note:  08/09/2020 4:23 PM  Devin Chan  has presented today for surgery, with the diagnosis of Epigastric abdominal pain, hematemesis.  The various methods of treatment have been discussed with the patient and family. After consideration of risks, benefits and other options for treatment, the patient has consented to  Procedure(s): ESOPHAGOGASTRODUODENOSCOPY (EGD) (N/A) as a surgical intervention.  The patient's history has been reviewed, patient examined, no change in status, stable for surgery.  I have reviewed the patient's chart and labs.  Questions were answered to the patient's satisfaction.     [place], [place]

## 2020-08-09 NOTE — H&P (Signed)
History and Physical    Devin Chan ZOX:[id number] DOB: 1965/10/06 DOA: 08/09/2020  Referring MD/NP/PA:   PCP: [facility]   Patient coming from:  The patient is coming from home.  At baseline, pt is independent for most of ADL.        Chief Complaint: Hematemesis and epigastric abdominal pain  HPI: Devin Chan is a 55 y.o. male with medical history significant of UGIB, gastritis, Barrett's esophagitis, HTN, tobacco abuse, alcohol abuse in remission, asthma, CKD-IIIa, cannabinoid hyperemesis syndrome, who presents with hematemesis and epigastric abdominal pain.  Patient states that his abdominal pain started yesterday morning, which is located in epigastric area, constant, moderate, burning-like pain, sharp, nonradiating.  Associated with nausea and vomiting, no diarrhea.  Patient states that he has had several episodes of coffee-ground emesis.  He also has dark stool.  Denies chest pain, cough, shortness breath.  No fever or chills.  Patient states that he quit alcohol drinking 3 years ago.  ED Course: pt was found to have hemoglobin 17.0, WBC 29.3, lipase 58, lactic acid 1.7, negative COVID PCR, positive FOBT, alcohol level less than 10, worsening renal function,  liver function (ALP 54, AST 33, ALT 19, total bilirubin 1.5), temperature normal, blood pressure 154/113, tachycardia with heart rate 116, RR 18, oxygen saturation 98% on room air.  Patient is place in MedSurg bed for obs. Dr. Norma Fredrickson of GI is consulted.  CT-abd/pelvis: 1. No acute abnormality in the abdomen. 2. Suspect the testicles are retracted into the inguinal canals bilaterally. Recommend clinical correlation in this area and this could be further characterized with ultrasound. 3.  Aortic Atherosclerosis (ICD10-I70.0). 4. Mildly complex left renal cyst as described.    Review of Systems:   General: no fevers, chills, no body weight gain,  has fatigue HEENT: no blurry vision, hearing  changes or sore throat Respiratory: no dyspnea, coughing, wheezing CV: no chest pain, no palpitations GI: has nausea, vomiting, abdominal pain, hematemesis, dark stool, no diarrhea, constipation GU: no dysuria, burning on urination, increased urinary frequency, hematuria  Ext: no leg edema Neuro: no unilateral weakness, numbness, or tingling, no vision change or hearing loss Skin: no rash, no skin tear. MSK: No muscle spasm, no deformity, no limitation of range of movement in spin Heme: No easy bruising.  Travel history: No recent long distant travel.  Allergy:  Allergies  Allergen Reactions   Morphine And Related Other (See Comments)    Morphine doesn't work but hydromorphone does    Past Medical History:  Diagnosis Date   Arthritis    Asthma    Depression     Past Surgical History:  Procedure Laterality Date   CLEFT PALATE REPAIR  age 48   [facility]   ESOPHAGOGASTRODUODENOSCOPY (EGD) WITH PROPOFOL N/A 04/05/2019   Procedure: ESOPHAGOGASTRODUODENOSCOPY (EGD) WITH PROPOFOL;  Surgeon: Toney Reil, MD;  Location: [facility] ENDOSCOPY;  Service: Gastroenterology;  Laterality: N/A;   NO PAST SURGERIES      Social History:  reports that he has been smoking cigarettes. He has a 30.00 pack-year smoking history. He has never used smokeless tobacco. He reports current alcohol use of about 27.0 standard drinks of alcohol per week. He reports current drug use. Drug: Marijuana.  Family History:  Family History  Problem Relation Age of Onset   Drug abuse Father    Stroke Father    Hypertension Father      Prior to Admission medications   Medication Sig Start Date End Date Taking?  Authorizing Provider  amLODipine (NORVASC) 10 MG tablet Take 1 tablet (10 mg total) by mouth daily. 04/05/19   Alford Highland, MD  ondansetron (ZOFRAN) 4 MG tablet Take 1 tablet (4 mg total) by mouth every 8 (eight) hours as needed for nausea or vomiting. 04/05/19   Alford Highland, MD  ondansetron (ZOFRAN) 4  MG tablet Take 1 tablet (4 mg total) by mouth daily as needed for nausea or vomiting. 04/10/19   Emily Filbert, MD  pantoprazole (PROTONIX) 40 MG tablet Take 1 tablet (40 mg total) by mouth every morning. 04/05/19   Alford Highland, MD  promethazine (PHENERGAN) 25 MG suppository Place 1 suppository (25 mg total) rectally every 6 (six) hours as needed for nausea. 04/10/19 04/09/20  Emily Filbert, MD    Physical Exam: Vitals:   08/09/20 1015 08/09/20 1030 08/09/20 1045 08/09/20 1100  BP:  (!) 190/115    Pulse: 86 72 82 84  Resp:      Temp:      TempSrc:      SpO2: 97% 98% 96% 97%   General: Not in acute distress.  Dry mucous membrane HEENT:       Eyes: PERRL, EOMI, no scleral icterus.       ENT: No discharge from the ears and nose, no pharynx injection, no tonsillar enlargement.        Neck: No JVD, no bruit, no mass felt. Heme: No neck lymph node enlargement. Cardiac: S1/S2, RRR, No murmurs, No gallops or rubs. Respiratory: No rales, wheezing, rhonchi or rubs. GI: Soft, nondistended, has tenderness in epigastric area, no rebound pain, no organomegaly, BS present. GU: No hematuria Ext: No pitting leg edema bilaterally. 1+DP/PT pulse bilaterally. Musculoskeletal: No joint deformities, No joint redness or warmth, no limitation of ROM in spin. Skin: No rashes.  Neuro: Alert, oriented X3, cranial nerves II-XII grossly intact, moves all extremities normally.  Psych: Patient is not psychotic, no suicidal or hemocidal ideation.  Labs on Admission: I have personally reviewed following labs and imaging studies  CBC: Recent Labs  Lab 08/09/20 0527  WBC 29.3*  HGB 17.0  HCT 49.4  MCV 86.7  PLT 441*   Basic Metabolic Panel: Recent Labs  Lab 08/09/20 0527  NA 130*  K 4.1  CL 96*  CO2 22  GLUCOSE 160*  BUN 40*  CREATININE 3.98*  CALCIUM 11.3*   GFR: CrCl cannot be calculated (Unknown ideal weight.). Liver Function Tests: Recent Labs  Lab 08/09/20 0527  AST 33   ALT 19  ALKPHOS 64  BILITOT 1.5*  PROT 9.7*  ALBUMIN 5.7*   Recent Labs  Lab 08/09/20 0527  LIPASE 58*   No results for input(s): AMMONIA in the last 168 hours. Coagulation Profile: Recent Labs  Lab 08/09/20 0529  INR 1.1   Cardiac Enzymes: No results for input(s): CKTOTAL, CKMB, CKMBINDEX, TROPONINI in the last 168 hours. BNP (last 3 results) No results for input(s): PROBNP in the last 8760 hours. HbA1C: No results for input(s): HGBA1C in the last 72 hours. CBG: No results for input(s): GLUCAP in the last 168 hours. Lipid Profile: No results for input(s): CHOL, HDL, LDLCALC, TRIG, CHOLHDL, LDLDIRECT in the last 72 hours. Thyroid Function Tests: No results for input(s): TSH, T4TOTAL, FREET4, T3FREE, THYROIDAB in the last 72 hours. Anemia Panel: No results for input(s): VITAMINB12, FOLATE, FERRITIN, TIBC, IRON, RETICCTPCT in the last 72 hours. Urine analysis:    Component Value Date/Time   COLORURINE AMBER (A) 04/09/2019 2123  APPEARANCEUR HAZY (A) 04/09/2019 2123   LABSPEC 1.020 04/09/2019 2123   PHURINE 5.0 04/09/2019 2123   GLUCOSEU NEGATIVE 04/09/2019 2123   HGBUR NEGATIVE 04/09/2019 2123   BILIRUBINUR NEGATIVE 04/09/2019 2123   KETONESUR NEGATIVE 04/09/2019 2123   PROTEINUR 30 (A) 04/09/2019 2123   UROBILINOGEN 0.2 10/04/2009 1643   NITRITE NEGATIVE 04/09/2019 2123   LEUKOCYTESUR NEGATIVE 04/09/2019 2123   Sepsis Labs: @LABRCNTIP (procalcitonin:4,lacticidven:4) ) Recent Results (from the past 240 hour(s))  Resp Panel by RT-PCR (Flu A&B, Covid) Nasopharyngeal Swab     Status: None   Collection Time: 08/09/20 11:47 AM   Specimen: Nasopharyngeal Swab; Nasopharyngeal(NP) swabs in vial transport medium  Result Value Ref Range Status   SARS Coronavirus 2 by RT PCR NEGATIVE NEGATIVE Final    Comment: (NOTE) SARS-CoV-2 target nucleic acids are NOT DETECTED.  The SARS-CoV-2 RNA is generally detectable in upper respiratory specimens during the acute phase of  infection. The lowest concentration of SARS-CoV-2 viral copies this assay can detect is 138 copies/mL. A negative result does not preclude SARS-Cov-2 infection and should not be used as the sole basis for treatment or other patient management decisions. A negative result may occur with  improper specimen collection/handling, submission of specimen other than nasopharyngeal swab, presence of viral mutation(s) within the areas targeted by this assay, and inadequate number of viral copies(<138 copies/mL). A negative result must be combined with clinical observations, patient history, and epidemiological information. The expected result is Negative.  Fact Sheet for Patients:  BloggerCourse.comhttps://www.fda.gov/media/152166/download  Fact Sheet for Healthcare Providers:  SeriousBroker.ithttps://www.fda.gov/media/152162/download  This test is no t yet approved or cleared by the [place] FDA and  has been authorized for detection and/or diagnosis of SARS-CoV-2 by FDA under an Emergency Use Authorization (EUA). This EUA will remain  in effect (meaning this test can be used) for the duration of the COVID-19 declaration under Section 564(b)(1) of the Act, 21 U.S.C.section 360bbb-3(b)(1), unless the authorization is terminated  or revoked sooner.       Influenza A by PCR NEGATIVE NEGATIVE Final   Influenza B by PCR NEGATIVE NEGATIVE Final    Comment: (NOTE) The Xpert Xpress SARS-CoV-2/FLU/RSV plus assay is intended as an aid in the diagnosis of influenza from Nasopharyngeal swab specimens and should not be used as a sole basis for treatment. Nasal washings and aspirates are unacceptable for Xpert Xpress SARS-CoV-2/FLU/RSV testing.  Fact Sheet for Patients: BloggerCourse.comhttps://www.fda.gov/media/152166/download  Fact Sheet for Healthcare Providers: SeriousBroker.ithttps://www.fda.gov/media/152162/download  This test is not yet approved or cleared by the [place] FDA and has been authorized for detection and/or diagnosis of SARS-CoV-2  by FDA under an Emergency Use Authorization (EUA). This EUA will remain in effect (meaning this test can be used) for the duration of the COVID-19 declaration under Section 564(b)(1) of the Act, 21 U.S.C. section 360bbb-3(b)(1), unless the authorization is terminated or revoked.  Performed at [facility], [address] [place], [place] [address]      Radiological Exams on Admission: CT Abdomen Pelvis Wo Contrast  Result Date: 08/09/2020 CLINICAL DATA:  55 year old with abdominal pain.  Vomiting. EXAM: CT ABDOMEN AND PELVIS WITHOUT CONTRAST TECHNIQUE: Multidetector CT imaging of the abdomen and pelvis was performed following the standard protocol without IV contrast. COMPARISON:  04/02/2019 and 612/08/2017 FINDINGS: Lower chest: 3 mm nodular density in the left lower lobe on sequence 3, image 5 is unchanged since 2021 and most compatible with a benign finding. Otherwise, the lung bases are clear. Hepatobiliary: Normal appearance of the gallbladder  and liver. Pancreas: Unremarkable. No pancreatic ductal dilatation or surrounding inflammatory changes. Spleen: Normal in size without focal abnormality. Adrenals/Urinary Tract: Normal adrenal glands. Again noted is a slightly exophytic low-density structure along the posterior left kidney lower pole. Hounsfield units are 22 but this has not significantly changed over the prior examinations and suspect this represents a mildly complex left renal cyst. Negative for kidney stones or hydronephrosis. Urinary bladder is slightly distended. Stomach/Bowel: Normal appearance of stomach. No evidence for bowel dilatation or focal bowel inflammation. Vascular/Lymphatic: Atherosclerotic calcifications throughout the abdomen and pelvis. Aortic atherosclerotic calcifications without aneurysm. No lymph node enlargement in the abdomen or pelvis. Reproductive: Prostate is unremarkable Other: Low-density structures in the inguinal canals bilaterally. These were  not present on the prior examinations and could represent retracted testicles. Negative for ascites. Negative for free air. 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 No acute bone abnormality. IMPRESSION: 1. No acute abnormality in the abdomen. 2. Suspect the testicles are retracted into the inguinal canals bilaterally. Recommend clinical correlation in this area and this could be further characterized with ultrasound. 3.  Aortic Atherosclerosis (ICD10-I70.0). 4. Mildly complex left renal cyst as described. Electronically Signed   By: Richarda Overlie M.D.   On: 08/09/2020 08:34     EKG: Not done in ED, will get one.   Assessment/Plan Principal Problem:   Hematemesis Active Problems:   Epigastric pain   Acute renal failure superimposed on stage 3a chronic kidney disease (HCC)   Dehydration   Essential hypertension   Barrett's esophagus determined by endoscopy   SIRS (systemic inflammatory response syndrome) (HCC)   Asthma   Gastritis   Hypercalcemia   Hematemesis: Hgb 17.0.  Likely due to upper GI bleeding.  Patient has history of gastritis and Barrett's esophagitis.  He also reports history of gastric ulcer disease.  Hemodynamically stable.  Dr. Norma Fredrickson of GI is consulted  - will place in med-surg bed obs - NPO for possible EGD - IVF: 2L NS bolus, then at 100 mL/hr - Start IV pantoprazole gtt - Zofran IV for nausea - Avoid NSAIDs and SQ heparin - Maintain IV access (2 large bore IVs if possible). - Monitor closely and follow q6h cbc, transfuse as necessary, if Hgb<7.0 - LaB: INR, PTT and type screen   Epigastric pain: Likely due to history of gastritis.  Peptic ulcer disease is also possible.  CT abdomen/pelvis is not impressive for acute intra-abdominal issues.  Lipase 58. -Patient is on Protonix drip -As needed Dilaudid and Zofran  Acute renal failure superimposed on stage 3a chronic kidney disease (HCC):  Recent baseline creatinine 1.5 on 04/10/2019.  His creatinine is 3.98, BUN 40 today.  Likely due to dehydration and volume depletion. -IV fluid as above -Avoid using renal toxic medications. -Follow-up with BMP  Dehydration -IVF as above  Essential hypertension: Blood pressure is elevated 154/113, 184/125.  Patient is not taking his amlodipine -IV hydralazine as needed -Restart amlodipine at 10 mg daily  Barrett's esophagus determined by endoscopy and gastritis -on Protonix drip  SIRS (systemic inflammatory response syndrome) [facility]): Patient has WBC 29.3, tachycardia with heart rate 118,  meets criteria for for SIRS, no fever.  No source for infection identified. Will not treat as sepsis. Pt received 1 dose of Rocephin in ED, will hold off antibiotics since no source of infection identified. Lactic acid 1.7.  -will get Procalcitonin  -IVF: 2L of NS bolus in ED, followed  by 100 cc/h  -f/u UA and Bx  Asthma: stable -As needed albuterol inhaler and as needed Mucinex  Hypercalcemia: Cal 11.3.  Etiology is not clear.  Possibly due to dehydration. - check intact PTH, PTH related protein, Vd1, 25 -IVF as above          DVT ppx: SCD Code Status: Full code Family Communication: not done, no family member is at bed side.  Disposition Plan:  Anticipate discharge back to previous environment Consults called:  Dr. Norma Fredrickson of GI Admission status and Level of care: Med-Surg:    as inpt          Status is: Observation  The patient remains OBS appropriate and will d/c before 2 midnights.  Dispo: The patient is from: Home              Anticipated d/c is to: Home              Patient currently is not medically stable to d/c.   Difficult to place patient No            Date of Service 08/09/2020    Lorretta Harp [facility]   If 7PM-7AM, please contact night-coverage [email or web address] 08/09/2020, 1:34 PM

## 2020-08-09 NOTE — Anesthesia Preprocedure Evaluation (Signed)
Anesthesia Evaluation  Patient identified by MRN, date of birth, ID band Patient awake    Reviewed: Allergy & Precautions, NPO status , Patient's Chart, lab work & pertinent test results  History of Anesthesia Complications Negative for: history of anesthetic complications  Airway Mallampati: III  TM Distance: >3 FB Neck ROM: full    Dental  (+) Chipped, Poor Dentition, Missing, Implants   Pulmonary asthma , Current Smoker and Patient abstained from smoking.,    Pulmonary exam normal        Cardiovascular Exercise Tolerance: Good hypertension, (-) angina(-) Past MI Normal cardiovascular exam     Neuro/Psych PSYCHIATRIC DISORDERS negative neurological ROS  negative psych ROS   GI/Hepatic negative GI ROS, Neg liver ROS,   Endo/Other  negative endocrine ROS  Renal/GU Renal disease  negative genitourinary   Musculoskeletal  (+) Arthritis ,   Abdominal   Peds  Hematology  (+) Blood dyscrasia, anemia ,   Anesthesia Other Findings Past Medical History: No date: Arthritis No date: Asthma No date: Depression  Past Surgical History: age 78: CLEFT PALATE REPAIR     Comment:  [facility] 04/05/2019: ESOPHAGOGASTRODUODENOSCOPY (EGD) WITH PROPOFOL; N/A     Comment:  Procedure: ESOPHAGOGASTRODUODENOSCOPY (EGD) WITH               PROPOFOL;  Surgeon: Toney Reil, MD;  Location:               [facility] ENDOSCOPY;  Service: Gastroenterology;  Laterality:               N/A; No date: NO PAST SURGERIES  BMI    Body Mass Index: 23.40 kg/m      Reproductive/Obstetrics negative OB ROS                             Anesthesia Physical Anesthesia Plan  ASA: 4  Anesthesia Plan: General   Post-op Pain Management:    Induction: Intravenous  PONV Risk Score and Plan: Propofol infusion and TIVA  Airway Management Planned: Natural Airway and Nasal Cannula  Additional Equipment:   Intra-op Plan:    Post-operative Plan:   Informed Consent: I have reviewed the patients History and Physical, chart, labs and discussed the procedure including the risks, benefits and alternatives for the proposed anesthesia with the patient or authorized representative who has indicated his/her understanding and acceptance.     Dental Advisory Given  Plan Discussed with: Anesthesiologist, CRNA and Surgeon  Anesthesia Plan Comments: (Patient consented for risks of anesthesia including but not limited to:  - adverse reactions to medications - risk of airway placement if required - damage to eyes, teeth, lips or other oral mucosa - nerve damage due to positioning  - sore throat or hoarseness - Damage to heart, brain, nerves, lungs, other parts of body or loss of life  Patient voiced understanding.)        Anesthesia Quick Evaluation

## 2020-08-09 NOTE — Consult Note (Signed)
GI Inpatient Consult Note  Reason for Consult: Epigastric abdominal pain, hematemesis    Attending Requesting Consult: Dr. Lorretta Harp, MD  History of Present Illness: Devin Chan is a 55 y.o. male seen for evaluation of epigastric abdominal pain and hematemesis at the request of admitting hospitalist - Dr. Lorretta Harp. Pt has a PMH of asthma, depression, osteoarthritis, hyperemesis cannabinoid syndrome, HTN, CKD Stage IIIa, and tobacco abuse. He presented to the [facility] ED this morning for chief complaint of acute epigastric abdominal pain, nausea, and complaints of coffee-ground emesis. He rates pain as 10/10 in severity and localized in epigastrium with some radiation up to his mid-sternum. He describes the pain as burning in nature and "like my stomach is in knots." He reports coffee-ground emesis started yesterday morning and estimates about 5 times over the past 24 hours. Last episode of coffee-ground emesis was about 4 AM this morning. He has seen some bright red blood streaks in his emesis too so he got really concerned and decided to come to the ED. Upon presentation to the ED, hemoglobin was stable at 17.0. He was found to have acute kidney injury was creaitine 3.98, BUN 40 along with mild dehydration with sodium 130. He did have leukocytosis 29.3K. Urine drug screen pending. Blood cultures pending. He was also found to be fecal occult blood positive with dark stool. He was given Ceftriaxone 1 gram IV, IV fluids, IV antiemetics, pain control, and started on Protonix. GI was consulted in context of possible upper GI bleed.   Patient seen and examined this afternoon resting comfortably in hospital bed. He reports symptoms are consistent with his previous ED/hospital admissions with epigastric abdominal pain and coffee-ground emesis. He reports a history of ulcer previously. Last hospitalization 03/2019 for similar presentation where he had EGD performed by Dr. Allegra Lai showing reflux esophagitis,  gastritis, and normal duodenum. No ulcer, varices, or masses were seen. He was advised to take Protonix 40 mg daily and quit drinking alcohol. Previous ED visits for similar symptoms over the past 5 years have been attributed to Mallory-Weiss tear and alcoholic gastritis. 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 He stopped his Protonix about 1 year ago. He is not currently taking any antisecretory therapy. He does use marijuana daily. He reports last time he used marijuana was 2 days ago. He smokes 1 pack per day of tobacco. He reports marijuana relaxes him. He has intermittent bouts of nausea and vomiting which usually subside if he gets in a hot bath. He denies any frequent NSAID use.     Summary of GI Procedures:  EGD 04/05/2019 (Dr. Allegra Lai) -  - Reflux esophagitis, negative for intestinal metaplasia or malignancy - Reactive gastropathy. No ulcers or masses. H pylori negative.  - Normal duodenum.    Colonoscopy - none prior   Past Medical History:  Past Medical History:  Diagnosis Date   Arthritis    Asthma    Depression     Problem List: Patient Active Problem List   Diagnosis Date Noted   SIRS (systemic inflammatory response syndrome) (HCC) 08/09/2020   Asthma 08/09/2020   Gastritis 08/09/2020   Hypercalcemia 08/09/2020   Acute superficial gastritis without hemorrhage    Barrett's esophagus determined by endoscopy    Intractable vomiting    Elevated LFTs    Essential hypertension    Leukocytosis    Elevated total protein    Cannabinoid hyperemesis syndrome 04/02/2019   Acute renal failure superimposed on stage 3a  chronic kidney disease (HCC)    High anion gap metabolic acidosis    Dehydration    Marijuana abuse    Upper GI bleed 01/02/2014   Hematemesis 01/02/2014   Epigastric pain     Past Surgical History: Past Surgical History:  Procedure Laterality Date   CLEFT PALATE REPAIR  age 48   [facility]   ESOPHAGOGASTRODUODENOSCOPY (EGD) WITH PROPOFOL N/A 04/05/2019    Procedure: ESOPHAGOGASTRODUODENOSCOPY (EGD) WITH PROPOFOL;  Surgeon: Toney Reil, MD;  Location: [facility] ENDOSCOPY;  Service: Gastroenterology;  Laterality: N/A;   NO PAST SURGERIES      Allergies: Allergies  Allergen Reactions   Morphine And Related Other (See Comments)    Morphine doesn't work but hydromorphone does    Home Medications: (Not in a hospital admission)  Home medication reconciliation was completed with the patient.   Scheduled Inpatient Medications:    dextromethorphan-guaiFENesin  1 tablet Oral BID   nicotine  21 mg Transdermal Daily    Continuous Inpatient Infusions:    sodium chloride 100 mL/hr at 08/09/20 1039   pantoprazole 8 mg/hr (08/09/20 1058)    PRN Inpatient Medications:  acetaminophen, albuterol, hydrALAZINE, HYDROmorphone (DILAUDID) injection, ondansetron (ZOFRAN) IV  Family History: family history includes Drug abuse in his father; Hypertension in his father; Stroke in his father.  The patient's family history is negative for inflammatory bowel disorders, GI malignancy, or solid organ transplantation.  Social History:   reports that he has been smoking cigarettes. He has a 30.00 pack-year smoking history. He has never used smokeless tobacco. He reports current alcohol use of about 27.0 standard drinks of alcohol per week. He reports current drug use. Drug: Marijuana. The patient denies ETOH, tobacco, or drug use.   Review of Systems: Constitutional: Weight is stable.  Eyes: No changes in vision. ENT: No oral lesions, sore throat.  GI: see HPI.  Heme/Lymph: No easy bruising.  CV: No chest pain.  GU: No hematuria.  Integumentary: No rashes.  Neuro: No headaches.  Psych: No depression/anxiety.  Endocrine: No heat/cold intolerance.  Allergic/Immunologic: No urticaria.  Resp: No cough, SOB.  Musculoskeletal: No joint swelling.    Physical Examination: BP (!) 190/115   Pulse 84   Temp 98.3 F (36.8 C) (Oral)   Resp 18   SpO2 97%   Gen: NAD, alert and oriented x 4 HEENT: PEERLA, EOMI, Neck: supple, no JVD or thyromegaly Chest: CTA bilaterally, no wheezes, crackles, or other adventitious sounds CV: RRR, no m/g/c/r Abd: soft, NT, ND, +BS in all four quadrants; no HSM, guarding, ridigity, or rebound tenderness Ext: no edema, well perfused with 2+ pulses, Skin: no rash or lesions noted Lymph: no LAD  Data: Lab Results  Component Value Date   WBC 29.3 (H) 08/09/2020   HGB 17.0 08/09/2020   HCT 49.4 08/09/2020   MCV 86.7 08/09/2020   PLT 441 (H) 08/09/2020   Recent Labs  Lab 08/09/20 0527  HGB 17.0   Lab Results  Component Value Date   NA 130 (L) 08/09/2020   K 4.1 08/09/2020   CL 96 (L) 08/09/2020   CO2 22 08/09/2020   BUN 40 (H) 08/09/2020   CREATININE 3.98 (H) 08/09/2020   Lab Results  Component Value Date   ALT 19 08/09/2020   AST 33 08/09/2020   ALKPHOS 64 08/09/2020   BILITOT 1.5 (H) 08/09/2020   Recent Labs  Lab 08/09/20 0529  INR 1.1   CT Abdomen Pelvis Wo Contrast   Result Date: 08/09/2020 CLINICAL DATA:  55 year old with abdominal pain.  Vomiting. EXAM: CT ABDOMEN AND PELVIS WITHOUT CONTRAST TECHNIQUE: Multidetector CT imaging of the abdomen and pelvis was performed following the standard protocol without IV contrast. COMPARISON:  04/02/2019 and 612/08/2017 FINDINGS: Lower chest: 3 mm nodular density in the left lower lobe on sequence 3, image 5 is unchanged since 2021 and most compatible with a benign finding. Otherwise, the lung bases are clear. Hepatobiliary: Normal appearance of the gallbladder and liver. Pancreas: Unremarkable. No pancreatic ductal dilatation or surrounding inflammatory changes. Spleen: Normal in size without focal abnormality. Adrenals/Urinary Tract: Normal adrenal glands. Again noted is a slightly exophytic low-density structure along the posterior left kidney lower pole. Hounsfield units are 22 but this has not significantly changed over the prior examinations and  suspect this represents a mildly complex left renal cyst. Negative for kidney stones or hydronephrosis. Urinary bladder is slightly distended. Stomach/Bowel: Normal appearance of stomach. No evidence for bowel dilatation or focal bowel inflammation. Vascular/Lymphatic: Atherosclerotic calcifications throughout the abdomen and pelvis. Aortic atherosclerotic calcifications without aneurysm. No lymph node enlargement in the abdomen or pelvis. Reproductive: Prostate is unremarkable Other: Low-density structures in the inguinal canals bilaterally. These were not present on the prior examinations and could represent retracted testicles. Negative for ascites. Negative for free air. 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 No acute bone abnormality. IMPRESSION: 1. No acute abnormality in the abdomen. 2. Suspect the testicles are retracted into the inguinal canals bilaterally. Recommend clinical correlation in this area and this could be further characterized with ultrasound. 3.  Aortic Atherosclerosis (ICD10-I70.0). 4. Mildly complex left renal cyst as described. Electronically Signed   By: Richarda Overlie M.D.   On: 08/09/2020 08:34      Assessment/Plan:  55 y/o Caucasian male with a PMH of asthma, depression, osteoarthritis, hyperemesis cannabinoid syndrome, HTN, CKD Stage IIIa, and tobacco abuse presented to the [facility] ED this morning for chief complaint of acute epigastric abdominal pain with nausea and coffee-ground emesis  Acute epigastric abdominal pain Hematemesis/Coffee-ground emesis Positive FOBT Marijuana abuse - possible hyperemesis cannabinoid syndrome Acute on chronic kidney injury - creatinine 3.98, BUN 40 on admission    Clinical presentation and history are consistent with upper GI bleed. DDx includes peptic ulcer disease, gastritis +/- H pylori, erosive esophagitis, Mallory-Weiss tear, Dieulafoy's lesion, esophageal varices,  duodenitis, esophageal/gastric adenocarcinoma, or colon source.   He is currently hemodynamically stable (hemoglobin 17) with no evidence of active GI hemorrhage. He is appropriately on IV Protonix and pain is well-controlled currently.   I advise EGD for luminal evaluation and potential endoscopic hemostasis for further evaluation. Discussed procedure details and indications with patient and he is agreeable to proceed.   Remain NPO. Plan for EGD this afternoon with Dr. Norma Fredrickson.   Further recommendations after procedure  I reviewed the risks (including bleeding, perforation, infection, anesthesia complications, cardiac/respiratory complications), benefits and alternatives of EGD. Patient consents to proceed.    Thank you for the consult. Please call with questions or concerns.  Mickle Mallory [facility] [phone] (Cell)

## 2020-08-09 NOTE — ED Notes (Signed)
Pt placed in bed, states he is very dehydrated and needs fluids, has had this before and will continue to have abd cramping until he gets fluids. States "my blood pressure is high because I'm mad about waiting this long." NAD, call light in reach.

## 2020-08-10 ENCOUNTER — Encounter: Payer: Self-pay | Admitting: Internal Medicine

## 2020-08-10 DIAGNOSIS — K920 Hematemesis: Secondary | ICD-10-CM

## 2020-08-10 LAB — CBC
HCT: 43.4 % (ref 39.0–52.0)
HCT: 42.9 % (ref 39.0–52.0)
HCT: 43.3 % (ref 39.0–52.0)
HCT: 40.9 % (ref 39.0–52.0)
Hemoglobin: 15.1 g/dL (ref 13.0–17.0)
Hemoglobin: 15.3 g/dL (ref 13.0–17.0)
Hemoglobin: 15.2 g/dL (ref 13.0–17.0)
Hemoglobin: 14.2 g/dL (ref 13.0–17.0)
MCH: 30.1 pg (ref 26.0–34.0)
MCH: 31.2 pg (ref 26.0–34.0)
MCH: 30.4 pg (ref 26.0–34.0)
MCH: 30.7 pg (ref 26.0–34.0)
MCHC: 34.8 g/dL (ref 30.0–36.0)
MCHC: 35.7 g/dL (ref 30.0–36.0)
MCHC: 35.1 g/dL (ref 30.0–36.0)
MCHC: 34.7 g/dL (ref 30.0–36.0)
MCV: 86.6 fL (ref 80.0–100.0)
MCV: 87.4 fL (ref 80.0–100.0)
MCV: 86.6 fL (ref 80.0–100.0)
MCV: 88.3 fL (ref 80.0–100.0)
Platelets: 313 10*3/uL (ref 150–400)
Platelets: 303 10*3/uL (ref 150–400)
Platelets: 306 10*3/uL (ref 150–400)
Platelets: 291 10*3/uL (ref 150–400)
RBC: 5.01 MIL/uL (ref 4.22–5.81)
RBC: 4.91 MIL/uL (ref 4.22–5.81)
RBC: 5 MIL/uL (ref 4.22–5.81)
RBC: 4.63 MIL/uL (ref 4.22–5.81)
RDW: 13.9 % (ref 11.5–15.5)
RDW: 14 % (ref 11.5–15.5)
RDW: 13.9 % (ref 11.5–15.5)
RDW: 13.8 % (ref 11.5–15.5)
WBC: 18.4 10*3/uL — ABNORMAL HIGH (ref 4.0–10.5)
WBC: 18 10*3/uL — ABNORMAL HIGH (ref 4.0–10.5)
WBC: 17 10*3/uL — ABNORMAL HIGH (ref 4.0–10.5)
WBC: 14.5 10*3/uL — ABNORMAL HIGH (ref 4.0–10.5)
nRBC: 0 % (ref 0.0–0.2)
nRBC: 0 % (ref 0.0–0.2)
nRBC: 0 % (ref 0.0–0.2)
nRBC: 0 % (ref 0.0–0.2)

## 2020-08-10 LAB — BASIC METABOLIC PANEL
Anion gap: 5 (ref 5–15)
BUN: 20 mg/dL (ref 6–20)
CO2: 22 mmol/L (ref 22–32)
Calcium: 8.7 mg/dL — ABNORMAL LOW (ref 8.9–10.3)
Chloride: 106 mmol/L (ref 98–111)
Creatinine, Ser: 1.21 mg/dL (ref 0.61–1.24)
GFR, Estimated: >60 mL/min (ref >60)
Glucose, Bld: 106 mg/dL — ABNORMAL HIGH (ref 70–99)
Potassium: 4 mmol/L (ref 3.5–5.1)
Sodium: 133 mmol/L — ABNORMAL LOW (ref 135–145)

## 2020-08-10 LAB — APTT: aPTT: 27 seconds (ref 24–36)

## 2020-08-10 LAB — ALBUMIN: Albumin: 4.3 g/dL (ref 3.5–5.0)

## 2020-08-10 MED ORDER — PANTOPRAZOLE SODIUM 40 MG PO TBEC
40.0000 mg | DELAYED_RELEASE_TABLET | Freq: Every day | ORAL | Status: DC
  Administered 2020-08-10 – 2020-08-11 (×2): 40 mg via ORAL
  Filled 2020-08-10 (×2): qty 1

## 2020-08-10 NOTE — Progress Notes (Signed)
PROGRESS NOTE    Devin Chan  OZH:[id number] DOB: [birth year] DOA: 08/09/2020 PCP: [facility]   Chief Complaint  Patient presents with   Abdominal Pain    Brief Narrative:  Devin Chan is Devin Chan 55 y.o. male with medical history significant of UGIB, gastritis, Barrett's esophagitis, HTN, tobacco abuse, alcohol abuse in remission, asthma, CKD-IIIa, cannabinoid hyperemesis syndrome, who presents with hematemesis and epigastric abdominal pain.   Patient states that his abdominal pain started yesterday morning, which is located in epigastric area, constant, moderate, burning-like pain, sharp, nonradiating.  Associated with nausea and vomiting, no diarrhea.  Patient states that he has had several episodes of coffee-ground emesis.  He also has dark stool.  Denies chest pain, cough, shortness breath.  No fever or chills.  Patient states that he quit alcohol drinking 3 years ago.   ED Course: pt was found to have hemoglobin 17.0, WBC 29.3, lipase 58, lactic acid 1.7, negative COVID PCR, positive FOBT, alcohol level less than 10, worsening renal function,  liver function (ALP 54, AST 33, ALT 19, total bilirubin 1.5), temperature normal, blood pressure 154/113, tachycardia with heart rate 116, RR 18, oxygen saturation 98% on room air.  Patient is place in MedSurg bed for obs. Dr. Norma Fredricksonoledo of GI is consulted.   CT-abd/pelvis: 1. No acute abnormality in the abdomen. 2. Suspect the testicles are retracted into the inguinal canals bilaterally. Recommend clinical correlation in this area and this could be further characterized with ultrasound. 3.  Aortic Atherosclerosis (ICD10-I70.0). 4. Mildly complex left renal cyst as described.   Assessment & Plan:   Principal Problem:   Hematemesis Active Problems:   Epigastric pain   Acute renal failure superimposed on stage 3a chronic kidney disease (HCC)   Dehydration   Essential hypertension   Barrett's esophagus determined by  endoscopy   SIRS (systemic inflammatory response syndrome) (HCC)   Asthma   Gastritis   Hypercalcemia  Hematemesis: Patient has history of gastritis and Barrett's esophagitis.  He also reports history of gastric ulcer disease.   -s/p EGD with GI showing grade b reflux esophagitis, gastritis, barrett's - biopsies pending (see report) - d/c ASA and NSAIDs, PPI daily   Epigastric pain: Likely due to history of gastritis.  CT abdomen/pelvis is not impressive for acute intra-abdominal issues.  Lipase 58. -EGD showing gastritis/esophagitis  - continue PPI - he's still c/o generalized discomfort and malaise, will continue to monitor inpatient on PPI for now   Poor Oral Intake - related to malaise from esophagitis/gastritis? Follow with conservative management  Acute renal failure superimposed on stage 3a chronic kidney disease (HCC):  - resolved with IVF   Dehydration - improved   Essential hypertension: -amlodipine at 10 mg daily   Barrett's esophagus determined by endoscopy and gastritis -PPI   SIRS (systemic inflammatory response syndrome) [facility](HCC): Patient has WBC 29.3, tachycardia with heart rate 118,  meets criteria for for SIRS, no fever at presentation. Improved with IVF Continue to monitor     Asthma: stable -As needed albuterol inhaler and as needed Mucinex   Hypercalcemia: improved today with IVF - follow pending labs below - check intact PTH, PTH related protein, Vd1, 25  Testes retracted into inguinal canals bilaterally on imaging Follow US  DVT prophylaxis: (SCD Code Status: full  Family Communication: none at bedside Disposition:   Status is: Inpatient  Remains inpatient appropriate because:Inpatient level of care appropriate due to severity of illness  Dispo: The patient is from: Home  Anticipated d/c is to: Home              Patient currently is not medically stable to d/c.   Difficult to place patient No       Consultants:   Gi  Procedures: ( none  Antimicrobials:  Anti-infectives (From admission, onward)    Start     Dose/Rate Route Frequency Ordered Stop   08/09/20 1000  cefTRIAXone (ROCEPHIN) 1 g in sodium chloride 0.9 % 100 mL IVPB        1 g 200 mL/hr over 30 Minutes Intravenous  Once 08/09/20 0952 08/09/20 1154          Subjective: Doesn't feel well, no appetite  Objective: Vitals:   08/09/20 2025 08/10/20 0557 08/10/20 0747 08/10/20 1509  BP: (!) 157/85 (!) 148/103 (!) 155/106 (!) 140/125  Pulse: 93 91 99 99  Resp: 19 18 15 17   Temp: 98.9 F (37.2 C) 98.1 F (36.7 C) 98.1 F (36.7 C) 98.6 F (37 C)  TempSrc: Oral Oral Oral   SpO2: 100% 98% 100% 99%  Weight:      Height:        Intake/Output Summary (Last 24 hours) at 08/10/2020 1856 Last data filed at 08/10/2020 1050 Gross per 24 hour  Intake 731.64 ml  Output --  Net 731.64 ml   Filed Weights   08/09/20 1528  Weight: 65.8 kg    Examination:  General exam: Appears calm and comfortable  Respiratory system: Clear to auscultation. Respiratory effort normal. Cardiovascular system: S1 & S2 heard, RRR.  Gastrointestinal system: Abdomen is nondistended, soft and nontender.  Central nervous system: Alert and oriented. No focal neurological deficits. Extremities: no LEE Skin: No rashes, lesions or ulcers Psychiatry: Judgement and insight appear normal. Mood & affect appropriate.     Data Reviewed: I have personally reviewed following labs and imaging studies  CBC: Recent Labs  Lab 08/09/20 1757 08/09/20 2219 08/10/20 0427 08/10/20 1016 08/10/20 1633  WBC 18.8* 19.6* 18.4* 18.0* 17.0*  HGB 14.8 14.5 15.1 15.3 15.2  HCT 42.5 41.1 43.4 42.9 43.3  MCV 89.1 86.5 86.6 87.4 86.6  PLT 316 292 313 303 306    Basic Metabolic Panel: Recent Labs  Lab 08/09/20 0527 08/10/20 0427  NA 130* 133*  K 4.1 4.0  CL 96* 106  CO2 22 22  GLUCOSE 160* 106*  BUN 40* 20  CREATININE 3.98* 1.21  CALCIUM 11.3* 8.7*     GFR: Estimated Creatinine Clearance: 62.2 mL/min (by C-G formula based on SCr of 1.21 mg/dL).  Liver Function Tests: Recent Labs  Lab 08/09/20 0527 08/10/20 1016  AST 33  --   ALT 19  --   ALKPHOS 64  --   BILITOT 1.5*  --   PROT 9.7*  --   ALBUMIN 5.7* 4.3    CBG: No results for input(s): GLUCAP in the last 168 hours.   Recent Results (from the past 240 hour(s))  Resp Panel by RT-PCR (Flu Alexine Pilant&B, Covid) Nasopharyngeal Swab     Status: None   Collection Time: 08/09/20 11:47 AM   Specimen: Nasopharyngeal Swab; Nasopharyngeal(NP) swabs in vial transport medium  Result Value Ref Range Status   SARS Coronavirus 2 by RT PCR NEGATIVE NEGATIVE Final    Comment: (NOTE) SARS-CoV-2 target nucleic acids are NOT DETECTED.  The SARS-CoV-2 RNA is generally detectable in upper respiratory specimens during the acute phase of infection. The lowest concentration of SARS-CoV-2 viral copies this assay can detect is 138  copies/mL. Iktan Aikman negative result does not preclude SARS-Cov-2 infection and should not be used as the sole basis for treatment or other patient management decisions. Axten Pascucci negative result may occur with  improper specimen collection/handling, submission of specimen other than nasopharyngeal swab, presence of viral mutation(s) within the areas targeted by this assay, and inadequate number of viral copies(<138 copies/mL). Sherita Decoste negative result must be combined with clinical observations, patient history, and epidemiological information. The expected result is Negative.  Fact Sheet for Patients:  BloggerCourse.com  Fact Sheet for Healthcare Providers:  SeriousBroker.it  This test is no t yet approved or cleared by the [place] FDA and  has been authorized for detection and/or diagnosis of SARS-CoV-2 by FDA under an Emergency Use Authorization (EUA). This EUA will remain  in effect (meaning this test can be used) for the duration  of the COVID-19 declaration under Section 564(b)(1) of the Act, 21 U.S.C.section 360bbb-3(b)(1), unless the authorization is terminated  or revoked sooner.       Influenza Dara Beidleman by PCR NEGATIVE NEGATIVE Final   Influenza B by PCR NEGATIVE NEGATIVE Final    Comment: (NOTE) The Xpert Xpress SARS-CoV-2/FLU/RSV plus assay is intended as an aid in the diagnosis of influenza from Nasopharyngeal swab specimens and should not be used as Teagan Heidrick sole basis for treatment. Nasal washings and aspirates are unacceptable for Xpert Xpress SARS-CoV-2/FLU/RSV testing.  Fact Sheet for Patients: BloggerCourse.com  Fact Sheet for Healthcare Providers: SeriousBroker.it  This test is not yet approved or cleared by the [place] FDA and has been authorized for detection and/or diagnosis of SARS-CoV-2 by FDA under an Emergency Use Authorization (EUA). This EUA will remain in effect (meaning this test can be used) for the duration of the COVID-19 declaration under Section 564(b)(1) of the Act, 21 U.S.C. section 360bbb-3(b)(1), unless the authorization is terminated or revoked.  Performed at [facility], [address] [place], [place] [address]   CULTURE, BLOOD (ROUTINE X 2) w Reflex to ID Panel     Status: None (Preliminary result)   Collection Time: 08/09/20 11:48 AM   Specimen: BLOOD  Result Value Ref Range Status   Specimen Description BLOOD LEFT ARM  Final   Special Requests   Final    BOTTLES DRAWN AEROBIC AND ANAEROBIC Blood Culture results may not be optimal due to an inadequate volume of blood received in culture bottles   Culture   Final    NO GROWTH < 24 HOURS Performed at [facility], [address], [place], [place] [address]    Report Status PENDING  Incomplete  CULTURE, BLOOD (ROUTINE X 2) w Reflex to ID Panel     Status: None (Preliminary result)   Collection Time: 08/09/20  5:57 PM   Specimen: BLOOD  Result  Value Ref Range Status   Specimen Description BLOOD LEFT ANTECUBITAL  Final   Special Requests   Final    BOTTLES DRAWN AEROBIC AND ANAEROBIC Blood Culture adequate volume   Culture   Final    NO GROWTH < 12 HOURS Performed at [facility], [address], [place], [place] [address]    Report Status PENDING  Incomplete         Radiology Studies: CT Abdomen Pelvis Wo Contrast  Result Date: 08/09/2020 CLINICAL DATA:  55 year old with abdominal pain.  Vomiting. EXAM: CT ABDOMEN AND PELVIS WITHOUT CONTRAST TECHNIQUE: Multidetector CT imaging of the abdomen and pelvis was performed following the standard protocol without IV contrast. COMPARISON:  04/02/2019 and  612/08/2017 FINDINGS: Lower chest: 3 mm nodular density in the left lower lobe on sequence 3, image 5 is unchanged since 2021 and most compatible with Chance Munter benign finding. Otherwise, the lung bases are clear. Hepatobiliary: Normal appearance of the gallbladder and liver. Pancreas: Unremarkable. No pancreatic ductal dilatation or surrounding inflammatory changes. Spleen: Normal in size without focal abnormality. Adrenals/Urinary Tract: Normal adrenal glands. Again noted is Ayako Tapanes slightly exophytic low-density structure along the posterior left kidney lower pole. Hounsfield units are 22 but this has not significantly changed over the prior examinations and suspect this represents Dianelly Ferran mildly complex left renal cyst. Negative for kidney stones or hydronephrosis. Urinary bladder is slightly distended. Stomach/Bowel: Normal appearance of stomach. No evidence for bowel dilatation or focal bowel inflammation. Vascular/Lymphatic: Atherosclerotic calcifications throughout the abdomen and pelvis. Aortic atherosclerotic calcifications without aneurysm. No lymph node enlargement in the abdomen or pelvis. Reproductive: Prostate is unremarkable Other: Low-density structures in the inguinal canals bilaterally. These were not present on the prior examinations  and could represent retracted testicles. Negative for ascites. Negative for free air. 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 No acute bone abnormality. IMPRESSION: 1. No acute abnormality in the abdomen. 2. Suspect the testicles are retracted into the inguinal canals bilaterally. Recommend clinical correlation in this area and this could be further characterized with ultrasound. 3.  Aortic Atherosclerosis (ICD10-I70.0). 4. Mildly complex left renal cyst as described. Electronically Signed   By: Richarda Overlie M.D.   On: 08/09/2020 08:34        Scheduled Meds:  amLODipine  10 mg Oral Daily   dextromethorphan-guaiFENesin  1 tablet Oral BID   nicotine  21 mg Transdermal Daily   pantoprazole  40 mg Oral Daily   Continuous Infusions:  sodium chloride 100 mL/hr at 08/10/20 1637   sodium chloride       LOS: 0 days    Time spent: over 30 min    Lacretia Nicks, MD [facility]   To contact the attending provider between 7A-7P or the covering provider during after hours 7P-7A, please log into the web site [email or web address] and access using universal [place] password for that web site. If you do not have the password, please call the hospital operator.  08/10/2020, 6:56 PM

## 2020-08-10 NOTE — Plan of Care (Signed)

## 2020-08-11 ENCOUNTER — Inpatient Hospital Stay: Payer: Self-pay

## 2020-08-11 LAB — COMPREHENSIVE METABOLIC PANEL
ALT: 21 U/L (ref 0–44)
AST: 42 U/L — ABNORMAL HIGH (ref 15–41)
Albumin: 3.9 g/dL (ref 3.5–5.0)
Alkaline Phosphatase: 44 U/L (ref 38–126)
Anion gap: 7 (ref 5–15)
BUN: 13 mg/dL (ref 6–20)
CO2: 22 mmol/L (ref 22–32)
Calcium: 8.7 mg/dL — ABNORMAL LOW (ref 8.9–10.3)
Chloride: 106 mmol/L (ref 98–111)
Creatinine, Ser: 1.02 mg/dL (ref 0.61–1.24)
GFR, Estimated: >60 mL/min (ref >60)
Glucose, Bld: 101 mg/dL — ABNORMAL HIGH (ref 70–99)
Potassium: 3.9 mmol/L (ref 3.5–5.1)
Sodium: 135 mmol/L (ref 135–145)
Total Bilirubin: 1.4 mg/dL — ABNORMAL HIGH (ref 0.3–1.2)
Total Protein: 7 g/dL (ref 6.5–8.1)

## 2020-08-11 LAB — PARATHYROID HORMONE, INTACT (NO CA): PTH: 43 pg/mL (ref 15–65)

## 2020-08-11 LAB — SURGICAL PATHOLOGY

## 2020-08-11 LAB — CBC WITH DIFFERENTIAL/PLATELET
Abs Immature Granulocytes: 0.06 10*3/uL (ref 0.00–0.07)
Basophils Absolute: 0.1 10*3/uL (ref 0.0–0.1)
Basophils Relative: 0 %
Eosinophils Absolute: 0 10*3/uL (ref 0.0–0.5)
Eosinophils Relative: 0 %
HCT: 41.3 % (ref 39.0–52.0)
Hemoglobin: 14.7 g/dL (ref 13.0–17.0)
Immature Granulocytes: 0 %
Lymphocytes Relative: 16 %
Lymphs Abs: 2.4 10*3/uL (ref 0.7–4.0)
MCH: 30.8 pg (ref 26.0–34.0)
MCHC: 35.6 g/dL (ref 30.0–36.0)
MCV: 86.6 fL (ref 80.0–100.0)
Monocytes Absolute: 1.2 10*3/uL — ABNORMAL HIGH (ref 0.1–1.0)
Monocytes Relative: 8 %
Neutro Abs: 11.1 10*3/uL — ABNORMAL HIGH (ref 1.7–7.7)
Neutrophils Relative %: 76 %
Platelets: 289 10*3/uL (ref 150–400)
RBC: 4.77 MIL/uL (ref 4.22–5.81)
RDW: 13.7 % (ref 11.5–15.5)
WBC: 14.8 10*3/uL — ABNORMAL HIGH (ref 4.0–10.5)
nRBC: 0 % (ref 0.0–0.2)

## 2020-08-11 LAB — MAGNESIUM: Magnesium: 2 mg/dL (ref 1.7–2.4)

## 2020-08-11 LAB — PHOSPHORUS: Phosphorus: 2.4 mg/dL — ABNORMAL LOW (ref 2.5–4.6)

## 2020-08-11 LAB — CALCITRIOL (1,25 DI-OH VIT D): Vit D, 1,25-Dihydroxy: 84.2 pg/mL — ABNORMAL HIGH (ref 24.8–81.5)

## 2020-08-11 MED ORDER — NICOTINE 21 MG/24HR TD PT24: 21.0000 mg | MEDICATED_PATCH | Freq: Every day | TRANSDERMAL | 0 refills | Status: DC

## 2020-08-11 MED ORDER — PANTOPRAZOLE SODIUM 40 MG PO TBEC: 40.0000 mg | DELAYED_RELEASE_TABLET | Freq: Every day | ORAL | 0 refills | Status: DC

## 2020-08-11 MED ORDER — TRAZODONE HCL 50 MG PO TABS: 25.0000 mg | ORAL_TABLET | Freq: Every evening | ORAL | Status: DC | PRN

## 2020-08-11 NOTE — Discharge Summary (Signed)
Physician Discharge Summary  Devin Chan EAV:[id number] DOB: [birth year] DOA: 08/09/2020  PCP: [facility]  Admit date: 08/09/2020 Discharge date: 08/11/2020  Time spent: 40 minutes  Recommendations for Outpatient Follow-up:  Follow outpatient CBC/CMP Follow with GI outpatient in about 1 month  Follow pending surgical pathology  Follow with GI for barret's esophagus Follow pending PTHrp, follow calcitriol Follow complex renal cyst outpatient    Discharge Diagnoses:  Principal Problem:   Hematemesis Active Problems:   Epigastric pain   Acute renal failure superimposed on stage 3a chronic kidney disease (HCC)   Dehydration   Essential hypertension   Barrett's esophagus determined by endoscopy   SIRS (systemic inflammatory response syndrome) (HCC)   Asthma   Gastritis   Hypercalcemia   Discharge Condition: stable  Diet recommendation: heart healthy  Filed Weights   08/09/20 1528  Weight: 65.8 kg    History of present illness:  Devin Chan is Devin Chan 55 y.o. male with medical history significant of UGIB, gastritis, Barrett's esophagitis, HTN, tobacco abuse, alcohol abuse in remission, asthma, CKD-IIIa, cannabinoid hyperemesis syndrome, who presents with hematemesis and epigastric abdominal pain.   Patient states that his abdominal pain started yesterday morning, which is located in epigastric area, constant, moderate, burning-like pain, sharp, nonradiating.  Associated with nausea and vomiting, no diarrhea.  Patient states that he has had several episodes of coffee-ground emesis.  He also has dark stool.  Denies chest pain, cough, shortness breath.  No fever or chills.  Patient states that he quit alcohol drinking 3 years ago.   ED Course: pt was found to have hemoglobin 17.0, WBC 29.3, lipase 58, lactic acid 1.7, negative COVID PCR, positive FOBT, alcohol level less than 10, worsening renal function,  liver function (ALP 54, AST 33, ALT 19, total  bilirubin 1.5), temperature normal, blood pressure 154/113, tachycardia with heart rate 116, RR 18, oxygen saturation 98% on room air.  Patient is place in MedSurg bed for obs. Dr. Norma Fredricksonoledo of GI is consulted.   CT-abd/pelvis: 1. No acute abnormality in the abdomen. 2. Suspect the testicles are retracted into the inguinal canals bilaterally. Recommend clinical correlation in this area and this could be further characterized with ultrasound. 3.  Aortic Atherosclerosis (ICD10-I70.0). 4. Mildly complex left renal cyst as described.   He was admitted with hematemesis and abdominal pain.  EGD showed esophagitis, gastritis, and barrett's esophagus.  On the day of discharge, he was feeling improved.  Discharged with recommendations for outpatient follow up.   Hospital Course:  Hematemesis: Patient has history of gastritis and Barrett's esophagitis.  He also reports history of gastric ulcer disease.   -s/p EGD with GI showing grade b reflux esophagitis, gastritis, barrett's - biopsies pending (see report) - d/c ASA and NSAIDs, PPI daily - follow biopsy results with GI outpatient    Epigastric pain: Likely due to history of gastritis.  CT abdomen/pelvis is not impressive for acute intra-abdominal issues.  Lipase 58. -EGD showing gastritis/esophagitis - continue PPI   Poor Oral Intake - improving, follow outpatient    Acute renal failure superimposed on stage 3a chronic kidney disease (HCC):  - resolved with IVF   Dehydration - improved   Essential hypertension: -amlodipine at 10 mg daily   Barrett's esophagus determined by endoscopy and gastritis -PPI - follow with GI outpatient    SIRS (systemic inflammatory response syndrome) [facility](HCC): Patient has WBC 29.3, tachycardia with heart rate 118,  meets criteria for for SIRS, no fever at presentation. Improved  with IVF Continue to monitor     Asthma: stable -As needed albuterol inhaler and as needed Mucinex   Hypercalcemia: improved today  with IVF - follow pending labs below - check intact PTH (wnl), pending PTH related protein, pending Vd1, 25   Testes retracted into inguinal canals bilaterally on imaging [place] without evdience of mass or torsion  Mildly complex L renal cyst Follow outpatient with PCP   Procedures: EGD - LA Grade B reflux esophagitis with no bleeding. - Salmon-colored mucosa suspicious for short-segment Barrett's esophagus. Biopsied. - Chronic gastritis. - 2 cm hiatal hernia. - Normal examined duodenum. - The examination was otherwise normal. - Biopsies performed in the gastric body. - Return patient to hospital ward for possible discharge same day. - Advance diet as tolerated. - Discontinue aspirin and NSAIDs. - Use Protonix (pantoprazole) 40 mg PO daily. - Await pathology results. - Return to GI office in 1 month. - The findings and recommendations were discussed with the patient. Consultations: GI  Discharge Exam: Vitals:   08/11/20 0900 08/11/20 1015  BP: (!) 187/100 (!) 144/93  Pulse: 81   Resp: 20   Temp: 98.9 F (37.2 C)   SpO2: 100%    Feels better Knows he has to work on stressing factors and follow up with doctors outpatient  General: No acute distress. Cardiovascular: Heart sounds show Devin Chan regular rate, and rhythm.  Lungs: Clear to auscultation bilaterally  Abdomen: Soft, nontender, nondistended  Neurological: Alert and oriented 3. Moves all extremities 4 . Cranial nerves II through XII grossly intact. Skin: Warm and dry. No rashes or lesions. Extremities: No clubbing or cyanosis. No edema.  Discharge Instructions   Discharge Instructions     Call MD for:  difficulty breathing, headache or visual disturbances   Complete by: As directed    Call MD for:  extreme fatigue   Complete by: As directed    Call MD for:  hives   Complete by: As directed    Call MD for:  persistant dizziness or light-headedness   Complete by: As directed    Call MD for:  persistant nausea and  vomiting   Complete by: As directed    Call MD for:  redness, tenderness, or signs of infection (pain, swelling, redness, odor or green/yellow discharge around incision site)   Complete by: As directed    Call MD for:  severe uncontrolled pain   Complete by: As directed    Call MD for:  temperature >100.4   Complete by: As directed    Diet - low sodium heart healthy   Complete by: As directed    Discharge instructions   Complete by: As directed    You were seen for hematemesis (throwing up blood).  You had an EGD that showed esophagitis (inflammation in your throat), gastritis (inflammation in your stomach), and barrett's esophagus.  Gastroenterology recommends avoiding aspirin and NSAIDS (ibuprofen, advil, naproxen, etc).  Take protonix 40 mg daily.  Please follow up your biopsy results with gastroenterology.   You have Luwanna Brossman cyst in your kidney, please follow up with your PCP regarding this.  You have Hosam Mcfetridge couple of pending studies that you should follow up with your PCP.  Return for new, recurrent, or worsening symptoms.    Please ask your PCP to request records from this hospitalization so they know what was done and what the next steps will be.   Increase activity slowly   Complete by: As directed  Allergies as of 08/11/2020       Reactions   Morphine And Related Other (See Comments)   Morphine doesn't work but hydromorphone does        Medication List     TAKE these medications    albuterol 108 (90 Base) MCG/ACT inhaler Commonly known as: VENTOLIN HFA Inhale 2 puffs into the lungs every 6 (six) hours as needed for wheezing or shortness of breath.   amLODipine 10 MG tablet Commonly known as: NORVASC Take 1 tablet (10 mg total) by mouth daily.   nicotine 21 mg/24hr patch Commonly known as: NICODERM CQ - dosed in mg/24 hours Place 1 patch (21 mg total) onto the skin daily. Start taking on: August 12, 2020   pantoprazole 40 MG tablet Commonly known as:  PROTONIX Take 1 tablet (40 mg total) by mouth daily. Start taking on: August 12, 2020 What changed: when to take this   promethazine 25 MG suppository Commonly known as: Phenergan Place 1 suppository (25 mg total) rectally every 6 (six) hours as needed for nausea.       ASK your doctor about these medications    ondansetron 4 MG tablet Commonly known as: Zofran Take 1 tablet (4 mg total) by mouth every 8 (eight) hours as needed for nausea or vomiting.   ondansetron 4 MG tablet Commonly known as: Zofran Take 1 tablet (4 mg total) by mouth daily as needed for nausea or vomiting.       Allergies  Allergen Reactions   Morphine And Related Other (See Comments)    Morphine doesn't work but hydromorphone does    Follow-up Information     [facility]. Schedule an appointment as soon as possible for Aryiana Klinkner visit.   Specialty: Family Medicine Why: Patient scheduled for 07/07 @ 12:30pm Contact information: [address] [place] [address] [phone]         Stanton Kidney, MD Follow up.   Specialty: Gastroenterology Why: Called Dr. Norma Fredrickson office - RN will call patient with appt Contact information: [address] [place] [address] [phone]                  The results of significant diagnostics from this hospitalization (including imaging, microbiology, ancillary and laboratory) are listed below for reference.    Significant Diagnostic Studies: CT Abdomen Pelvis Wo Contrast  Result Date: 08/09/2020 CLINICAL DATA:  55 year old with abdominal pain.  Vomiting. EXAM: CT ABDOMEN AND PELVIS WITHOUT CONTRAST TECHNIQUE: Multidetector CT imaging of the abdomen and pelvis was performed following the standard protocol without IV contrast. COMPARISON:  04/02/2019 and 612/08/2017 FINDINGS: Lower chest: 3 mm nodular density in the left lower lobe on sequence 3, image 5 is unchanged since 2021 and most compatible with Rolfe Hartsell benign finding.  Otherwise, the lung bases are clear. Hepatobiliary: Normal appearance of the gallbladder and liver. Pancreas: Unremarkable. No pancreatic ductal dilatation or surrounding inflammatory changes. Spleen: Normal in size without focal abnormality. Adrenals/Urinary Tract: Normal adrenal glands. Again noted is Jakobee Brackins slightly exophytic low-density structure along the posterior left kidney lower pole. Hounsfield units are 22 but this has not significantly changed over the prior examinations and suspect this represents Maxwel Meadowcroft mildly complex left renal cyst. Negative for kidney stones or hydronephrosis. Urinary bladder is slightly distended. Stomach/Bowel: Normal appearance of stomach. No evidence for bowel dilatation or focal bowel inflammation. Vascular/Lymphatic: Atherosclerotic calcifications throughout the abdomen and pelvis. Aortic atherosclerotic calcifications without aneurysm. No lymph node enlargement in the abdomen  or pelvis. Reproductive: Prostate is unremarkable Other: Low-density structures in the inguinal canals bilaterally. These were not present on the prior examinations and could represent retracted testicles. Negative for ascites. Negative for free air. 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 No acute bone abnormality. IMPRESSION: 1. No acute abnormality in the abdomen. 2. Suspect the testicles are retracted into the inguinal canals bilaterally. Recommend clinical correlation in this area and this could be further characterized with ultrasound. 3.  Aortic Atherosclerosis (ICD10-I70.0). 4. Mildly complex left renal cyst as described. Electronically Signed   By: Richarda Overlie M.D.   On: 08/09/2020 08:34   US SCROTUM W/DOPPLER  Result Date: 08/11/2020 CLINICAL DATA:  Possible retracted testicles. EXAM: SCROTAL ULTRASOUND DOPPLER ULTRASOUND OF THE TESTICLES TECHNIQUE: Complete ultrasound examination of the testicles, epididymis, and other  scrotal structures was performed. Color and spectral Doppler ultrasound were also utilized to evaluate blood flow to the testicles. COMPARISON:  August 09, 2020. FINDINGS: Right testicle Measurements: 2.8 x 2.7 x 1.8 cm. No mass or microlithiasis visualized. Testicle is in the scrotum. Left testicle Measurements: 3.0 x 2.7 x 1.6 cm. No mass or microlithiasis visualized. Testicle is in the scrotum. Right epididymis:  Small cyst is noted. Left epididymis:  Small cysts are noted. Hydrocele:  Small bilateral hydroceles are noted. Varicocele:  None visualized. Pulsed Doppler interrogation of both testes demonstrates normal low resistance arterial and venous waveforms bilaterally. IMPRESSION: No evidence of testicular mass or torsion. Both testicles are noted to be in the scrotum. Small bilateral epididymal cysts are noted. Small bilateral hydroceles are noted. Electronically Signed   By: Lupita Raider M.D.   On: 08/11/2020 10:07    Microbiology: Recent Results (from the past 240 hour(s))  Resp Panel by RT-PCR (Flu Shyler Hamill&B, Covid) Nasopharyngeal Swab     Status: None   Collection Time: 08/09/20 11:47 AM   Specimen: Nasopharyngeal Swab; Nasopharyngeal(NP) swabs in vial transport medium  Result Value Ref Range Status   SARS Coronavirus 2 by RT PCR NEGATIVE NEGATIVE Final    Comment: (NOTE) SARS-CoV-2 target nucleic acids are NOT DETECTED.  The SARS-CoV-2 RNA is generally detectable in upper respiratory specimens during the acute phase of infection. The lowest concentration of SARS-CoV-2 viral copies this assay can detect is 138 copies/mL. Vicki Pasqual negative result does not preclude SARS-Cov-2 infection and should not be used as the sole basis for treatment or other patient management decisions. Jona Erkkila negative result may occur with  improper specimen collection/handling, submission of specimen other than nasopharyngeal swab, presence of viral mutation(s) within the areas targeted by this assay, and inadequate number of  viral copies(<138 copies/mL). Cheng Dec negative result must be combined with clinical observations, patient history, and epidemiological information. The expected result is Negative.  Fact Sheet for Patients:  BloggerCourse.com  Fact Sheet for Healthcare Providers:  SeriousBroker.it  This test is no t yet approved or cleared by the [place] FDA and  has been authorized for detection and/or diagnosis of SARS-CoV-2 by FDA under an Emergency Use Authorization (EUA). This EUA will remain  in effect (meaning this test can be used) for the duration of the COVID-19 declaration under Section 564(b)(1) of the Act, 21 U.S.C.section 360bbb-3(b)(1), unless the authorization is terminated  or revoked sooner.       Influenza Davidjames Blansett by PCR NEGATIVE NEGATIVE Final   Influenza B by PCR NEGATIVE NEGATIVE Final    Comment: (NOTE) The Xpert Xpress SARS-CoV-2/FLU/RSV plus assay  is intended as an aid in the diagnosis of influenza from Nasopharyngeal swab specimens and should not be used as Alexiss Iturralde sole basis for treatment. Nasal washings and aspirates are unacceptable for Xpert Xpress SARS-CoV-2/FLU/RSV testing.  Fact Sheet for Patients: BloggerCourse.com  Fact Sheet for Healthcare Providers: SeriousBroker.it  This test is not yet approved or cleared by the [place] FDA and has been authorized for detection and/or diagnosis of SARS-CoV-2 by FDA under an Emergency Use Authorization (EUA). This EUA will remain in effect (meaning this test can be used) for the duration of the COVID-19 declaration under Section 564(b)(1) of the Act, 21 U.S.C. section 360bbb-3(b)(1), unless the authorization is terminated or revoked.  Performed at [facility], [address] [place], [place] [address]   CULTURE, BLOOD (ROUTINE X 2) w Reflex to ID Panel     Status: None (Preliminary result)   Collection Time:  08/09/20 11:48 AM   Specimen: BLOOD  Result Value Ref Range Status   Specimen Description BLOOD LEFT ARM  Final   Special Requests   Final    BOTTLES DRAWN AEROBIC AND ANAEROBIC Blood Culture results may not be optimal due to an inadequate volume of blood received in culture bottles   Culture   Final    NO GROWTH 2 DAYS Performed at [facility], [address] [place], [place] [address]    Report Status PENDING  Incomplete  CULTURE, BLOOD (ROUTINE X 2) w Reflex to ID Panel     Status: None (Preliminary result)   Collection Time: 08/09/20  5:57 PM   Specimen: BLOOD  Result Value Ref Range Status   Specimen Description BLOOD LEFT ANTECUBITAL  Final   Special Requests   Final    BOTTLES DRAWN AEROBIC AND ANAEROBIC Blood Culture adequate volume   Culture   Final    NO GROWTH 2 DAYS Performed at [facility], [address] [place], [place] [address]    Report Status PENDING  Incomplete     Labs: Basic Metabolic Panel: Recent Labs  Lab 08/09/20 0527 08/10/20 0427 08/11/20 0523  NA 130* 133* 135  K 4.1 4.0 3.9  CL 96* 106 106  CO2 22 22 22   GLUCOSE 160* 106* 101*  BUN 40* 20 13  CREATININE 3.98* 1.21 1.02  CALCIUM 11.3* 8.7* 8.7*  MG  --   --  2.0  PHOS  --   --  2.4*   Liver Function Tests: Recent Labs  Lab 08/09/20 0527 08/10/20 1016 08/11/20 0523  AST 33  --  42*  ALT 19  --  21  ALKPHOS 64  --  44  BILITOT 1.5*  --  1.4*  PROT 9.7*  --  7.0  ALBUMIN 5.7* 4.3 3.9   Recent Labs  Lab 08/09/20 0527  LIPASE 58*   Recent Labs  Lab 08/09/20 1757  AMMONIA 20   CBC: Recent Labs  Lab 08/10/20 0427 08/10/20 1016 08/10/20 1633 08/10/20 2210 08/11/20 0523  WBC 18.4* 18.0* 17.0* 14.5* 14.8*  NEUTROABS  --   --   --   --  11.1*  HGB 15.1 15.3 15.2 14.2 14.7  HCT 43.4 42.9 43.3 40.9 41.3  MCV 86.6 87.4 86.6 88.3 86.6  PLT 313 303 306 291 289   Cardiac Enzymes: No results for input(s): CKTOTAL, CKMB, CKMBINDEX, TROPONINI in the  last 168 hours. BNP: BNP (last 3 results) No results for input(s): BNP in the last 8760 hours.  ProBNP (last 3 results) No  results for input(s): PROBNP in the last 8760 hours.  CBG: No results for input(s): GLUCAP in the last 168 hours.     Signed:  Lacretia Nicks MD.  [facility] 08/11/2020, 1:12 PM

## 2020-08-14 LAB — CULTURE, BLOOD (ROUTINE X 2)
Culture: NO GROWTH
Culture: NO GROWTH
Special Requests: ADEQUATE

## 2020-08-17 LAB — PTH-RELATED PEPTIDE: PTH-related peptide: <2 pmol/L

## 2021-10-07 IMAGING — US US SCROTUM W/ DOPPLER COMPLETE
1 series · 14 of 25 positions shown · non-contrast
Comparison: August 09, 2020.

CLINICAL DATA: Possible retracted testicles.

EXAM:
SCROTAL ULTRASOUND
DOPPLER ULTRASOUND OF THE TESTICLES
TECHNIQUE: Complete ultrasound examination of the testicles, epididymis, and
other scrotal structures was performed. Color and spectral Doppler
ultrasound were also utilized to evaluate blood flow to the
testicles.

[Series 1: us scrotum w/doppler · 65 acquisitions, 14 frames shown]
[im 1/65]
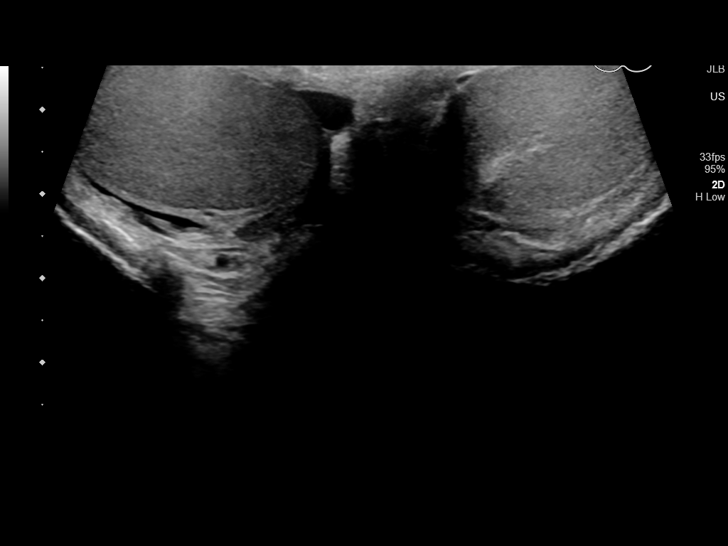
[im 6/65]
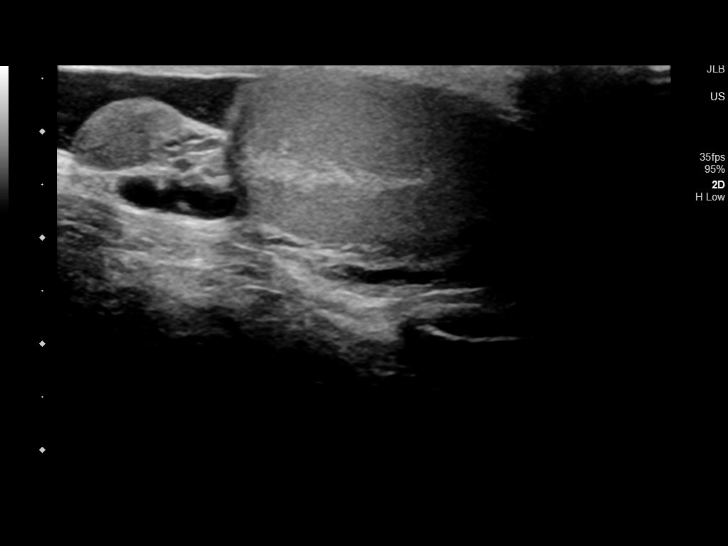
[im 11/65]
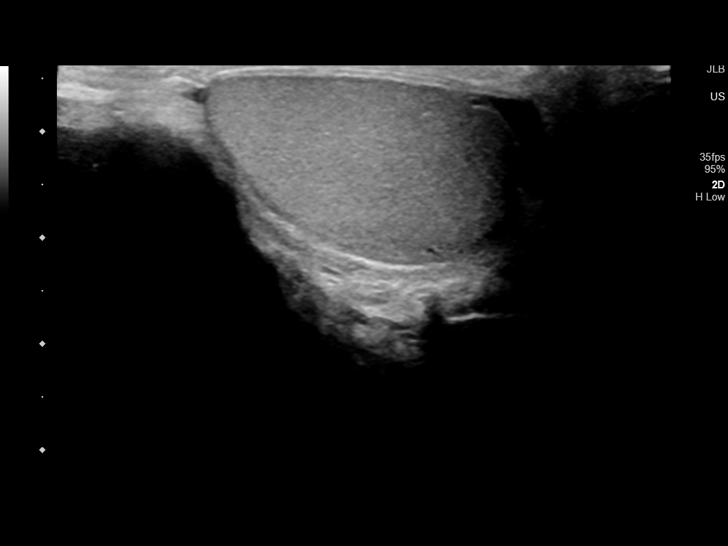
[im 17/65]
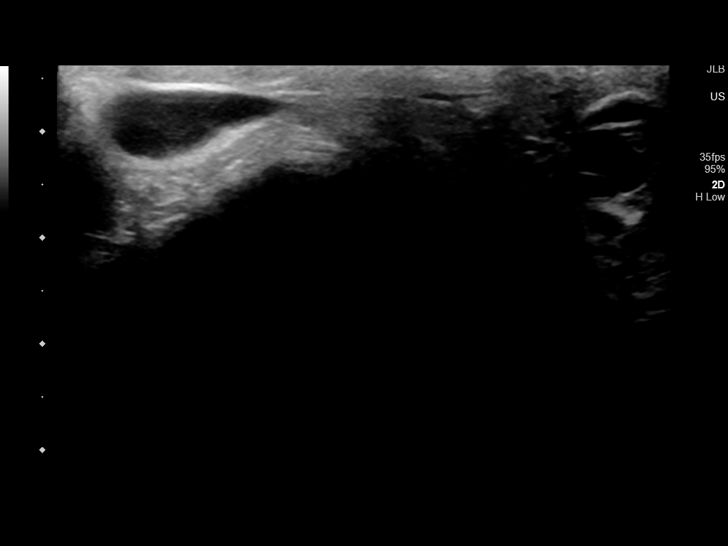
[im 22/65]
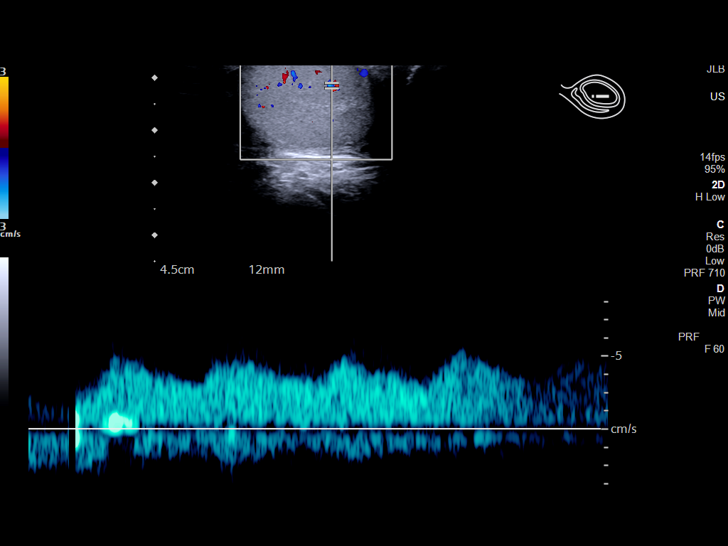
[im 25/65]
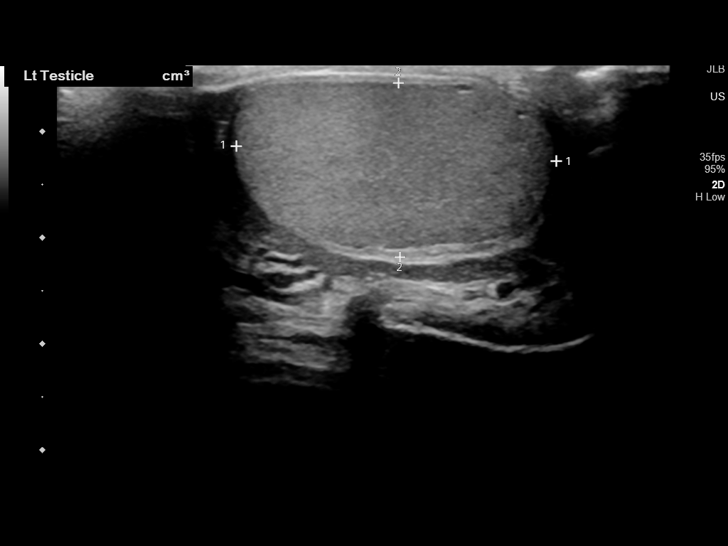
[im 30/65]
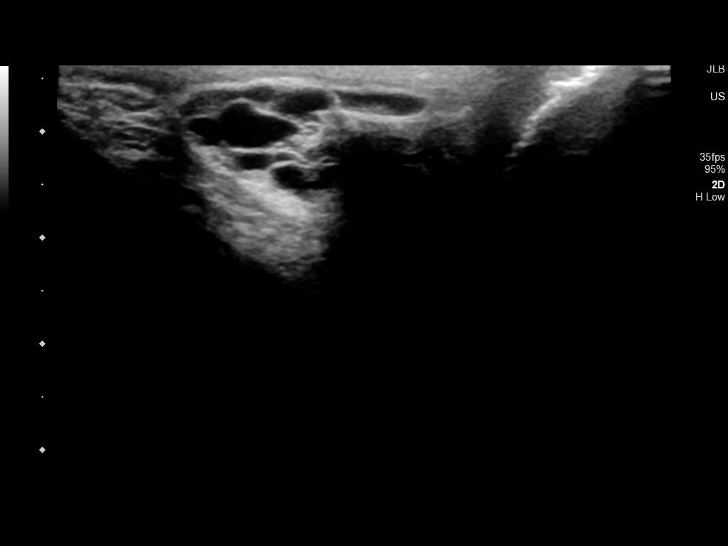
[im 35/65]
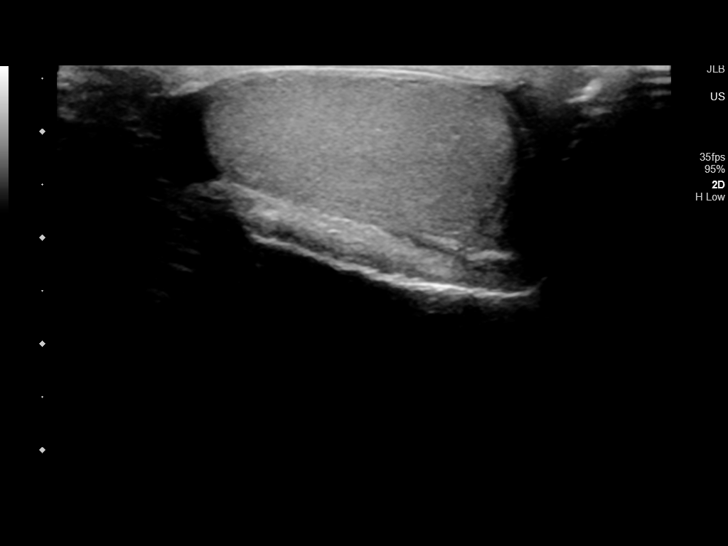
[im 41/65]
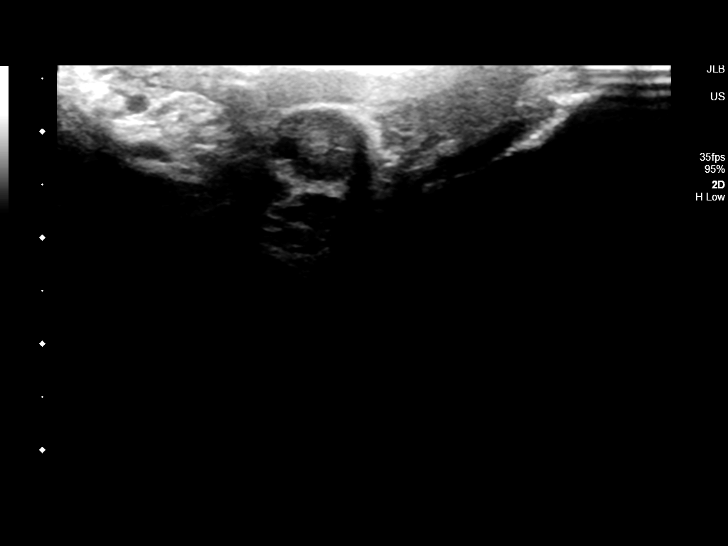
[im 43/65]
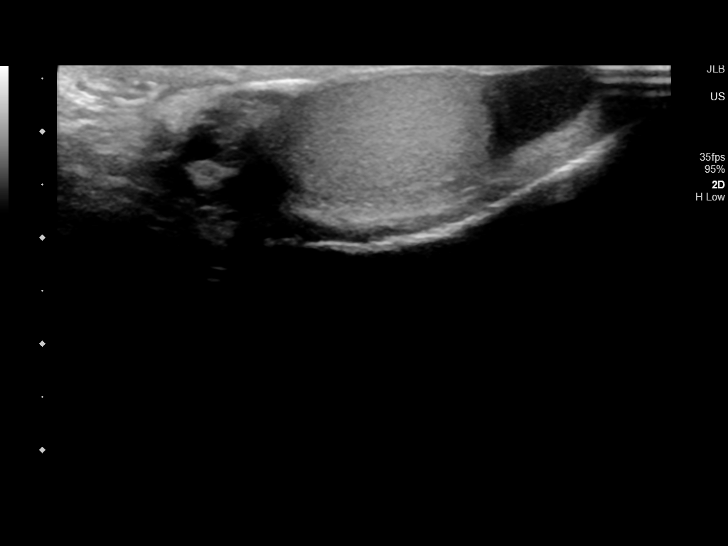
[im 49/65]
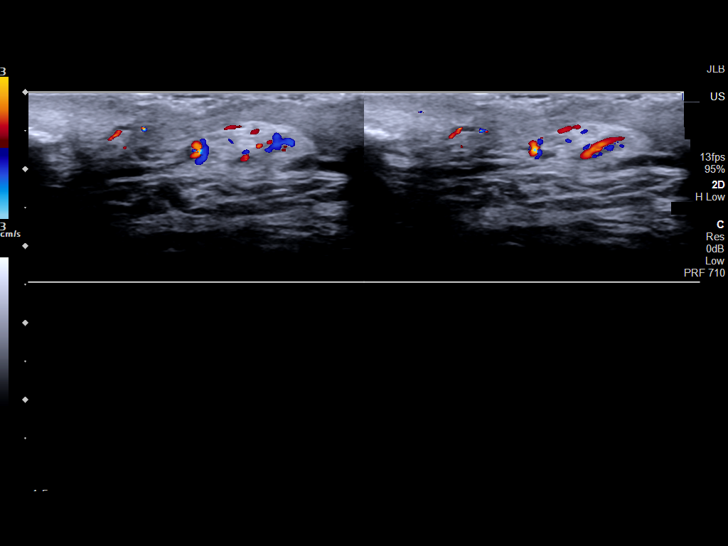
[im 54/65]
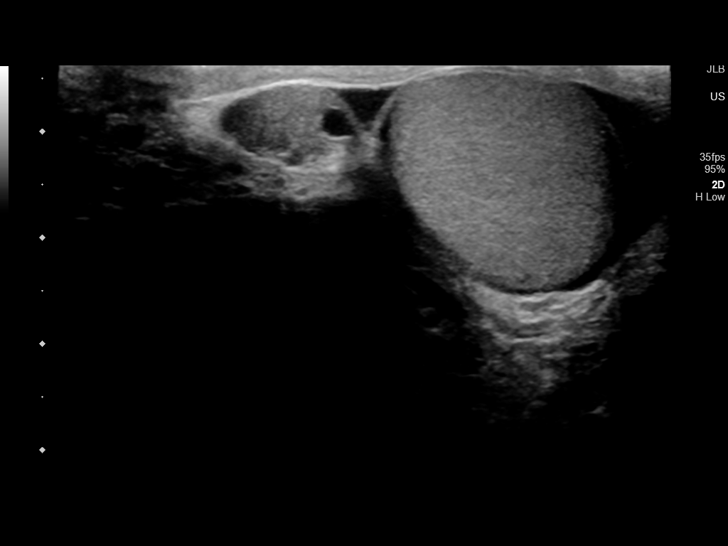
[im 59/65]
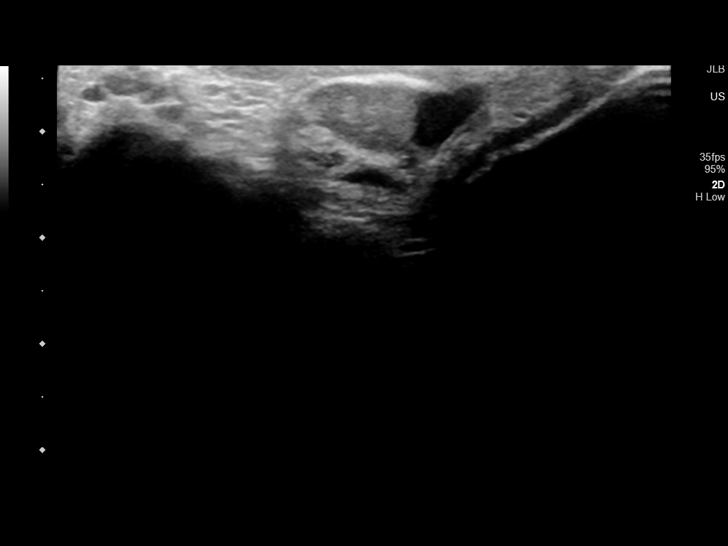
[im 65/65]
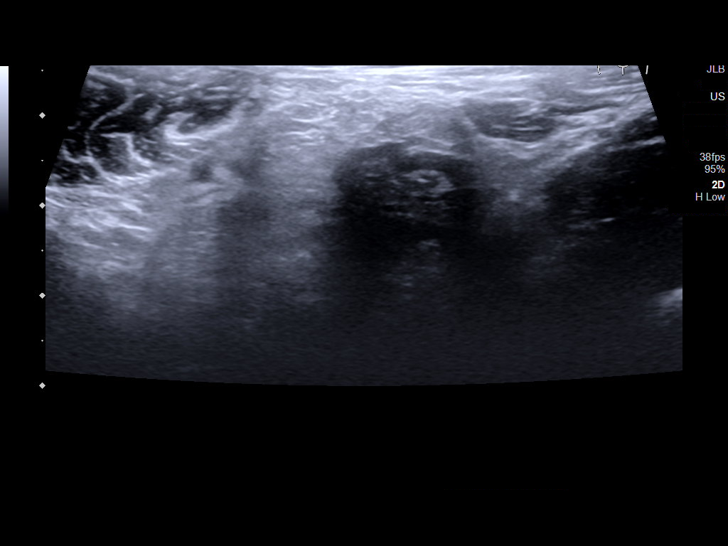

[14 of 25 positions shown; findings below may reference images not displayed]

FINDINGS: Right testicle

Measurements: 2.8 x 2.7 x 1.8 cm. No mass or microlithiasis
visualized. Testicle is in the scrotum.

Left testicle

Measurements: 3.0 x 2.7 x 1.6 cm. No mass or microlithiasis
visualized. Testicle is in the scrotum.

Right epididymis:  Small cyst is noted.

Left epididymis:  Small cysts are noted.

Hydrocele:  Small bilateral hydroceles are noted.

Varicocele:  None visualized.

Pulsed Doppler interrogation of both testes demonstrates normal low
resistance arterial and venous waveforms bilaterally.
IMPRESSION: No evidence of testicular mass or torsion. Both testicles are noted
to be in the scrotum. Small bilateral epididymal cysts are noted.
Small bilateral hydroceles are noted.

## 2023-02-10 ENCOUNTER — Emergency Department: Payer: Medicaid Other

## 2023-02-10 ENCOUNTER — Inpatient Hospital Stay
Admission: EM | Admit: 2023-02-10 | Discharge: 2023-02-15 | DRG: 683 | Disposition: A | Discharge status: Home / Self Care | Payer: Medicaid Other | Attending: Internal Medicine | Admitting: Internal Medicine

## 2023-02-10 ENCOUNTER — Other Ambulatory Visit: Payer: Self-pay

## 2023-02-10 DIAGNOSIS — N401 Enlarged prostate with lower urinary tract symptoms: Secondary | ICD-10-CM | POA: Diagnosis present

## 2023-02-10 DIAGNOSIS — E861 Hypovolemia: Secondary | ICD-10-CM | POA: Diagnosis present

## 2023-02-10 DIAGNOSIS — D72829 Elevated white blood cell count, unspecified: Secondary | ICD-10-CM | POA: Diagnosis present

## 2023-02-10 DIAGNOSIS — Z79899 Other long term (current) drug therapy: Secondary | ICD-10-CM | POA: Diagnosis not present

## 2023-02-10 DIAGNOSIS — Z813 Family history of other psychoactive substance abuse and dependence: Secondary | ICD-10-CM | POA: Diagnosis not present

## 2023-02-10 DIAGNOSIS — E860 Dehydration: Secondary | ICD-10-CM | POA: Diagnosis present

## 2023-02-10 DIAGNOSIS — E785 Hyperlipidemia, unspecified: Secondary | ICD-10-CM | POA: Diagnosis present

## 2023-02-10 DIAGNOSIS — E8721 Acute metabolic acidosis: Secondary | ICD-10-CM | POA: Diagnosis present

## 2023-02-10 DIAGNOSIS — I959 Hypotension, unspecified: Secondary | ICD-10-CM | POA: Diagnosis present

## 2023-02-10 DIAGNOSIS — M6282 Rhabdomyolysis: Secondary | ICD-10-CM | POA: Diagnosis present

## 2023-02-10 DIAGNOSIS — I2510 Atherosclerotic heart disease of native coronary artery without angina pectoris: Secondary | ICD-10-CM | POA: Diagnosis present

## 2023-02-10 DIAGNOSIS — Z1152 Encounter for screening for COVID-19: Secondary | ICD-10-CM | POA: Diagnosis not present

## 2023-02-10 DIAGNOSIS — N179 Acute kidney failure, unspecified: Principal | ICD-10-CM | POA: Diagnosis present

## 2023-02-10 DIAGNOSIS — E871 Hypo-osmolality and hyponatremia: Secondary | ICD-10-CM | POA: Diagnosis present

## 2023-02-10 DIAGNOSIS — Z87730 Personal history of (corrected) cleft lip and palate: Secondary | ICD-10-CM | POA: Diagnosis not present

## 2023-02-10 DIAGNOSIS — K219 Gastro-esophageal reflux disease without esophagitis: Secondary | ICD-10-CM | POA: Diagnosis present

## 2023-02-10 DIAGNOSIS — N281 Cyst of kidney, acquired: Secondary | ICD-10-CM | POA: Diagnosis present

## 2023-02-10 DIAGNOSIS — N1831 Chronic kidney disease, stage 3a: Secondary | ICD-10-CM | POA: Diagnosis present

## 2023-02-10 DIAGNOSIS — I129 Hypertensive chronic kidney disease with stage 1 through stage 4 chronic kidney disease, or unspecified chronic kidney disease: Secondary | ICD-10-CM | POA: Diagnosis present

## 2023-02-10 DIAGNOSIS — Z8249 Family history of ischemic heart disease and other diseases of the circulatory system: Secondary | ICD-10-CM

## 2023-02-10 DIAGNOSIS — Z823 Family history of stroke: Secondary | ICD-10-CM | POA: Diagnosis not present

## 2023-02-10 DIAGNOSIS — N400 Enlarged prostate without lower urinary tract symptoms: Secondary | ICD-10-CM

## 2023-02-10 DIAGNOSIS — F17210 Nicotine dependence, cigarettes, uncomplicated: Secondary | ICD-10-CM | POA: Diagnosis present

## 2023-02-10 DIAGNOSIS — I10 Essential (primary) hypertension: Secondary | ICD-10-CM | POA: Diagnosis present

## 2023-02-10 LAB — URINALYSIS, ROUTINE W REFLEX MICROSCOPIC
Bilirubin Urine: NEGATIVE
Glucose, UA: NEGATIVE mg/dL
Ketones, ur: NEGATIVE mg/dL
Leukocytes,Ua: NEGATIVE
Nitrite: NEGATIVE
Protein, ur: 100 mg/dL — AB
Specific Gravity, Urine: 1.017 (ref 1.005–1.030)
pH: 5 (ref 5.0–8.0)

## 2023-02-10 LAB — COMPREHENSIVE METABOLIC PANEL
ALT: 24 U/L (ref 0–44)
AST: 47 U/L — ABNORMAL HIGH (ref 15–41)
Albumin: 5.2 g/dL — ABNORMAL HIGH (ref 3.5–5.0)
Alkaline Phosphatase: 71 U/L (ref 38–126)
Anion gap: 27 — ABNORMAL HIGH (ref 5–15)
BUN: 44 mg/dL — ABNORMAL HIGH (ref 6–20)
CO2: 9 mmol/L — ABNORMAL LOW (ref 22–32)
Calcium: 9.6 mg/dL (ref 8.9–10.3)
Chloride: 93 mmol/L — ABNORMAL LOW (ref 98–111)
Creatinine, Ser: 4.65 mg/dL — ABNORMAL HIGH (ref 0.61–1.24)
GFR, Estimated: 14 mL/min — ABNORMAL LOW (ref >60)
Glucose, Bld: 160 mg/dL — ABNORMAL HIGH (ref 70–99)
Potassium: 3.5 mmol/L (ref 3.5–5.1)
Sodium: 129 mmol/L — ABNORMAL LOW (ref 135–145)
Total Bilirubin: 0.9 mg/dL (ref <1.2)
Total Protein: 8.9 g/dL — ABNORMAL HIGH (ref 6.5–8.1)

## 2023-02-10 LAB — BLOOD GAS, VENOUS
Acid-base deficit: 6.6 mmol/L — ABNORMAL HIGH (ref 0.0–2.0)
Bicarbonate: 18.3 mmol/L — ABNORMAL LOW (ref 20.0–28.0)
O2 Saturation: 75.2 %
Patient temperature: 37
pCO2, Ven: 34 mm[Hg] — ABNORMAL LOW (ref 44–60)
pH, Ven: 7.34 (ref 7.25–7.43)
pO2, Ven: 47 mm[Hg] — ABNORMAL HIGH (ref 32–45)

## 2023-02-10 LAB — CBC WITH DIFFERENTIAL/PLATELET
Abs Immature Granulocytes: 0.26 10*3/uL — ABNORMAL HIGH (ref 0.00–0.07)
Basophils Absolute: 0.1 10*3/uL (ref 0.0–0.1)
Basophils Relative: 0 %
Eosinophils Absolute: 0.1 10*3/uL (ref 0.0–0.5)
Eosinophils Relative: 0 %
HCT: 52.9 % — ABNORMAL HIGH (ref 39.0–52.0)
Hemoglobin: 18.4 g/dL — ABNORMAL HIGH (ref 13.0–17.0)
Immature Granulocytes: 1 %
Lymphocytes Relative: 4 %
Lymphs Abs: 0.8 10*3/uL (ref 0.7–4.0)
MCH: 30.5 pg (ref 26.0–34.0)
MCHC: 34.8 g/dL (ref 30.0–36.0)
MCV: 87.7 fL (ref 80.0–100.0)
Monocytes Absolute: 1.5 10*3/uL — ABNORMAL HIGH (ref 0.1–1.0)
Monocytes Relative: 7 %
Neutro Abs: 20.1 10*3/uL — ABNORMAL HIGH (ref 1.7–7.7)
Neutrophils Relative %: 88 %
Platelets: 401 10*3/uL — ABNORMAL HIGH (ref 150–400)
RBC: 6.03 MIL/uL — ABNORMAL HIGH (ref 4.22–5.81)
RDW: 14 % (ref 11.5–15.5)
WBC: 22.9 10*3/uL — ABNORMAL HIGH (ref 4.0–10.5)
nRBC: 0 % (ref 0.0–0.2)

## 2023-02-10 LAB — SODIUM, URINE, RANDOM: Sodium, Ur: 67 mmol/L

## 2023-02-10 LAB — CREATININE, URINE, RANDOM: Creatinine, Urine: 180 mg/dL

## 2023-02-10 LAB — HIV ANTIBODY (ROUTINE TESTING W REFLEX): HIV Screen 4th Generation wRfx: NONREACTIVE

## 2023-02-10 LAB — URIC ACID: Uric Acid, Serum: 16.7 mg/dL — ABNORMAL HIGH (ref 3.7–8.6)

## 2023-02-10 LAB — CK: Total CK: 5597 U/L — ABNORMAL HIGH (ref 49–397)

## 2023-02-10 MED ORDER — SODIUM BICARBONATE 8.4 % IV SOLN
INTRAVENOUS | Status: DC
  Filled 2023-02-10: qty 150
  Filled 2023-02-10 (×2): qty 1000
  Filled 2023-02-10: qty 150
  Filled 2023-02-10 (×2): qty 1000

## 2023-02-10 MED ORDER — ONDANSETRON HCL 4 MG/2ML IJ SOLN
4.0000 mg | Freq: Once | INTRAMUSCULAR | Status: AC
  Administered 2023-02-10: 4 mg via INTRAVENOUS

## 2023-02-10 MED ORDER — SODIUM CHLORIDE 0.9 % IV SOLN: INTRAVENOUS | Status: DC

## 2023-02-10 MED ORDER — PANTOPRAZOLE SODIUM 20 MG PO TBEC
20.0000 mg | DELAYED_RELEASE_TABLET | Freq: Every day | ORAL | Status: DC
  Administered 2023-02-10 – 2023-02-15 (×6): 20 mg via ORAL
  Filled 2023-02-10 (×6): qty 1

## 2023-02-10 MED ORDER — HEPARIN SODIUM (PORCINE) 5000 UNIT/ML IJ SOLN
5000.0000 [IU] | Freq: Two times a day (BID) | INTRAMUSCULAR | Status: DC
  Administered 2023-02-10 – 2023-02-14 (×8): 5000 [IU] via SUBCUTANEOUS
  Filled 2023-02-10 (×8): qty 1

## 2023-02-10 MED ORDER — SODIUM CHLORIDE 0.9 % IV BOLUS
1000.0000 mL | Freq: Once | INTRAVENOUS | Status: AC
  Administered 2023-02-10: 1000 mL via INTRAVENOUS

## 2023-02-10 MED ORDER — ONDANSETRON HCL 4 MG PO TABS
4.0000 mg | ORAL_TABLET | Freq: Four times a day (QID) | ORAL | Status: DC | PRN
Start: 2023-02-10 — End: 2023-02-15

## 2023-02-10 MED ORDER — HYDRALAZINE HCL 20 MG/ML IJ SOLN
5.0000 mg | Freq: Four times a day (QID) | INTRAMUSCULAR | Status: DC | PRN
  Administered 2023-02-13: 5 mg via INTRAVENOUS
  Filled 2023-02-10: qty 1

## 2023-02-10 MED ORDER — ONDANSETRON HCL 4 MG/2ML IJ SOLN
INTRAMUSCULAR | Status: AC
  Filled 2023-02-10: qty 2

## 2023-02-10 MED ORDER — ALBUTEROL SULFATE (2.5 MG/3ML) 0.083% IN NEBU: 2.5000 mg | INHALATION_SOLUTION | Freq: Four times a day (QID) | RESPIRATORY_TRACT | Status: DC | PRN

## 2023-02-10 MED ORDER — SODIUM CHLORIDE 0.9 % IV SOLN: Freq: Once | INTRAVENOUS | Status: AC

## 2023-02-10 MED ORDER — ONDANSETRON HCL 4 MG/2ML IJ SOLN
4.0000 mg | Freq: Four times a day (QID) | INTRAMUSCULAR | Status: DC | PRN
  Administered 2023-02-12 – 2023-02-13 (×2): 4 mg via INTRAVENOUS
  Filled 2023-02-10 (×2): qty 2

## 2023-02-10 NOTE — H&P (Signed)
History and Physical    Devin Chan ZOX:[id number] DOB: [birth year] DOA: 02/10/2023  PCP: [facility] (Confirm with patient/family/NH records and if not entered, this has to be entered at [facility] point of entry) Patient coming from: Home  I have personally briefly reviewed patient's old medical records in [facility]  Chief Complaint: N/V, not making urine  HPI: Devin Chan is a 57 y.o. male with medical history significant of HTN, GERD, presented with repeated nauseous vomiting dehydration and decreased urine output.  Patient went to have a party on Saturday night, ate a burger for meal, and then Sunday morning patient suddenly started to have severe pain like epigastric pain, intermittent associated with severe nauseous vomiting stomach content 10-20 times whole day yesterday.  Unable to tolerate any food or liquid.  He took 1 pill of OTC pain meds for the pain but vomited it off after 15 minutes.  And denies any fever chills or diarrhea.  Last vomiting was this morning and abdominal pain subsided.  Patient also reported that he has a chronic urinary hesitation and broken stream increased nocturnal urination since last year and last 2 months has been worse.  Denies any dysuria or back pain at this point.  ED Course: Afebrile, blood pressure 120/90, heart rate 65 not hypoxic.  Blood work showed creatinine 4.6 compared to baseline 1.0, bicarb 9, BUN 44, K3.5, sodium 129, WBC 22 compared to baseline 14.5, hemoglobin 18, platelet 401.  Patient was given IV bolus 1000 mL x 1 in the ED.  Review of Systems: As per HPI otherwise 14 point review of systems negative.    Past Medical History:  Diagnosis Date   Arthritis    Asthma    Depression     Past Surgical History:  Procedure Laterality Date   CLEFT PALATE REPAIR  age 9   [facility]   ESOPHAGOGASTRODUODENOSCOPY N/A 08/09/2020   Procedure: ESOPHAGOGASTRODUODENOSCOPY (EGD);  Surgeon: Toledo, Boykin Nearing, MD;   Location: [facility] ENDOSCOPY;  Service: Gastroenterology;  Laterality: N/A;   ESOPHAGOGASTRODUODENOSCOPY (EGD) WITH PROPOFOL N/A 04/05/2019   Procedure: ESOPHAGOGASTRODUODENOSCOPY (EGD) WITH PROPOFOL;  Surgeon: Toney Reil, MD;  Location: [facility] ENDOSCOPY;  Service: Gastroenterology;  Laterality: N/A;   NO PAST SURGERIES       reports that he has been smoking cigarettes. He started smoking about 41 years ago. He has a 30 pack-year smoking history. He has never used smokeless tobacco. He reports current alcohol use of about 27.0 standard drinks of alcohol per week. He reports current drug use. Drug: Marijuana.  Allergies  Allergen Reactions   Morphine And Codeine Other (See Comments)    Morphine doesn't work but hydromorphone does    Family History  Problem Relation Age of Onset   Drug abuse Father    Stroke Father    Hypertension Father     Prior to Admission medications   Medication Sig Start Date End Date Taking? Authorizing Provider  albuterol (VENTOLIN HFA) 108 (90 Base) MCG/ACT inhaler Inhale 2 puffs into the lungs every 6 (six) hours as needed for wheezing or shortness of breath.   Yes [provider]  amLODipine (NORVASC) 10 MG tablet Take 1 tablet (10 mg total) by mouth daily. 04/05/19  Yes Wieting, Richard, MD  pantoprazole (PROTONIX) 20 MG tablet Take 20 mg by mouth daily. 01/13/23  Yes [provider]    Physical Exam: Vitals:   02/10/23 0525 02/10/23 0533 02/10/23 1009 02/10/23 1013  BP:  (!) 125/91 Marland Kitchen)  160/106   Pulse:  65 97   Resp:  18 18   Temp:  98.2 F (36.8 C)  98 F (36.7 C)  TempSrc:  Oral    SpO2:  96% 98%   Weight: 59 kg     Height: 5\' 6"  (1.676 m)       Constitutional: NAD, calm, comfortable Vitals:   02/10/23 0525 02/10/23 0533 02/10/23 1009 02/10/23 1013  BP:  (!) 125/91 (!) 160/106   Pulse:  65 97   Resp:  18 18   Temp:  98.2 F (36.8 C)  98 F (36.7 C)  TempSrc:  Oral    SpO2:  96% 98%   Weight: 59 kg     Height: 5'  6" (1.676 m)      Eyes: PERRL, lids and conjunctivae normal ENMT: Mucous membranes are dry. Posterior pharynx clear of any exudate or lesions.Normal dentition.  Neck: normal, supple, no masses, no thyromegaly Respiratory: clear to auscultation bilaterally, no wheezing, no crackles. Normal respiratory effort. No accessory muscle use.  Cardiovascular: Regular rate and rhythm, no murmurs / rubs / gallops. No extremity edema. 2+ pedal pulses. No carotid bruits.  Abdomen: no tenderness, no masses palpated. No hepatosplenomegaly. Bowel sounds positive.  Musculoskeletal: no clubbing / cyanosis. No joint deformity upper and lower extremities. Good ROM, no contractures. Normal muscle tone.  Skin: no rashes, lesions, ulcers. No induration Neurologic: CN 2-12 grossly intact. Sensation intact, DTR normal. Strength 5/5 in all 4.  Psychiatric: Normal judgment and insight. Alert and oriented x 3. Normal mood.    Labs on Admission: I have personally reviewed following labs and imaging studies  CBC: Recent Labs  Lab 02/10/23 0529  WBC 22.9*  NEUTROABS 20.1*  HGB 18.4*  HCT 52.9*  MCV 87.7  PLT 401*   Basic Metabolic Panel: Recent Labs  Lab 02/10/23 0529  NA 129*  K 3.5  CL 93*  CO2 9*  GLUCOSE 160*  BUN 44*  CREATININE 4.65*  CALCIUM 9.6   GFR: Estimated Creatinine Clearance: 14.6 mL/min (A) (by C-G formula based on SCr of 4.65 mg/dL (H)). Liver Function Tests: Recent Labs  Lab 02/10/23 0529  AST 47*  ALT 24  ALKPHOS 71  BILITOT 0.9  PROT 8.9*  ALBUMIN 5.2*   No results for input(s): "LIPASE", "AMYLASE" in the last 168 hours. No results for input(s): "AMMONIA" in the last 168 hours. Coagulation Profile: No results for input(s): "INR", "PROTIME" in the last 168 hours. Cardiac Enzymes: No results for input(s): "CKTOTAL", "CKMB", "CKMBINDEX", "TROPONINI" in the last 168 hours. BNP (last 3 results) No results for input(s): "PROBNP" in the last 8760 hours. HbA1C: No results  for input(s): "HGBA1C" in the last 72 hours. CBG: No results for input(s): "GLUCAP" in the last 168 hours. Lipid Profile: No results for input(s): "CHOL", "HDL", "LDLCALC", "TRIG", "CHOLHDL", "LDLDIRECT" in the last 72 hours. Thyroid Function Tests: No results for input(s): "TSH", "T4TOTAL", "FREET4", "T3FREE", "THYROIDAB" in the last 72 hours. Anemia Panel: No results for input(s): "VITAMINB12", "FOLATE", "FERRITIN", "TIBC", "IRON", "RETICCTPCT" in the last 72 hours. Urine analysis:    Component Value Date/Time   COLORURINE YELLOW (A) 08/09/2020 1338   APPEARANCEUR HAZY (A) 08/09/2020 1338   LABSPEC 1.014 08/09/2020 1338   PHURINE 5.0 08/09/2020 1338   GLUCOSEU NEGATIVE 08/09/2020 1338   HGBUR MODERATE (A) 08/09/2020 1338   BILIRUBINUR NEGATIVE 08/09/2020 1338   KETONESUR NEGATIVE 08/09/2020 1338   PROTEINUR 30 (A) 08/09/2020 1338   UROBILINOGEN 0.2 10/04/2009 1643  NITRITE NEGATIVE 08/09/2020 1338   LEUKOCYTESUR NEGATIVE 08/09/2020 1338    Radiological Exams on Admission: No results found.  EKG: Ordered  Assessment/Plan Principal Problem:   AKI (acute kidney injury) (HCC) Active Problems:   Acute renal failure superimposed on stage 3a chronic kidney disease (HCC)  (please populate well all problems here in Problem List. (For example, if patient is on BP meds at home and you resume or decide to hold them, it is a problem that needs to be her. Same for CAD, COPD, HLD and so on)  AKI Acute combined anion gap and non-anion gap metabolic acidosis Acute uremia and acute azotemia -Probably ATN, with oligo uria -Etiology likely prerenal secondary to severe dehydration and hypotension -Continue IV fluid -Check renal ultrasound, CK and uric acid level -DDx, may also have elements of post renal as patient has symptoms of BPH.  Will check renal ultrasound and PVR.  Consider Flomax  Leukocytosis -Likely from hemoconcentration as there is also a increased H&H as well as platelet  count and CBC -Hydration as above and recheck CBC tomorrow -Monitor off antibiotics  Hyponatremia -Hypovolemic secondary to severe dehydration, hydration as above -Recheck sodium level tomorrow  Intractable nauseous vomiting abdominal pain -Resolved.  Clinically suspect acute gastritis versus acute food toxin ingestion  HTN -As needed hydralazine for today  DVT prophylaxis: Heparin subcu Code Status: Full code Family Communication: Close neighbor at bedside Disposition Plan: Patient is sick with AKI and possibly ATN with oliguria, requiring close monitoring and inpatient nephrology consult, expect more than 2 midnight hospital stay Consults called: Nephrology Dr. Lourdes Sledge Admission status: Telemetry admission   Emeline General MD [facility] Pager [phone]  02/10/2023, 12:28 PM

## 2023-02-10 NOTE — ED Notes (Signed)
Lab contacted x2 to collect vbg

## 2023-02-10 NOTE — Consult Note (Addendum)
[facility]  CONSULT NOTE    Date: 02/10/2023                  Patient Name:  Devin Chan  MRN: [id number]  DOB: Jul 04, 1965  Age / Sex: 57 y.o., male         PCP: [facility]                 Service Requesting Consult: TR[facility]                 Reason for Consult: Acute kidney injury            History of Present Illness: Devin Chan is a 57 y.o.  male with past medical conditions including GERD, hypertension, arthritis, and asthma, who was admitted to [facility] on 02/10/2023 for AKI (acute kidney injury) [facility]) [N17.9]  Patient presents to the emergency department after being found down in his kitchen for by a neighbor.  Blood pressure recorded by EMS, 88/50.  Patient received 500 mL bolus and route to hospital. Patient states he does not eat greasy food but ate some at a party on Saturday. Says he left the party early due to not feeling well. Began vomiting once he was home. He remembers urinating during the night on Saturday, but not since then until today in ED. Recent NSAID use. History of hypertension, states he is compliant with medications. Annual physical completed with PCP in July, states his labs were normal.   Labs on ED arrival significant for sodium 129, serum bicarb 9, glucose 160, BUN 44, creatinine 4.65 with GFR 14, albumin 5.2, and uric acid 16.7.  CK 5597.  Elevated white count 22.9 with hemoglobin 18.4.  UA appears cloudy with hematuria and mild proteinuria.  Chest x-ray negative.  Renal ultrasound shows evidence of chronic renal disease with left kidney cyst.   Medications: Outpatient medications: (Not in a hospital admission)   Current medications: Current Facility-Administered Medications  Medication Dose Route Frequency Provider Last Rate Last Admin   0.9 %  sodium chloride infusion   Intravenous Continuous Mikey College T, MD       albuterol (PROVENTIL) (2.5 MG/3ML) 0.083% nebulizer solution 2.5 mg  2.5 mg  Inhalation Q6H PRN Emeline General, MD       heparin injection 5,000 Units  5,000 Units Subcutaneous Q12H Mikey College T, MD       hydrALAZINE (APRESOLINE) injection 5 mg  5 mg Intravenous Q6H PRN Emeline General, MD       ondansetron [facility]) 4 MG/2ML injection            ondansetron (ZOFRAN) tablet 4 mg  4 mg Oral Q6H PRN Emeline General, MD       Or   ondansetron [facility]) injection 4 mg  4 mg Intravenous Q6H PRN Emeline General, MD       pantoprazole (PROTONIX) EC tablet 20 mg  20 mg Oral Daily Mikey College T, MD   20 mg at 02/10/23 1343   Current Outpatient Medications  Medication Sig Dispense Refill   albuterol (VENTOLIN HFA) 108 (90 Base) MCG/ACT inhaler Inhale 2 puffs into the lungs every 6 (six) hours as needed for wheezing or shortness of breath.     amLODipine (NORVASC) 10 MG tablet Take 1 tablet (10 mg total) by mouth daily. 30 tablet 0   pantoprazole (PROTONIX) 20 MG tablet Take 20 mg by mouth daily.  Allergies: Allergies  Allergen Reactions   Morphine And Codeine Other (See Comments)    Morphine doesn't work but hydromorphone does      Past Medical History: Past Medical History:  Diagnosis Date   Arthritis    Asthma    Depression      Past Surgical History: Past Surgical History:  Procedure Laterality Date   CLEFT PALATE REPAIR  age 45   [facility]   ESOPHAGOGASTRODUODENOSCOPY N/A 08/09/2020   Procedure: ESOPHAGOGASTRODUODENOSCOPY (EGD);  Surgeon: Toledo, Boykin Nearing, MD;  Location: [facility] ENDOSCOPY;  Service: Gastroenterology;  Laterality: N/A;   ESOPHAGOGASTRODUODENOSCOPY (EGD) WITH PROPOFOL N/A 04/05/2019   Procedure: ESOPHAGOGASTRODUODENOSCOPY (EGD) WITH PROPOFOL;  Surgeon: Toney Reil, MD;  Location: [facility] ENDOSCOPY;  Service: Gastroenterology;  Laterality: N/A;   NO PAST SURGERIES       Family History: Family History  Problem Relation Age of Onset   Drug abuse Father    Stroke Father    Hypertension Father      Social History: Social History    Socioeconomic History   Marital status: Single    Spouse name: Not on file   Number of children: Not on file   Years of education: Not on file   Highest education level: Not on file  Occupational History   Not on file  Tobacco Use   Smoking status: Every Day    Current packs/day: 0.00    Average packs/day: 1 pack/day for 30.0 years (30.0 ttl pk-yrs)    Types: Cigarettes    Start date: 04/30/1981    Last attempt to quit: 05/01/2011    Years since quitting: 11.7   Smokeless tobacco: Never   Tobacco comments:    quit x 6 months at age 86, restarted in 2019  Substance and Sexual Activity   Alcohol use: Yes    Alcohol/week: 27.0 standard drinks of alcohol    Types: 15 Cans of beer, 12 Standard drinks or equivalent per week   Drug use: Yes    Types: Marijuana   Sexual activity: Yes  Other Topics Concern   Not on file  Social History Narrative   Not on file   Social Drivers of Health   Financial Resource Strain: Not on file  Food Insecurity: Not on file  Transportation Needs: Not on file  Physical Activity: Not on file  Stress: Not on file  Social Connections: Not on file  Intimate Partner Violence: Not on file     Review of Systems: Review of Systems  Constitutional:  Negative for chills, fever and malaise/fatigue.  HENT:  Negative for congestion, sore throat and tinnitus.   Eyes:  Negative for blurred vision and redness.  Respiratory:  Negative for cough, shortness of breath and wheezing.   Cardiovascular:  Negative for chest pain, palpitations, claudication and leg swelling.  Gastrointestinal:  Positive for nausea and vomiting. Negative for abdominal pain, blood in stool and diarrhea.  Genitourinary:  Negative for flank pain, frequency and hematuria.  Musculoskeletal:  Negative for back pain, falls and myalgias.  Skin:  Negative for rash.  Neurological:  Negative for dizziness, weakness and headaches.  Endo/Heme/Allergies:  Does not bruise/bleed easily.   Psychiatric/Behavioral:  Negative for depression. The patient is not nervous/anxious and does not have insomnia.     Vital Signs: Blood pressure (!) 160/106, pulse 97, temperature 98.4 F (36.9 C), resp. rate 18, height 5\' 6"  (1.676 m), weight 59 kg, SpO2 98%.  Weight trends: Filed Weights   02/10/23 0525  Weight: 59 kg  Physical Exam: General: NAD  Head: Normocephalic, atraumatic. Dry oral mucosal membranes  Eyes: Anicteric  Lungs:  Clear to auscultation, normal effort  Heart: Regular rate and rhythm  Abdomen:  Soft, nontender, non distended  Extremities:  No peripheral edema.  Neurologic: Alert and oriented, moving all four extremities  Skin: No lesions        Lab results: Basic Metabolic Panel: Recent Labs  Lab 02/10/23 0529  NA 129*  K 3.5  CL 93*  CO2 9*  GLUCOSE 160*  BUN 44*  CREATININE 4.65*  CALCIUM 9.6    Liver Function Tests: Recent Labs  Lab 02/10/23 0529  AST 47*  ALT 24  ALKPHOS 71  BILITOT 0.9  PROT 8.9*  ALBUMIN 5.2*   No results for input(s): "LIPASE", "AMYLASE" in the last 168 hours. No results for input(s): "AMMONIA" in the last 168 hours.  CBC: Recent Labs  Lab 02/10/23 0529  WBC 22.9*  NEUTROABS 20.1*  HGB 18.4*  HCT 52.9*  MCV 87.7  PLT 401*    Cardiac Enzymes: Recent Labs  Lab 02/10/23 1237  CKTOTAL 5,597*    BNP: Invalid input(s): "POCBNP"  CBG: No results for input(s): "GLUCAP" in the last 168 hours.  Microbiology: Results for orders placed or performed during the hospital encounter of 08/09/20  Resp Panel by RT-PCR (Flu A&B, Covid) Nasopharyngeal Swab     Status: None   Collection Time: 08/09/20 11:47 AM   Specimen: Nasopharyngeal Swab; Nasopharyngeal(NP) swabs in vial transport medium  Result Value Ref Range Status   SARS Coronavirus 2 by RT PCR NEGATIVE NEGATIVE Final    Comment: (NOTE) SARS-CoV-2 target nucleic acids are NOT DETECTED.  The SARS-CoV-2 RNA is generally detectable in upper  respiratory specimens during the acute phase of infection. The lowest concentration of SARS-CoV-2 viral copies this assay can detect is 138 copies/mL. A negative result does not preclude SARS-Cov-2 infection and should not be used as the sole basis for treatment or other patient management decisions. A negative result may occur with  improper specimen collection/handling, submission of specimen other than nasopharyngeal swab, presence of viral mutation(s) within the areas targeted by this assay, and inadequate number of viral copies(<138 copies/mL). A negative result must be combined with clinical observations, patient history, and epidemiological information. The expected result is Negative.  Fact Sheet for Patients:  BloggerCourse.com  Fact Sheet for Healthcare Providers:  SeriousBroker.it  This test is no t yet approved or cleared by the [place] FDA and  has been authorized for detection and/or diagnosis of SARS-CoV-2 by FDA under an Emergency Use Authorization (EUA). This EUA will remain  in effect (meaning this test can be used) for the duration of the COVID-19 declaration under Section 564(b)(1) of the Act, 21 U.S.C.section 360bbb-3(b)(1), unless the authorization is terminated  or revoked sooner.       Influenza A by PCR NEGATIVE NEGATIVE Final   Influenza B by PCR NEGATIVE NEGATIVE Final    Comment: (NOTE) The Xpert Xpress SARS-CoV-2/FLU/RSV plus assay is intended as an aid in the diagnosis of influenza from Nasopharyngeal swab specimens and should not be used as a sole basis for treatment. Nasal washings and aspirates are unacceptable for Xpert Xpress SARS-CoV-2/FLU/RSV testing.  Fact Sheet for Patients: BloggerCourse.com  Fact Sheet for Healthcare Providers: SeriousBroker.it  This test is not yet approved or cleared by the [place] FDA and has been  authorized for detection and/or diagnosis of SARS-CoV-2 by FDA under an Emergency Use Authorization (EUA). This EUA  will remain in effect (meaning this test can be used) for the duration of the COVID-19 declaration under Section 564(b)(1) of the Act, 21 U.S.C. section 360bbb-3(b)(1), unless the authorization is terminated or revoked.  Performed at [facility], [address] [place], [place] [address]   CULTURE, BLOOD (ROUTINE X 2) w Reflex to ID Panel     Status: None   Collection Time: 08/09/20 11:48 AM   Specimen: BLOOD  Result Value Ref Range Status   Specimen Description BLOOD LEFT ARM  Final   Special Requests   Final    BOTTLES DRAWN AEROBIC AND ANAEROBIC Blood Culture results may not be optimal due to an inadequate volume of blood received in culture bottles   Culture   Final    NO GROWTH 5 DAYS Performed at [facility], [address] [place], [place] [address]    Report Status 08/14/2020 FINAL  Final  CULTURE, BLOOD (ROUTINE X 2) w Reflex to ID Panel     Status: None   Collection Time: 08/09/20  5:57 PM   Specimen: BLOOD  Result Value Ref Range Status   Specimen Description BLOOD LEFT ANTECUBITAL  Final   Special Requests   Final    BOTTLES DRAWN AEROBIC AND ANAEROBIC Blood Culture adequate volume   Culture   Final    NO GROWTH 5 DAYS Performed at [facility], [address] [place], [place] [address]    Report Status 08/14/2020 FINAL  Final    Coagulation Studies: No results for input(s): "LABPROT", "INR" in the last 72 hours.  Urinalysis: Recent Labs    02/10/23 1311  COLORURINE YELLOW*  LABSPEC 1.017  PHURINE 5.0  GLUCOSEU NEGATIVE  HGBUR LARGE*  BILIRUBINUR NEGATIVE  KETONESUR NEGATIVE  PROTEINUR 100*  NITRITE NEGATIVE  LEUKOCYTESUR NEGATIVE      Imaging: US RENAL Result Date: 02/10/2023 CLINICAL DATA:  [id number] AKI (acute kidney injury) (HCC) [id number] EXAM: RENAL / URINARY TRACT ULTRASOUND COMPLETE COMPARISON:   None Available. FINDINGS: The right kidney measured 10.5 cm and the left kidney measured 11.7 cm. The kidneys demonstrate increased echogenicity consistent with chronic medical renal disease. Left kidney cyst measuring 1.1 cm. No shadowing stones are seen. No hydronephrosis identified. Bladder: Appears normal for degree of bladder distention. IMPRESSION: Echogenic kidneys consistent with chronic medical renal disease. Left kidney cyst. Electronically Signed   By: Layla Maw M.D.   On: 02/10/2023 13:31   DG Chest Port 1 View Result Date: 02/10/2023 CLINICAL DATA:  Weakness, dehydration, hypotensive EXAM: PORTABLE CHEST - 1 VIEW COMPARISON:  04/02/2019 FINDINGS: Lungs are clear. Heart size and mediastinal contours are within normal limits. No effusion. Visualized bones unremarkable. IMPRESSION: No acute cardiopulmonary disease. Electronically Signed   By: Corlis Leak M.D.   On: 02/10/2023 12:38     Assessment & Plan: Mr. Ermias Sandiego is a 57 y.o.  male with past medical conditions including GERD, hypertension, arthritis, and asthma, who was admitted to [facility] on 02/10/2023 for AKI (acute kidney injury) (HCC) [N17.9]  Acute kidney injury with most recent baseline creatinine of 1.02 on 08/11/20. Acute kidney injury appears multifactorial from dehydration, hypotension and rhabdomyolysis. CK 5597. Creatinine 4.65 on ED arrival. Renal US shows left kidney cyst. Hold NSAIDs. Agree with IV hydration. No acute need for dialysis. Will monitor closely.   2. Hyponatremia, likely secondary to increased free water intake. Will monitor for now.   3. Acute metabolic acidosis, S Bicarb 9. Will transition to sodium bicarbonate infusion at  147ml/hr  4. Hypertension, Home regimen consist of Amlodipine only. This is currently held, Hydralazine ordered PRN   LOS: 0 Jayce Kainz 12/30/20242:37 PM

## 2023-02-10 NOTE — ED Triage Notes (Signed)
BIB [facility]. Pt called out for dehydration. Pt was found supine on kitchen floor. Bp at that time was 88/50 with HR 130. IV established and 500 mL NS given by EMS. Bp improved to 120/82 with HR 100. Pt reports "my kidneys are failing" with hx of same. Also report he has not urinated since Friday. Pt also had 1 episode n/v en route and was given 4 mg Zofran IV. Pt arrives alert and oriented and following commands. Pt is breathing unlabored and speaking in full sentences with symmetric chest rise and fall.

## 2023-02-10 NOTE — ED Notes (Signed)
Lab contacted to collect vbg

## 2023-02-10 NOTE — ED Notes (Signed)
Urinal provided.

## 2023-02-10 NOTE — ED Notes (Signed)
Dr Chipper Herb notified of post void residual >581mL

## 2023-02-10 NOTE — ED Notes (Signed)
Pt reports needing to urinate after cath. Pt voiding additional 

## 2023-02-10 NOTE — ED Notes (Signed)
When asked, pt states she is wet. Brief changed from urinary and bowel incontinence.

## 2023-02-10 NOTE — ED Notes (Signed)
Pt reports thinks kidneys are failing since eating hamburger and chilli on Friday. States decreased urine ouput since sat. Reports hx kidney problems, denies being on dialysis. Alert and oriented. Call bell in reach. Stretcher locked in lowest position.  Denies ETOH

## 2023-02-10 NOTE — ED Provider Notes (Addendum)
[facility] Provider Note    Event Date/Time   First MD Initiated Contact with Patient 02/10/23 1108     (approximate)   History   Hypotension   HPI  Devin Chan is a 57 y.o. male with a history of cannabis hyperemesis, Barrett's esophagus who presents with dizziness weakness and lightheadedness.  Patient reports over the last couple of days he has noted cramping in his body in his legs, he has not urinated in 3 days.  He reports this is consistent with when he had kidney failure several years ago.  Review of records confirms he was admitted to the hospital for acute kidney injury.  He reports he has been doing well since then.  Denies alcohol use     Physical Exam   Triage Vital Signs: ED Triage Vitals  Encounter Vitals Group     BP 02/10/23 0533 (!) 125/91     Systolic BP Percentile --      Diastolic BP Percentile --      Pulse Rate 02/10/23 0533 65     Resp 02/10/23 0533 18     Temp 02/10/23 0533 98.2 F (36.8 C)     Temp Source 02/10/23 0533 Oral     SpO2 02/10/23 0533 96 %     Weight 02/10/23 0525 59 kg (130 lb)     Height 02/10/23 0525 1.676 m (5\' 6" )     Head Circumference --      Peak Flow --      Pain Score 02/10/23 0525 2     Pain Loc --      Pain Education --      Exclude from Growth Chart --     Most recent vital signs: Vitals:   02/10/23 1009 02/10/23 1013  BP: (!) 160/106   Pulse: 97   Resp: 18   Temp:  98 F (36.7 C)  SpO2: 98%      General: Awake, no distress.  CV:  Good peripheral perfusion.  Resp:  Normal effort.  Abd:  No distention.  Other:     ED Results / Procedures / Treatments   Labs (all labs ordered are listed, but only abnormal results are displayed) Labs Reviewed  CBC WITH DIFFERENTIAL/PLATELET - Abnormal; Notable for the following components:      Result Value   WBC 22.9 (*)    RBC 6.03 (*)    Hemoglobin 18.4 (*)    HCT 52.9 (*)    Platelets 401 (*)    Neutro Abs 20.1 (*)     Monocytes Absolute 1.5 (*)    Abs Immature Granulocytes 0.26 (*)    All other components within normal limits  COMPREHENSIVE METABOLIC PANEL - Abnormal; Notable for the following components:   Sodium 129 (*)    Chloride 93 (*)    CO2 9 (*)    Glucose, Bld 160 (*)    BUN 44 (*)    Creatinine, Ser 4.65 (*)    Total Protein 8.9 (*)    Albumin 5.2 (*)    AST 47 (*)    GFR, Estimated 14 (*)    Anion gap 27 (*)    All other components within normal limits  URINALYSIS, ROUTINE W REFLEX MICROSCOPIC     EKG     RADIOLOGY     PROCEDURES:  Critical Care performed: yes  CRITICAL CARE Performed by: Jene Every   Total critical care time: 30 minutes  Critical care time was exclusive  of separately billable procedures and treating other patients.  Critical care was necessary to treat or prevent imminent or life-threatening deterioration.  Critical care was time spent personally by me on the following activities: development of treatment plan with patient and/or surrogate as well as nursing, discussions with consultants, evaluation of patient's response to treatment, examination of patient, obtaining history from patient or surrogate, ordering and performing treatments and interventions, ordering and review of laboratory studies, ordering and review of radiographic studies, pulse oximetry and re-evaluation of patient's condition.   Procedures   MEDICATIONS ORDERED IN ED: Medications  ondansetron (ZOFRAN) 4 MG/2ML injection (has no administration in time range)  0.9 %  sodium chloride infusion (has no administration in time range)  sodium chloride 0.9 % bolus 1,000 mL (0 mLs Intravenous Stopped 02/10/23 1013)  ondansetron (ZOFRAN) injection 4 mg (4 mg Intravenous Given 02/10/23 0720)     IMPRESSION / MDM / ASSESSMENT AND PLAN / ED COURSE  I reviewed the triage vital signs and the nursing notes. Patient's presentation is most consistent with acute presentation with  potential threat to life or bodily function.  Patient presents after near syncope episode, found to be hypotensive by EMS, given IV fluids with some improvement in blood pressure.  Lab work notable for BUN of 44, creatinine of 4.65 consistent with acute kidney injury.  He also has an elevated white blood cell count however denies cough, shortness of breath.  No dysuria in fact he is not urinating consistent with kidney injury.  No rash.  No fevers.  Not consistent with sepsis  Will treat with IV fluids  Consulted Dr. Cherylann Ratel of nephrology, he will see the patient,  Consulted the hospitalist service for admission      FINAL CLINICAL IMPRESSION(S) / ED DIAGNOSES   Final diagnoses:  Acute renal failure, unspecified acute renal failure type [facility])     Rx / DC Orders   ED Discharge Orders     None        Note:  This document was prepared using Dragon voice recognition software and may include unintentional dictation errors.   Jene Every, MD 02/10/23 1123    Jene Every, MD 02/10/23 [phone]

## 2023-02-10 NOTE — ED Notes (Signed)
Called to floor regarding handoff completion and readiness for patient. Per charge Annice Pih, ok to bring the patient.

## 2023-02-10 NOTE — ED Notes (Signed)
Lab contacted to verify add-ons

## 2023-02-10 NOTE — ED Notes (Signed)
Patient c/o difficulty chewing r/t dentures not in place. MD Zhang notified to modify the diet to suit the patients needs.

## 2023-02-11 LAB — BASIC METABOLIC PANEL
Anion gap: 11 (ref 5–15)
BUN: 35 mg/dL — ABNORMAL HIGH (ref 6–20)
CO2: 25 mmol/L (ref 22–32)
Calcium: 8.5 mg/dL — ABNORMAL LOW (ref 8.9–10.3)
Chloride: 99 mmol/L (ref 98–111)
Creatinine, Ser: 1.64 mg/dL — ABNORMAL HIGH (ref 0.61–1.24)
GFR, Estimated: 48 mL/min — ABNORMAL LOW (ref >60)
Glucose, Bld: 122 mg/dL — ABNORMAL HIGH (ref 70–99)
Potassium: 3.5 mmol/L (ref 3.5–5.1)
Sodium: 135 mmol/L (ref 135–145)

## 2023-02-11 LAB — CBC
HCT: 42.7 % (ref 39.0–52.0)
Hemoglobin: 15.1 g/dL (ref 13.0–17.0)
MCH: 30.9 pg (ref 26.0–34.0)
MCHC: 35.4 g/dL (ref 30.0–36.0)
MCV: 87.3 fL (ref 80.0–100.0)
Platelets: 311 10*3/uL (ref 150–400)
RBC: 4.89 MIL/uL (ref 4.22–5.81)
RDW: 13.9 % (ref 11.5–15.5)
WBC: 17.1 10*3/uL — ABNORMAL HIGH (ref 4.0–10.5)
nRBC: 0 % (ref 0.0–0.2)

## 2023-02-11 LAB — CK: Total CK: 11138 U/L — ABNORMAL HIGH (ref 49–397)

## 2023-02-11 MED ORDER — SODIUM CHLORIDE 0.9 % IV SOLN: INTRAVENOUS | Status: DC

## 2023-02-11 MED ORDER — ENSURE ENLIVE PO LIQD
237.0000 mL | Freq: Two times a day (BID) | ORAL | Status: DC
  Administered 2023-02-13 – 2023-02-14 (×3): 237 mL via ORAL

## 2023-02-11 NOTE — Plan of Care (Signed)

## 2023-02-11 NOTE — Progress Notes (Addendum)
 PROGRESS NOTE   HPI was taken from Dr. MYRTIS Mana: Devin Chan is a 57 y.o. male with medical history significant of HTN, GERD, presented with repeated nauseous vomiting dehydration and decreased urine output.   Patient went to have a party on Saturday night, ate a burger for meal, and then Sunday morning patient suddenly started to have severe pain like epigastric pain, intermittent associated with severe nauseous vomiting stomach content 10-20 times whole day yesterday.  Unable to tolerate any food or liquid.  He took 1 pill of OTC pain meds for the pain but vomited it off after 15 minutes.  And denies any fever chills or diarrhea.  Last vomiting was this morning and abdominal pain subsided.  Patient also reported that he has a chronic urinary hesitation and broken stream increased nocturnal urination since last year and last 2 months has been worse.  Denies any dysuria or back pain at this point.   ED Course: Afebrile, blood pressure 120/90, heart rate 65 not hypoxic.  Blood work showed creatinine 4.6 compared to baseline 1.0, bicarb 9, BUN 44, K3.5, sodium 129, WBC 22 compared to baseline 14.5, hemoglobin 18, platelet 401.   Patient was given IV bolus 1000 mL x 1 in the ED.    Jontavious Commons Nellums  FMW:[id number] DOB: 1965-05-03 DOA: 02/10/2023 PCP: [facility]    Assessment & Plan:   Principal Problem:   AKI (acute kidney injury) (HCC) Active Problems:   Acute renal failure superimposed on stage 3a chronic kidney disease (HCC)  Assessment and Plan:  AKI: Cr is trending down from day prior. Continue on IVFs. Nephro following and recs apprec   Rhabdomyolysis: CK is trending up. Continue on IVFs  Metabolic acidosis: resolved   Leukocytosis: etiology unclear. Trending down. Will continue to monitor    Hyponatremia: WNL today    Intractable nauseous vomiting abdominal pain: resolved. Possibly secondary to acute gastritis vs acute food toxin ingestion    HTN: holding home dose of amlodipine        DVT prophylaxis: heparin   Code Status: full  Family Communication:  Disposition Plan: can likely d/c home tomorrow if Cr and CK improve & nephro clears pt   Level of care: Telemetry Medical  Status is: Inpatient Remains inpatient appropriate because: severity of illness    Consultants:  Nephro   Procedures:   Antimicrobials:   Subjective: Pt c/o generalized weakness   Objective: Vitals:   02/10/23 2200 02/10/23 2237 02/11/23 0503 02/11/23 0758  BP: 129/84 116/86 (!) 154/92 (!) 154/97  Pulse: 80 88 91 89  Resp: (!) 28 18 18 16   Temp:  98.3 F (36.8 C) 98.2 F (36.8 C) 98.3 F (36.8 C)  TempSrc:      SpO2: 96% 97% 95% 94%  Weight:      Height:        Intake/Output Summary (Last 24 hours) at 02/11/2023 0817 Last data filed at 02/11/2023 0443 Gross per 24 hour  Intake 1565.91 ml  Output 1500 ml  Net 65.91 ml   Filed Weights   02/10/23 0525  Weight: 59 kg    Examination:  General exam: Appears calm and comfortable  Respiratory system: Clear to auscultation. Respiratory effort normal. Cardiovascular system: S1 & S2+. No rubs, gallops or clicks.  Gastrointestinal system: Abdomen is nondistended, soft and nontender. Normal bowel sounds heard. Central nervous system: Alert and oriented. Moves all extremities  Psychiatry: Judgement and insight appear normal. Flat mood and affect  Data Reviewed: I have personally reviewed following labs and imaging studies  CBC: Recent Labs  Lab 02/10/23 0529 02/11/23 0434  WBC 22.9* 17.1*  NEUTROABS 20.1*  --   HGB 18.4* 15.1  HCT 52.9* 42.7  MCV 87.7 87.3  PLT 401* 311   Basic Metabolic Panel: Recent Labs  Lab 02/10/23 0529 02/11/23 0434  NA 129* 135  K 3.5 3.5  CL 93* 99  CO2 9* 25  GLUCOSE 160* 122*  BUN 44* 35*  CREATININE 4.65* 1.64*  CALCIUM 9.6 8.5*   GFR: Estimated Creatinine Clearance: 41.5 mL/min (A) (by C-G formula based on SCr of 1.64  mg/dL (H)). Liver Function Tests: Recent Labs  Lab 02/10/23 0529  AST 47*  ALT 24  ALKPHOS 71  BILITOT 0.9  PROT 8.9*  ALBUMIN 5.2*   No results for input(s): LIPASE, AMYLASE in the last 168 hours. No results for input(s): AMMONIA in the last 168 hours. Coagulation Profile: No results for input(s): INR, PROTIME in the last 168 hours. Cardiac Enzymes: Recent Labs  Lab 02/10/23 1237  CKTOTAL 5,597*   BNP (last 3 results) No results for input(s): PROBNP in the last 8760 hours. HbA1C: No results for input(s): HGBA1C in the last 72 hours. CBG: No results for input(s): GLUCAP in the last 168 hours. Lipid Profile: No results for input(s): CHOL, HDL, LDLCALC, TRIG, CHOLHDL, LDLDIRECT in the last 72 hours. Thyroid Function Tests: No results for input(s): TSH, T4TOTAL, FREET4, T3FREE, THYROIDAB in the last 72 hours. Anemia Panel: No results for input(s): VITAMINB12, FOLATE, FERRITIN, TIBC, IRON, RETICCTPCT in the last 72 hours. Sepsis Labs: No results for input(s): PROCALCITON, LATICACIDVEN in the last 168 hours.  No results found for this or any previous visit (from the past 240 hours).       Radiology Studies: US  RENAL Result Date: 02/10/2023 CLINICAL DATA:  [id number] AKI (acute kidney injury) (HCC) [id number] EXAM: RENAL / URINARY TRACT ULTRASOUND COMPLETE COMPARISON:  None Available. FINDINGS: The right kidney measured 10.5 cm and the left kidney measured 11.7 cm. The kidneys demonstrate increased echogenicity consistent with chronic medical renal disease. Left kidney cyst measuring 1.1 cm. No shadowing stones are seen. No hydronephrosis identified. Bladder: Appears normal for degree of bladder distention. IMPRESSION: Echogenic kidneys consistent with chronic medical renal disease. Left kidney cyst. Electronically Signed   By: Fonda Field M.D.   On: 02/10/2023 13:31   DG Chest Port 1 View Result Date: 02/10/2023 CLINICAL  DATA:  Weakness, dehydration, hypotensive EXAM: PORTABLE CHEST - 1 VIEW COMPARISON:  04/02/2019 FINDINGS: Lungs are clear. Heart size and mediastinal contours are within normal limits. No effusion. Visualized bones unremarkable. IMPRESSION: No acute cardiopulmonary disease. Electronically Signed   By: JONETTA Faes M.D.   On: 02/10/2023 12:38        Scheduled Meds:  feeding supplement  237 mL Oral BID BM   heparin   5,000 Units Subcutaneous Q12H   pantoprazole   20 mg Oral Daily   Continuous Infusions:  sodium bicarbonate  150 mEq in dextrose  5 % 1,150 mL infusion 150 mL/hr at 02/11/23 0155     LOS: 1 day        Anthony CHRISTELLA Pouch, MD [facility] Pager [phone]  If 7PM-7AM, please contact night-coverage [email or web address] 02/11/2023, 8:17 AM

## 2023-02-11 NOTE — Progress Notes (Signed)
 [facility]  ROUNDING NOTE   Subjective:   Patient seen sitting up in bed States he feels much better today  Room air No lower extremity edema  Creatinine 1.64 from 4.65 [place] [address] from 5597  Objective:  Vital signs in last 24 hours:  Temp:  [98.2 F (36.8 C)-98.4 F (36.9 C)] 98.3 F (36.8 C) (12/31 1146) Pulse Rate:  [80-109] 84 (12/31 1146) Resp:  [15-31] 16 (12/31 1146) BP: (112-160)/(75-113) 112/75 (12/31 1146) SpO2:  [94 %-99 %] 95 % (12/31 1146)  Weight change:  Filed Weights   02/10/23 0525  Weight: 59 kg    Intake/Output: I/O last 3 completed shifts: In: 1565.9 [I.V.:1565.9] Out: 1500 [Urine:1500]   Intake/Output this shift:  No intake/output data recorded.  Physical Exam: General: NAD  Head: Normocephalic, atraumatic. Moist oral mucosal membranes  Eyes: Anicteric  Lungs:  Clear to auscultation, normal effort  Heart: Regular rate and rhythm  Abdomen:  Soft, nontender,   Extremities:  No peripheral edema.  Neurologic: Alert, moving all four extremities  Skin: No lesions       Basic Metabolic Panel: Recent Labs  Lab 02/10/23 0529 02/11/23 0434  NA 129* 135  K 3.5 3.5  CL 93* 99  CO2 9* 25  GLUCOSE 160* 122*  BUN 44* 35*  CREATININE 4.65* 1.64*  CALCIUM 9.6 8.5*    Liver Function Tests: Recent Labs  Lab 02/10/23 0529  AST 47*  ALT 24  ALKPHOS 71  BILITOT 0.9  PROT 8.9*  ALBUMIN 5.2*   No results for input(s): LIPASE, AMYLASE in the last 168 hours. No results for input(s): AMMONIA in the last 168 hours.  CBC: Recent Labs  Lab 02/10/23 0529 02/11/23 0434  WBC 22.9* 17.1*  NEUTROABS 20.1*  --   HGB 18.4* 15.1  HCT 52.9* 42.7  MCV 87.7 87.3  PLT 401* 311    Cardiac Enzymes: Recent Labs  Lab 02/10/23 1237 02/11/23 0434  CKTOTAL 5,597* 11,138*    BNP: Invalid input(s): POCBNP  CBG: No results for input(s): GLUCAP in the last 168 hours.  Microbiology: Results for  orders placed or performed during the hospital encounter of 08/09/20  Resp Panel by RT-PCR (Flu A&B, Covid) Nasopharyngeal Swab     Status: None   Collection Time: 08/09/20 11:47 AM   Specimen: Nasopharyngeal Swab; Nasopharyngeal(NP) swabs in vial transport medium  Result Value Ref Range Status   SARS Coronavirus 2 by RT PCR NEGATIVE NEGATIVE Final    Comment: (NOTE) SARS-CoV-2 target nucleic acids are NOT DETECTED.  The SARS-CoV-2 RNA is generally detectable in upper respiratory specimens during the acute phase of infection. The lowest concentration of SARS-CoV-2 viral copies this assay can detect is 138 copies/mL. A negative result does not preclude SARS-Cov-2 infection and should not be used as the sole basis for treatment or other patient management decisions. A negative result may occur with  improper specimen collection/handling, submission of specimen other than nasopharyngeal swab, presence of viral mutation(s) within the areas targeted by this assay, and inadequate number of viral copies(<138 copies/mL). A negative result must be combined with clinical observations, patient history, and epidemiological information. The expected result is Negative.  Fact Sheet for Patients:  bloggercourse.com  Fact Sheet for Healthcare Providers:  seriousbroker.it  This test is no t yet approved or cleared by the [place]  FDA and  has been authorized for detection and/or diagnosis of SARS-CoV-2 by FDA under an Emergency Use Authorization (EUA). This EUA will remain  in effect (meaning this test can be used) for the duration of the COVID-19 declaration under Section 564(b)(1) of the Act, 21 U.S.C.section 360bbb-3(b)(1), unless the authorization is terminated  or revoked sooner.       Influenza A by PCR NEGATIVE NEGATIVE Final   Influenza B by PCR NEGATIVE NEGATIVE Final    Comment: (NOTE) The Xpert Xpress SARS-CoV-2/FLU/RSV plus  assay is intended as an aid in the diagnosis of influenza from Nasopharyngeal swab specimens and should not be used as a sole basis for treatment. Nasal washings and aspirates are unacceptable for Xpert Xpress SARS-CoV-2/FLU/RSV testing.  Fact Sheet for Patients: bloggercourse.com  Fact Sheet for Healthcare Providers: seriousbroker.it  This test is not yet approved or cleared by the [place]  FDA and has been authorized for detection and/or diagnosis of SARS-CoV-2 by FDA under an Emergency Use Authorization (EUA). This EUA will remain in effect (meaning this test can be used) for the duration of the COVID-19 declaration under Section 564(b)(1) of the Act, 21 U.S.C. section 360bbb-3(b)(1), unless the authorization is terminated or revoked.  Performed at [facility], [address] [place], [place] [address]   CULTURE, BLOOD (ROUTINE X 2) w Reflex to ID Panel     Status: None   Collection Time: 08/09/20 11:48 AM   Specimen: BLOOD  Result Value Ref Range Status   Specimen Description BLOOD LEFT ARM  Final   Special Requests   Final    BOTTLES DRAWN AEROBIC AND ANAEROBIC Blood Culture results may not be optimal due to an inadequate volume of blood received in culture bottles   Culture   Final    NO GROWTH 5 DAYS Performed at [facility], [address] [place], [place] [address]    Report Status 08/14/2020 FINAL  Final  CULTURE, BLOOD (ROUTINE X 2) w Reflex to ID Panel     Status: None   Collection Time: 08/09/20  5:57 PM   Specimen: BLOOD  Result Value Ref Range Status   Specimen Description BLOOD LEFT ANTECUBITAL  Final   Special Requests   Final    BOTTLES DRAWN AEROBIC AND ANAEROBIC Blood Culture adequate volume   Culture   Final    NO GROWTH 5 DAYS Performed at [facility], [address] [place], [place] [address]    Report Status 08/14/2020 FINAL  Final    Coagulation  Studies: No results for input(s): LABPROT, INR in the last 72 hours.  Urinalysis: Recent Labs    02/10/23 1311  COLORURINE YELLOW*  LABSPEC 1.017  PHURINE 5.0  GLUCOSEU NEGATIVE  HGBUR LARGE*  BILIRUBINUR NEGATIVE  KETONESUR NEGATIVE  PROTEINUR 100*  NITRITE NEGATIVE  LEUKOCYTESUR NEGATIVE      Imaging: US  RENAL Result Date: 02/10/2023 CLINICAL DATA:  [id number] AKI (acute kidney injury) (HCC) [id number] EXAM: RENAL / URINARY TRACT ULTRASOUND COMPLETE COMPARISON:  None Available. FINDINGS: The right kidney measured 10.5 cm and the left kidney measured 11.7 cm. The kidneys demonstrate increased echogenicity consistent with chronic medical renal disease. Left kidney cyst measuring 1.1 cm. No shadowing stones are seen. No hydronephrosis identified. Bladder: Appears normal for degree of bladder distention. IMPRESSION: Echogenic kidneys consistent with chronic medical renal disease. Left kidney cyst. Electronically Signed   By: Fonda Field M.D.   On: 02/10/2023 13:31   DG Chest Port 1 View Result Date: 02/10/2023 CLINICAL DATA:  Weakness, dehydration, hypotensive EXAM: PORTABLE CHEST - 1 VIEW COMPARISON:  04/02/2019 FINDINGS: Lungs are clear. Heart size and mediastinal contours  are within normal limits. No effusion. Visualized bones unremarkable. IMPRESSION: No acute cardiopulmonary disease. Electronically Signed   By: JONETTA Faes M.D.   On: 02/10/2023 12:38     Medications:    sodium bicarbonate  150 mEq in dextrose  5 % 1,150 mL infusion 150 mL/hr at 02/11/23 9072    feeding supplement  237 mL Oral BID BM   heparin   5,000 Units Subcutaneous Q12H   pantoprazole   20 mg Oral Daily   albuterol , hydrALAZINE , ondansetron  **OR** ondansetron  (ZOFRAN ) IV  Assessment/ Plan:  Mr. Devin Chan is a 57 y.o.  male with past medical conditions including GERD, hypertension, arthritis, and asthma, who was admitted to [facility] on 02/10/2023 for AKI (acute kidney injury) (HCC) [N17.9] Acute renal  failure, unspecified acute renal failure type (HCC) [N17.9]   Acute kidney injury with most recent baseline creatinine of 1.02 on 08/11/20. Acute kidney injury appears multifactorial from dehydration, hypotension, etodolac use and rhabdomyolysis. CK 5597 on admission with creatinine 4.65. Renal US  shows left kidney cyst. Hold NSAIDs. Creatinine has improved but CK has doubled to 11000. Continue high rate IVF.   Lab Results  Component Value Date   CREATININE 1.64 (H) 02/11/2023   CREATININE 4.65 (H) 02/10/2023   CREATININE 1.02 08/11/2020    Intake/Output Summary (Last 24 hours) at 02/11/2023 1209 Last data filed at 02/11/2023 0443 Gross per 24 hour  Intake 1565.91 ml  Output 1500 ml  Net 65.91 ml    2. Hyponatremia, likely secondary to increased free water intake. Corrected to 135   3. Acute metabolic acidosis, S Bicarb 9. Corrected to 25 with sodium bicarbonate  infusion. Will change IVF to normal saline 0.9% at 144ml/hr    4. Hypertension, Home regimen consist of Amlodipine  only. This is currently held, Hydralazine  ordered PRN  Blood pressure remains stable   LOS: 1 Devin Chan 12/31/202412:09 PM

## 2023-02-12 ENCOUNTER — Encounter: Payer: Self-pay | Admitting: Internal Medicine

## 2023-02-12 DIAGNOSIS — N400 Enlarged prostate without lower urinary tract symptoms: Secondary | ICD-10-CM

## 2023-02-12 DIAGNOSIS — N401 Enlarged prostate with lower urinary tract symptoms: Secondary | ICD-10-CM

## 2023-02-12 DIAGNOSIS — M6282 Rhabdomyolysis: Secondary | ICD-10-CM

## 2023-02-12 DIAGNOSIS — I10 Essential (primary) hypertension: Secondary | ICD-10-CM

## 2023-02-12 DIAGNOSIS — N3943 Post-void dribbling: Secondary | ICD-10-CM

## 2023-02-12 DIAGNOSIS — K219 Gastro-esophageal reflux disease without esophagitis: Secondary | ICD-10-CM

## 2023-02-12 HISTORY — DX: Rhabdomyolysis: M62.82

## 2023-02-12 LAB — CBC
HCT: 42.9 % (ref 39.0–52.0)
Hemoglobin: 14.9 g/dL (ref 13.0–17.0)
MCH: 30.9 pg (ref 26.0–34.0)
MCHC: 34.7 g/dL (ref 30.0–36.0)
MCV: 89 fL (ref 80.0–100.0)
Platelets: 275 10*3/uL (ref 150–400)
RBC: 4.82 MIL/uL (ref 4.22–5.81)
RDW: 13.6 % (ref 11.5–15.5)
WBC: 13.2 10*3/uL — ABNORMAL HIGH (ref 4.0–10.5)
nRBC: 0 % (ref 0.0–0.2)

## 2023-02-12 LAB — BASIC METABOLIC PANEL
Anion gap: 13 (ref 5–15)
BUN: 16 mg/dL (ref 6–20)
CO2: 21 mmol/L — ABNORMAL LOW (ref 22–32)
Calcium: 8.5 mg/dL — ABNORMAL LOW (ref 8.9–10.3)
Chloride: 101 mmol/L (ref 98–111)
Creatinine, Ser: 1.05 mg/dL (ref 0.61–1.24)
GFR, Estimated: >60 mL/min (ref >60)
Glucose, Bld: 93 mg/dL (ref 70–99)
Potassium: 3.6 mmol/L (ref 3.5–5.1)
Sodium: 135 mmol/L (ref 135–145)

## 2023-02-12 LAB — CK: Total CK: 10547 U/L — ABNORMAL HIGH (ref 49–397)

## 2023-02-12 MED ORDER — TAMSULOSIN HCL 0.4 MG PO CAPS
0.4000 mg | ORAL_CAPSULE | Freq: Every day | ORAL | Status: DC
  Administered 2023-02-12 – 2023-02-15 (×4): 0.4 mg via ORAL
  Filled 2023-02-12 (×4): qty 1

## 2023-02-12 MED ORDER — AMLODIPINE BESYLATE 10 MG PO TABS
10.0000 mg | ORAL_TABLET | Freq: Every day | ORAL | Status: DC
Start: 2023-02-12 — End: 2023-02-15
  Administered 2023-02-12 – 2023-02-15 (×4): 10 mg via ORAL
  Filled 2023-02-12 (×4): qty 1

## 2023-02-12 MED ORDER — DIAZEPAM 5 MG/ML IJ SOLN
5.0000 mg | Freq: Once | INTRAMUSCULAR | Status: AC
  Administered 2023-02-12: 5 mg via INTRAVENOUS
  Filled 2023-02-12: qty 2

## 2023-02-12 NOTE — Assessment & Plan Note (Addendum)
 Continue Norvasc

## 2023-02-12 NOTE — Progress Notes (Signed)
  Progress Note   Patient: Devin Chan FMW:[id number] DOB: Jul 05, 1965 DOA: 02/10/2023     2 DOS: the patient was seen and examined on 02/12/2023   Brief hospital course: 58 year old man past medical history of hypertension, GERD.  He presented with numerous episodes of nausea vomiting and he was very dehydrated upon coming in.  He was unable to keep anything down.  He was lying in the bathtub for over 24 hours.  Upon presentation to the hospital he was very hemoconcentrated found to be in acute kidney injury and also rhabdomyolysis.  02/12/2023.  Creatinine improved down to 1.05, CK down to 10,547.  Patient complains of difficulty urinating.  Assessment and Plan: * AKI (acute kidney injury) (HCC) Creatinine 4.65 on presentation and down to 1.05.  Rhabdomyolysis CK peaked at 11,138 on 12/31.  Today CK down to 10,547.  Continue IV fluids at a lower rate.  Essential hypertension Restart Norvasc   BPH (benign prostatic hyperplasia) Start Flomax   GERD (gastroesophageal reflux disease) On Protonix         Subjective: Patient feeling little bit better today.  Able to eat a little bit.  Admitted with severe dehydration and acute kidney injury and rhabdomyolysis.  Physical Exam: Vitals:   02/11/23 2020 02/12/23 0023 02/12/23 0420 02/12/23 0805  BP: (!) 136/91 125/88 (!) 173/101 (!) 169/112  Pulse: 82 78 65 87  Resp: 18 18 16 16   Temp: 98.2 F (36.8 C) 98.1 F (36.7 C) 97.9 F (36.6 C) (!) 97.5 F (36.4 C)  TempSrc:   Oral   SpO2: 98% 97% 96% 99%  Weight:      Height:       Physical Exam HENT:     Head: Normocephalic.     Mouth/Throat:     Pharynx: No oropharyngeal exudate.  Eyes:     General: Lids are normal.     Conjunctiva/sclera: Conjunctivae normal.  Cardiovascular:     Rate and Rhythm: Normal rate and regular rhythm.     Heart sounds: Normal heart sounds, S1 normal and S2 normal.  Pulmonary:     Breath sounds: No decreased breath sounds, wheezing, rhonchi or  rales.  Abdominal:     Palpations: Abdomen is soft.     Tenderness: There is no abdominal tenderness.  Musculoskeletal:     Right lower leg: No swelling.     Left lower leg: No swelling.  Skin:    General: Skin is warm.     Findings: No rash.  Neurological:     Mental Status: He is alert and oriented to person, place, and time.     Data Reviewed: Creatinine 1.05, CK 10,547, hemoglobin 14.9, white blood count 13.2  Family Communication: Updated patient's mother on the phone  Disposition: Status is: Inpatient Remains inpatient appropriate because: Continue IV fluid hydration with rhabdomyolysis  Planned Discharge Destination: Home    Time spent: 28 minutes Case discussed with nephrology  Author: Charlie Patterson, MD 02/12/2023 3:07 PM  For on call review [email or web address].

## 2023-02-12 NOTE — Hospital Course (Addendum)
 58 year old man past medical history of hypertension, GERD.  He presented with numerous episodes of nausea vomiting and he was very dehydrated upon coming in.  He was unable to keep anything down.  He was lying in the bathtub for over 24 hours.  Upon presentation to the hospital he was very hemoconcentrated found to be in acute kidney injury and also rhabdomyolysis.  02/12/2023.  Creatinine improved down to 1.05, CK down to 10,547.  Patient complains of difficulty urinating.  Restarted Norvasc  for blood pressure. 1/2.  CK down to 6906, creatinine 1.04.  Add Toprol  for blood pressure. 1/3.  CK down to 5133.  Continue IV fluid 1/4  CK down to 2824.  Patient tolerating diet and felling okay.  No further muscle aches and pains.  Stable for discharge and must eat and drink to stay hydrated at home.

## 2023-02-12 NOTE — Assessment & Plan Note (Addendum)
 -  Continue Flomax

## 2023-02-12 NOTE — Assessment & Plan Note (Signed)
 On Protonix

## 2023-02-12 NOTE — Plan of Care (Signed)

## 2023-02-12 NOTE — Assessment & Plan Note (Addendum)
 CK peaked at 11,138 on 12/31.  Today CK down to 2824.  We completed the bag of ivf that was running before discharge today.  NO further aches and pains and was tolerating diet.  Advised to stay hydrated. TSH normal.

## 2023-02-12 NOTE — Assessment & Plan Note (Addendum)
 Creatinine 4.65 on presentation and down to 1.04.

## 2023-02-12 NOTE — Progress Notes (Signed)
 [facility]  ROUNDING NOTE   Subjective:   Walking around the room IVF: NS at 150mL/hr  Complians of polyuria and some difficulty with emptying.   Patient states he is starting to feel better.   Creatinine 1.05 (1.64) UOP not recorded CK   Creatinine 1.64 from 4.65 [place] 105[address] (11138)  Objective:  Vital signs in last 24 hours:  Temp:  [97.5 F (36.4 C)-98.3 F (36.8 C)] 97.5 F (36.4 C) (01/01 0805) Pulse Rate:  [65-88] 87 (01/01 0805) Resp:  [16-18] 16 (01/01 0805) BP: (112-173)/(75-112) 169/112 (01/01 0805) SpO2:  [95 %-99 %] 99 % (01/01 0805)  Weight change:  Filed Weights   02/10/23 0525  Weight: 59 kg    Intake/Output: I/O last 3 completed shifts: In: 3314.9 [I.V.:3314.9] Out: 950 [Urine:950]   Intake/Output this shift:  No intake/output data recorded.  Physical Exam: General: NAD  Head: Normocephalic, atraumatic. Moist oral mucosal membranes  Eyes: Anicteric  Lungs:  Clear to auscultation, normal effort  Heart: Regular rate and rhythm  Abdomen:  Soft, nontender,   Extremities:  No peripheral edema.  Neurologic: Alert, moving all four extremities  Skin: No lesions       Basic Metabolic Panel: Recent Labs  Lab 02/10/23 0529 02/11/23 0434 02/12/23 0437  NA 129* 135 135  K 3.5 3.5 3.6  CL 93* 99 101  CO2 9* 25 21*  GLUCOSE 160* 122* 93  BUN 44* 35* 16  CREATININE 4.65* 1.64* 1.05  CALCIUM 9.6 8.5* 8.5*    Liver Function Tests: Recent Labs  Lab 02/10/23 0529  AST 47*  ALT 24  ALKPHOS 71  BILITOT 0.9  PROT 8.9*  ALBUMIN 5.2*   No results for input(s): LIPASE, AMYLASE in the last 168 hours. No results for input(s): AMMONIA in the last 168 hours.  CBC: Recent Labs  Lab 02/10/23 0529 02/11/23 0434 02/12/23 0437  WBC 22.9* 17.1* 13.2*  NEUTROABS 20.1*  --   --   HGB 18.4* 15.1 14.9  HCT 52.9* 42.7 42.9  MCV 87.7 87.3 89.0  PLT 401* 311 275    Cardiac Enzymes: Recent Labs  Lab  02/10/23 1237 02/11/23 0434 02/12/23 0437  CKTOTAL 5,597* 11,138* 10,547*    BNP: Invalid input(s): POCBNP  CBG: No results for input(s): GLUCAP in the last 168 hours.  Microbiology: Results for orders placed or performed during the hospital encounter of 08/09/20  Resp Panel by RT-PCR (Flu A&B, Covid) Nasopharyngeal Swab     Status: None   Collection Time: 08/09/20 11:47 AM   Specimen: Nasopharyngeal Swab; Nasopharyngeal(NP) swabs in vial transport medium  Result Value Ref Range Status   SARS Coronavirus 2 by RT PCR NEGATIVE NEGATIVE Final    Comment: (NOTE) SARS-CoV-2 target nucleic acids are NOT DETECTED.  The SARS-CoV-2 RNA is generally detectable in upper respiratory specimens during the acute phase of infection. The lowest concentration of SARS-CoV-2 viral copies this assay can detect is 138 copies/mL. A negative result does not preclude SARS-Cov-2 infection and should not be used as the sole basis for treatment or other patient management decisions. A negative result may occur with  improper specimen collection/handling, submission of specimen other than nasopharyngeal swab, presence of viral mutation(s) within the areas targeted by this assay, and inadequate number of viral copies(<138 copies/mL). A negative result must be combined with clinical observations, patient history, and epidemiological information. The expected result is Negative.  Fact Sheet for Patients:  bloggercourse.com  Fact Sheet for Healthcare Providers:  seriousbroker.it  This test is no t yet approved or cleared by the [place]  FDA and  has been authorized for detection and/or diagnosis of SARS-CoV-2 by FDA under an Emergency Use Authorization (EUA). This EUA will remain  in effect (meaning this test can be used) for the duration of the COVID-19 declaration under Section 564(b)(1) of the Act, 21 U.S.C.section 360bbb-3(b)(1), unless the  authorization is terminated  or revoked sooner.       Influenza A by PCR NEGATIVE NEGATIVE Final   Influenza B by PCR NEGATIVE NEGATIVE Final    Comment: (NOTE) The Xpert Xpress SARS-CoV-2/FLU/RSV plus assay is intended as an aid in the diagnosis of influenza from Nasopharyngeal swab specimens and should not be used as a sole basis for treatment. Nasal washings and aspirates are unacceptable for Xpert Xpress SARS-CoV-2/FLU/RSV testing.  Fact Sheet for Patients: bloggercourse.com  Fact Sheet for Healthcare Providers: seriousbroker.it  This test is not yet approved or cleared by the [place]  FDA and has been authorized for detection and/or diagnosis of SARS-CoV-2 by FDA under an Emergency Use Authorization (EUA). This EUA will remain in effect (meaning this test can be used) for the duration of the COVID-19 declaration under Section 564(b)(1) of the Act, 21 U.S.C. section 360bbb-3(b)(1), unless the authorization is terminated or revoked.  Performed at [facility], [address] [place], [place] [address]   CULTURE, BLOOD (ROUTINE X 2) w Reflex to ID Panel     Status: None   Collection Time: 08/09/20 11:48 AM   Specimen: BLOOD  Result Value Ref Range Status   Specimen Description BLOOD LEFT ARM  Final   Special Requests   Final    BOTTLES DRAWN AEROBIC AND ANAEROBIC Blood Culture results may not be optimal due to an inadequate volume of blood received in culture bottles   Culture   Final    NO GROWTH 5 DAYS Performed at [facility], [address] [place], [place] [address]    Report Status 08/14/2020 FINAL  Final  CULTURE, BLOOD (ROUTINE X 2) w Reflex to ID Panel     Status: None   Collection Time: 08/09/20  5:57 PM   Specimen: BLOOD  Result Value Ref Range Status   Specimen Description BLOOD LEFT ANTECUBITAL  Final   Special Requests   Final    BOTTLES DRAWN AEROBIC AND ANAEROBIC Blood  Culture adequate volume   Culture   Final    NO GROWTH 5 DAYS Performed at [facility], [address] [place], [place] [address]    Report Status 08/14/2020 FINAL  Final    Coagulation Studies: No results for input(s): LABPROT, INR in the last 72 hours.  Urinalysis: Recent Labs    02/10/23 1311  COLORURINE YELLOW*  LABSPEC 1.017  PHURINE 5.0  GLUCOSEU NEGATIVE  HGBUR LARGE*  BILIRUBINUR NEGATIVE  KETONESUR NEGATIVE  PROTEINUR 100*  NITRITE NEGATIVE  LEUKOCYTESUR NEGATIVE      Imaging: US  RENAL Result Date: 02/10/2023 CLINICAL DATA:  [id number] AKI (acute kidney injury) (HCC) 409830 EXAM: RENAL / URINARY TRACT ULTRASOUND COMPLETE COMPARISON:  None Available. FINDINGS: The right kidney measured 10.5 cm and the left kidney measured 11.7 cm. The kidneys demonstrate increased echogenicity consistent with chronic medical renal disease. Left kidney cyst measuring 1.1 cm. No shadowing stones are seen. No hydronephrosis identified. Bladder: Appears normal for degree of bladder distention. IMPRESSION: Echogenic kidneys consistent with chronic medical renal disease. Left kidney cyst. Electronically Signed   By: Fonda  Pleasure  M.D.   On: 02/10/2023 13:31   DG Chest Port 1 View Result Date: 02/10/2023 CLINICAL DATA:  Weakness, dehydration, hypotensive EXAM: PORTABLE CHEST - 1 VIEW COMPARISON:  04/02/2019 FINDINGS: Lungs are clear. Heart size and mediastinal contours are within normal limits. No effusion. Visualized bones unremarkable. IMPRESSION: No acute cardiopulmonary disease. Electronically Signed   By: JONETTA Faes M.D.   On: 02/10/2023 12:38     Medications:    sodium chloride  150 mL/hr at 02/12/23 0100    amLODipine   10 mg Oral Daily   feeding supplement  237 mL Oral BID BM   heparin   5,000 Units Subcutaneous Q12H   pantoprazole   20 mg Oral Daily   tamsulosin   0.4 mg Oral Daily   albuterol , hydrALAZINE , ondansetron  **OR** ondansetron  (ZOFRAN ) IV  Assessment/  Plan:  Mr. Devin Chan is a 58 y.o.  male with past medical conditions including GERD, hypertension, arthritis, and asthma, who was admitted to [facility] on 02/10/2023 for AKI (acute kidney injury) (HCC) [N17.9] Acute renal failure, unspecified acute renal failure type (HCC) [N17.9]   Acute kidney injury with most recent baseline creatinine of 1.02 on 08/11/20. Acute kidney injury appears multifactorial from dehydration, hypotension, etodolac use and rhabdomyolysis.  - Hold NSAIDs.  - Continue IVF  - supportive care  Lab Results  Component Value Date   CREATININE 1.05 02/12/2023   CREATININE 1.64 (H) 02/11/2023   CREATININE 4.65 (H) 02/10/2023    Intake/Output Summary (Last 24 hours) at 02/12/2023 1101 Last data filed at 02/11/2023 1648 Gross per 24 hour  Intake 1749.01 ml  Output --  Net 1749.01 ml    2. Hyponatremia, likely secondary to increased free water intake. Corrected to 135   3. Acute metabolic acidosis,: improved. Anion gap has closed.    4. Hypertension - amlodipine  restarted.    LOS: 2 Alette Kataoka 1/1/202511:01 AM

## 2023-02-12 NOTE — TOC Initial Note (Signed)
 Transition of Care [facility]) - Initial/Assessment Note    Patient Details  Name: Devin Chan MRN: [id number] Date of Birth: 1965-09-03  [facility]) CM/SW Contact:    Ladene Lady, LCSW Phone Number: 02/12/2023, 10:36 AM  Clinical Narrative:    Pt admitted from home with AKI. Pt independent with mobility. Pt has PCP [facility] and lives alone currently. Pt is uninsured. CSW will see if pt is able to get meds at [facility] pharmacy closer to discharge. No needs as of current.                Expected Discharge Plan: Home/Self Care Barriers to Discharge: Continued Medical Work up   Patient Goals and CMS Choice Patient states their goals for this hospitalization and ongoing recovery are:: return hom CMS Medicare.gov Compare Post Acute Care list provided to:: Patient        Expected Discharge Plan and Services                                              Prior Living Arrangements/Services   Lives with:: Self          Need for Family Participation in Patient Care: No (Comment)        Activities of Daily Living   ADL Screening (condition at time of admission) Independently performs ADLs?: Yes (appropriate for developmental age) Is the patient deaf or have difficulty hearing?: No Does the patient have difficulty seeing, even when wearing glasses/contacts?: No Does the patient have difficulty concentrating, remembering, or making decisions?: No  Permission Sought/Granted                  Emotional Assessment     Affect (typically observed): Accepting Orientation: : Oriented to Self, Oriented to Place, Oriented to  Time, Oriented to Situation      Admission diagnosis:  AKI (acute kidney injury) (HCC) [N17.9] Acute renal failure, unspecified acute renal failure type (HCC) [N17.9] Patient Active Problem List   Diagnosis Date Noted   AKI (acute kidney injury) (HCC) 02/10/2023   SIRS (systemic inflammatory response  syndrome) (HCC) 08/09/2020   Asthma 08/09/2020   Gastritis 08/09/2020   Hypercalcemia 08/09/2020   Acute superficial gastritis without hemorrhage    Barrett's esophagus determined by endoscopy    Intractable vomiting    Elevated LFTs    Essential hypertension    Leukocytosis    Elevated total protein    Cannabinoid hyperemesis syndrome 04/02/2019   Acute renal failure superimposed on stage 3a chronic kidney disease (HCC)    High anion gap metabolic acidosis    Dehydration    Marijuana abuse    Upper GI bleed 01/02/2014   Hematemesis 01/02/2014   Epigastric pain    PCP:  Practice, [facility]:   [facility] - [place], [place] - [address] [place] [address] Phone: [phone] Fax: [phone]     Social Drivers of Health (SDOH) Social History: SDOH Screenings   Food Insecurity: No Food Insecurity (02/10/2023)  Housing: Low Risk  (02/10/2023)  Transportation Needs: No Transportation Needs (02/10/2023)  Utilities: Not At Risk (02/10/2023)  Social Connections: Socially Isolated (02/10/2023)  Tobacco Use: High Risk (02/10/2023)   SDOH Interventions:     Readmission Risk Interventions     No data to display

## 2023-02-13 DIAGNOSIS — R350 Frequency of micturition: Secondary | ICD-10-CM

## 2023-02-13 LAB — BASIC METABOLIC PANEL
Anion gap: 10 (ref 5–15)
BUN: 17 mg/dL (ref 6–20)
CO2: 22 mmol/L (ref 22–32)
Calcium: 8.8 mg/dL — ABNORMAL LOW (ref 8.9–10.3)
Chloride: 99 mmol/L (ref 98–111)
Creatinine, Ser: 1.04 mg/dL (ref 0.61–1.24)
GFR, Estimated: >60 mL/min (ref >60)
Glucose, Bld: 103 mg/dL — ABNORMAL HIGH (ref 70–99)
Potassium: 3.8 mmol/L (ref 3.5–5.1)
Sodium: 131 mmol/L — ABNORMAL LOW (ref 135–145)

## 2023-02-13 LAB — CBC
HCT: 42 % (ref 39.0–52.0)
Hemoglobin: 14.6 g/dL (ref 13.0–17.0)
MCH: 30.5 pg (ref 26.0–34.0)
MCHC: 34.8 g/dL (ref 30.0–36.0)
MCV: 87.7 fL (ref 80.0–100.0)
Platelets: 278 10*3/uL (ref 150–400)
RBC: 4.79 MIL/uL (ref 4.22–5.81)
RDW: 13.2 % (ref 11.5–15.5)
WBC: 11.8 10*3/uL — ABNORMAL HIGH (ref 4.0–10.5)
nRBC: 0 % (ref 0.0–0.2)

## 2023-02-13 LAB — CK: Total CK: 6906 U/L — ABNORMAL HIGH (ref 49–397)

## 2023-02-13 LAB — GLUCOSE, CAPILLARY: Glucose-Capillary: 103 mg/dL — ABNORMAL HIGH (ref 70–99)

## 2023-02-13 MED ORDER — METOPROLOL SUCCINATE ER 25 MG PO TB24
25.0000 mg | ORAL_TABLET | Freq: Every day | ORAL | Status: DC
  Administered 2023-02-13 – 2023-02-14 (×2): 25 mg via ORAL
  Filled 2023-02-13 (×2): qty 1

## 2023-02-13 NOTE — Progress Notes (Signed)
 [facility]  ROUNDING NOTE   Subjective:   Patient resting quietly in bed Alert and oriented Appetite appropriate  Creatinine 1.04 CK 6909  Objective:  Vital signs in last 24 hours:  Temp:  [97.8 F (36.6 C)-98.3 F (36.8 C)] 98.1 F (36.7 C) (01/02 0845) Pulse Rate:  [88-105] 105 (01/02 0844) Resp:  [16-18] 16 (01/02 0845) BP: (95-172)/(61-102) 150/100 (01/02 0845) SpO2:  [97 %-100 %] 97 % (01/02 0845)  Weight change:  Filed Weights   02/10/23 0525  Weight: 59 kg    Intake/Output: I/O last 3 completed shifts: In: 3044.6 [P.O.:240; I.V.:2804.6] Out: -    Intake/Output this shift:  No intake/output data recorded.  Physical Exam: General: NAD  Head: Normocephalic, atraumatic. Moist oral mucosal membranes  Eyes: Anicteric  Lungs:  Clear to auscultation, normal effort  Heart: Regular rate and rhythm  Abdomen:  Soft, nontender,   Extremities:  No peripheral edema.  Neurologic: Alert, moving all four extremities  Skin: No lesions       Basic Metabolic Panel: Recent Labs  Lab 02/10/23 0529 02/11/23 0434 02/12/23 0437 02/13/23 0605  NA 129* 135 135 131*  K 3.5 3.5 3.6 3.8  CL 93* 99 101 99  CO2 9* 25 21* 22  GLUCOSE 160* 122* 93 103*  BUN 44* 35* 16 17  CREATININE 4.65* 1.64* 1.05 1.04  CALCIUM 9.6 8.5* 8.5* 8.8*    Liver Function Tests: Recent Labs  Lab 02/10/23 0529  AST 47*  ALT 24  ALKPHOS 71  BILITOT 0.9  PROT 8.9*  ALBUMIN 5.2*   No results for input(s): LIPASE, AMYLASE in the last 168 hours. No results for input(s): AMMONIA in the last 168 hours.  CBC: Recent Labs  Lab 02/10/23 0529 02/11/23 0434 02/12/23 0437 02/13/23 0605  WBC 22.9* 17.1* 13.2* 11.8*  NEUTROABS 20.1*  --   --   --   HGB 18.4* 15.1 14.9 14.6  HCT 52.9* 42.7 42.9 42.0  MCV 87.7 87.3 89.0 87.7  PLT 401* 311 275 278    Cardiac Enzymes: Recent Labs  Lab 02/10/23 1237 02/11/23 0434 02/12/23 0437 02/13/23 0605  CKTOTAL 5,597* 11,138*  10,547* 6,906*    BNP: Invalid input(s): POCBNP  CBG: Recent Labs  Lab 02/13/23 0122  GLUCAP 103*    Microbiology: Results for orders placed or performed during the hospital encounter of 08/09/20  Resp Panel by RT-PCR (Flu A&B, Covid) Nasopharyngeal Swab     Status: None   Collection Time: 08/09/20 11:47 AM   Specimen: Nasopharyngeal Swab; Nasopharyngeal(NP) swabs in vial transport medium  Result Value Ref Range Status   SARS Coronavirus 2 by RT PCR NEGATIVE NEGATIVE Final    Comment: (NOTE) SARS-CoV-2 target nucleic acids are NOT DETECTED.  The SARS-CoV-2 RNA is generally detectable in upper respiratory specimens during the acute phase of infection. The lowest concentration of SARS-CoV-2 viral copies this assay can detect is 138 copies/mL. A negative result does not preclude SARS-Cov-2 infection and should not be used as the sole basis for treatment or other patient management decisions. A negative result may occur with  improper specimen collection/handling, submission of specimen other than nasopharyngeal swab, presence of viral mutation(s) within the areas targeted by this assay, and inadequate number of viral copies(<138 copies/mL). A negative result must be combined with clinical observations, patient history, and epidemiological information. The expected result is Negative.  Fact Sheet for Patients:  bloggercourse.com  Fact Sheet for Healthcare Providers:  seriousbroker.it  This test is no t  yet approved or cleared by the [place]  FDA and  has been authorized for detection and/or diagnosis of SARS-CoV-2 by FDA under an Emergency Use Authorization (EUA). This EUA will remain  in effect (meaning this test can be used) for the duration of the COVID-19 declaration under Section 564(b)(1) of the Act, 21 U.S.C.section 360bbb-3(b)(1), unless the authorization is terminated  or revoked sooner.       Influenza A  by PCR NEGATIVE NEGATIVE Final   Influenza B by PCR NEGATIVE NEGATIVE Final    Comment: (NOTE) The Xpert Xpress SARS-CoV-2/FLU/RSV plus assay is intended as an aid in the diagnosis of influenza from Nasopharyngeal swab specimens and should not be used as a sole basis for treatment. Nasal washings and aspirates are unacceptable for Xpert Xpress SARS-CoV-2/FLU/RSV testing.  Fact Sheet for Patients: bloggercourse.com  Fact Sheet for Healthcare Providers: seriousbroker.it  This test is not yet approved or cleared by the [place]  FDA and has been authorized for detection and/or diagnosis of SARS-CoV-2 by FDA under an Emergency Use Authorization (EUA). This EUA will remain in effect (meaning this test can be used) for the duration of the COVID-19 declaration under Section 564(b)(1) of the Act, 21 U.S.C. section 360bbb-3(b)(1), unless the authorization is terminated or revoked.  Performed at [facility], [address] [place], [place] [address]   CULTURE, BLOOD (ROUTINE X 2) w Reflex to ID Panel     Status: None   Collection Time: 08/09/20 11:48 AM   Specimen: BLOOD  Result Value Ref Range Status   Specimen Description BLOOD LEFT ARM  Final   Special Requests   Final    BOTTLES DRAWN AEROBIC AND ANAEROBIC Blood Culture results may not be optimal due to an inadequate volume of blood received in culture bottles   Culture   Final    NO GROWTH 5 DAYS Performed at [facility], [address] [place], [place] [address]    Report Status 08/14/2020 FINAL  Final  CULTURE, BLOOD (ROUTINE X 2) w Reflex to ID Panel     Status: None   Collection Time: 08/09/20  5:57 PM   Specimen: BLOOD  Result Value Ref Range Status   Specimen Description BLOOD LEFT ANTECUBITAL  Final   Special Requests   Final    BOTTLES DRAWN AEROBIC AND ANAEROBIC Blood Culture adequate volume   Culture   Final    NO GROWTH 5 DAYS Performed  at [facility], [address], [place], [place] [address]    Report Status 08/14/2020 FINAL  Final    Coagulation Studies: No results for input(s): LABPROT, INR in the last 72 hours.  Urinalysis: Recent Labs    02/10/23 1311  COLORURINE YELLOW*  LABSPEC 1.017  PHURINE 5.0  GLUCOSEU NEGATIVE  HGBUR LARGE*  BILIRUBINUR NEGATIVE  KETONESUR NEGATIVE  PROTEINUR 100*  NITRITE NEGATIVE  LEUKOCYTESUR NEGATIVE      Imaging: No results found.    Medications:    sodium chloride  100 mL/hr at 02/13/23 9742    amLODipine   10 mg Oral Daily   feeding supplement  237 mL Oral BID BM   heparin   5,000 Units Subcutaneous Q12H   pantoprazole   20 mg Oral Daily   tamsulosin   0.4 mg Oral Daily   albuterol , hydrALAZINE , ondansetron  **OR** ondansetron  (ZOFRAN ) IV  Assessment/ Plan:  Mr. Devin Chan is a 58 y.o.  male with past medical conditions including GERD, hypertension, arthritis, and asthma, who was admitted to [facility] on 02/10/2023  for AKI (acute kidney injury) (HCC) [N17.9] Acute renal failure, unspecified acute renal failure type (HCC) [N17.9]   Acute kidney injury with most recent baseline creatinine of 1.02 on 08/11/20. Acute kidney injury appears multifactorial from dehydration, hypotension, etodolac use and rhabdomyolysis.  - Hold NSAIDs.  - Renal function has returned to baseline - Continue IVF   Lab Results  Component Value Date   CREATININE 1.04 02/13/2023   CREATININE 1.05 02/12/2023   CREATININE 1.64 (H) 02/11/2023    Intake/Output Summary (Last 24 hours) at 02/13/2023 1154 Last data filed at 02/13/2023 0300 Gross per 24 hour  Intake 3044.56 ml  Output --  Net 3044.56 ml    2. Hyponatremia, likely secondary to increased free water intake. Decreased sodium 131   3. Acute metabolic acidosis,: improved. Anion gap has closed.    4. Hypertension - amlodipine  restarted.   Due to renal recovery, we will sign off at this time   LOS: 3 Mystie Ormand 1/2/202511:54 AM

## 2023-02-13 NOTE — Plan of Care (Signed)

## 2023-02-13 NOTE — Progress Notes (Signed)
  Progress Note   Patient: Devin Chan FMW:[id number] DOB: [birth year] DOA: 02/10/2023     3 DOS: the patient was seen and examined on 02/13/2023   Brief hospital course: 58 year old man past medical history of hypertension, GERD.  He presented with numerous episodes of nausea vomiting and he was very dehydrated upon coming in.  He was unable to keep anything down.  He was lying in the bathtub for over 24 hours.  Upon presentation to the hospital he was very hemoconcentrated found to be in acute kidney injury and also rhabdomyolysis.  02/12/2023.  Creatinine improved down to 1.05, CK down to 10,547.  Patient complains of difficulty urinating.  Restarted Norvasc  for blood pressure. 1/2.  CK down to 6906, creatinine 1.04.  Add Toprol  for blood pressure.  Assessment and Plan: * Rhabdomyolysis CK peaked at 11,138 on 12/31.  Today CK down to 6906.  Continue IV fluids.  Recheck CK tomorrow.  Check a TSH.  AKI (acute kidney injury) (HCC) Creatinine 4.65 on presentation and down to 1.04.  Essential hypertension Restarted Norvasc  yesterday and will add Toprol  today.  BPH (benign prostatic hyperplasia) Continue Flomax .  Patient felt the difference with his urination yesterday.  GERD (gastroesophageal reflux disease) On Protonix         Subjective: Patient states he is doing better with the Flomax  with his urination.  Patient's muscle enzymes are down to 6906.  Patient starting to eat a little bit better.  Initially came in with nausea vomiting found to be in acute kidney injury and rhabdomyolysis.  Physical Exam: Vitals:   02/13/23 0136 02/13/23 0500 02/13/23 0844 02/13/23 0845  BP: (!) 138/92 (!) 147/91 (!) 150/100 (!) 150/100  Pulse:  88 (!) 105   Resp:  16 16 16   Temp:  97.8 F (36.6 C) 98.1 F (36.7 C) 98.1 F (36.7 C)  TempSrc:  Oral    SpO2:  100% 98% 97%  Weight:      Height:       Physical Exam HENT:     Head: Normocephalic.     Mouth/Throat:     Pharynx: No  oropharyngeal exudate.  Eyes:     General: Lids are normal.     Conjunctiva/sclera: Conjunctivae normal.  Cardiovascular:     Rate and Rhythm: Normal rate and regular rhythm.     Heart sounds: Normal heart sounds, S1 normal and S2 normal.  Pulmonary:     Breath sounds: No decreased breath sounds, wheezing, rhonchi or rales.  Abdominal:     Palpations: Abdomen is soft.     Tenderness: There is no abdominal tenderness.  Musculoskeletal:     Right lower leg: No swelling.     Left lower leg: No swelling.  Skin:    General: Skin is warm.     Findings: No rash.  Neurological:     Mental Status: He is alert and oriented to person, place, and time.     Data Reviewed: Creatinine 1.04, CK 6906  Family Communication: Updated mother on the phone  Disposition: Status is: Inpatient Remains inpatient appropriate because:   Planned Discharge Destination: Home    Time spent: 28 minutes  Author: Charlie Patterson, MD 02/13/2023 4:22 PM  For on call review [email or web address].

## 2023-02-14 LAB — CBC
HCT: 38.2 % — ABNORMAL LOW (ref 39.0–52.0)
Hemoglobin: 13 g/dL (ref 13.0–17.0)
MCH: 31.3 pg (ref 26.0–34.0)
MCHC: 34 g/dL (ref 30.0–36.0)
MCV: 92 fL (ref 80.0–100.0)
Platelets: 252 10*3/uL (ref 150–400)
RBC: 4.15 MIL/uL — ABNORMAL LOW (ref 4.22–5.81)
RDW: 13.2 % (ref 11.5–15.5)
WBC: 8.9 10*3/uL (ref 4.0–10.5)
nRBC: 0 % (ref 0.0–0.2)

## 2023-02-14 LAB — TSH: TSH: 1.463 u[IU]/mL (ref 0.350–4.500)

## 2023-02-14 LAB — CK: Total CK: 5133 U/L — ABNORMAL HIGH (ref 49–397)

## 2023-02-14 MED ORDER — METOPROLOL SUCCINATE ER 25 MG PO TB24: 12.5000 mg | ORAL_TABLET | Freq: Every day | ORAL | Status: DC

## 2023-02-14 NOTE — Progress Notes (Signed)
  Progress Note   Patient: Devin Chan FMW:[id number] DOB: Nov 13, 1965 DOA: 02/10/2023     4 DOS: the patient was seen and examined on 02/14/2023   Brief hospital course: 58 year old man past medical history of hypertension, GERD.  He presented with numerous episodes of nausea vomiting and he was very dehydrated upon coming in.  He was unable to keep anything down.  He was lying in the bathtub for over 24 hours.  Upon presentation to the hospital he was very hemoconcentrated found to be in acute kidney injury and also rhabdomyolysis.  02/12/2023.  Creatinine improved down to 1.05, CK down to 10,547.  Patient complains of difficulty urinating.  Restarted Norvasc  for blood pressure. 1/2.  CK down to 6906, creatinine 1.04.  Add Toprol  for blood pressure. 1/3.  CK down to 5133.  Continue IV fluid  Assessment and Plan: * Rhabdomyolysis CK peaked at 11,138 on 12/31.  Today CK down to 5133.  Continue IV fluids.  Recheck CK tomorrow.  TSH normal.  AKI (acute kidney injury) (HCC) Creatinine 4.65 on presentation and down to 1.04.  Essential hypertension Continue Norvasc .  Blood pressure did dip down quite a bit we will hold off on Toprol .  BPH (benign prostatic hyperplasia) Continue Flomax .   GERD (gastroesophageal reflux disease) On Protonix         Subjective: Patient feeling better today.  Eating better.  No nausea or vomiting.  Muscle aches and pains are less.  Admitted with acute kidney injury and rhabdomyolysis.  Physical Exam: Vitals:   02/13/23 1749 02/13/23 1948 02/14/23 0441 02/14/23 0815  BP: (!) 143/96 112/84 124/73 109/72  Pulse: 84 81 71 (!) 59  Resp: 16 19 19    Temp: 98.4 F (36.9 C) 98.4 F (36.9 C) 97.7 F (36.5 C) 98.2 F (36.8 C)  TempSrc: Oral Oral Oral   SpO2: 98% 98% 98% 100%  Weight:      Height:       Physical Exam HENT:     Head: Normocephalic.  Eyes:     General: Lids are normal.     Conjunctiva/sclera: Conjunctivae normal.  Cardiovascular:      Rate and Rhythm: Normal rate and regular rhythm.     Heart sounds: Normal heart sounds, S1 normal and S2 normal.  Pulmonary:     Breath sounds: No decreased breath sounds, wheezing, rhonchi or rales.  Abdominal:     Palpations: Abdomen is soft.     Tenderness: There is no abdominal tenderness.  Musculoskeletal:     Right lower leg: No swelling.     Left lower leg: No swelling.  Skin:    General: Skin is warm.     Findings: No rash.  Neurological:     Mental Status: He is alert and oriented to person, place, and time.     Data Reviewed: CK 5133, TSH normal  Family Communication: Updated mother on the phone  Disposition: Status is: Inpatient Remains inpatient appropriate because: Continue IV fluids today to try to get muscle enzyme better.  Planned Discharge Destination: Home    Time spent: 27 minutes  Author: Charlie Patterson, MD 02/14/2023 3:06 PM  For on call review [email or web address].

## 2023-02-15 LAB — CK: Total CK: 2824 U/L — ABNORMAL HIGH (ref 49–397)

## 2023-02-15 MED ORDER — TAMSULOSIN HCL 0.4 MG PO CAPS: 0.4000 mg | ORAL_CAPSULE | Freq: Every day | ORAL | 0 refills | Status: AC

## 2023-02-15 NOTE — Discharge Summary (Signed)
 Physician Discharge Summary   Patient: Devin Chan MRN: [id number] DOB: 1966-01-20  Admit date:     02/10/2023  Discharge date: 02/15/23  Discharge Physician: Charlie Patterson   PCP: [facility]   Recommendations at discharge:    Follow up pcp 5 days  Discharge Diagnoses: Principal Problem:   Rhabdomyolysis Active Problems:   AKI (acute kidney injury) (HCC)   Essential hypertension   BPH (benign prostatic hyperplasia)   GERD (gastroesophageal reflux disease)   Hospital Course: 58 year old man past medical history of hypertension, GERD.  He presented with numerous episodes of nausea vomiting and he was very dehydrated upon coming in.  He was unable to keep anything down.  He was lying in the bathtub for over 24 hours.  Upon presentation to the hospital he was very hemoconcentrated found to be in acute kidney injury and also rhabdomyolysis.  02/12/2023.  Creatinine improved down to 1.05, CK down to 10,547.  Patient complains of difficulty urinating.  Restarted Norvasc  for blood pressure. 1/2.  CK down to 6906, creatinine 1.04.  Add Toprol  for blood pressure. 1/3.  CK down to 5133.  Continue IV fluid 1/4  CK down to 2824.  Patient tolerating diet and felling okay.  No further muscle aches and pains.  Stable for discharge and must eat and drink to stay hydrated at home.  Assessment and Plan: * Rhabdomyolysis CK peaked at 11,138 on 12/31.  Today CK down to 2824.  We completed the bag of ivf that was running before discharge today.  NO further aches and pains and was tolerating diet.  Advised to stay hydrated. TSH normal.  AKI (acute kidney injury) (HCC) Creatinine 4.65 on presentation and down to 1.04.  Essential hypertension Continue Norvasc .    BPH (benign prostatic hyperplasia) Continue Flomax .   GERD (gastroesophageal reflux disease) On Protonix          Consultants: Nephrology Procedures performed: none  Disposition: Home Diet  recommendation:  Cardiac diet DISCHARGE MEDICATION: Allergies as of 02/15/2023       Reactions   Morphine  And Codeine Other (See Comments)   Morphine  doesn't work but hydromorphone  does        Medication List     TAKE these medications    albuterol  108 (90 Base) MCG/ACT inhaler Commonly known as: VENTOLIN  HFA Inhale 2 puffs into the lungs every 6 (six) hours as needed for wheezing or shortness of breath.   amLODipine  10 MG tablet Commonly known as: NORVASC  Take 1 tablet (10 mg total) by mouth daily.   pantoprazole  20 MG tablet Commonly known as: PROTONIX  Take 20 mg by mouth daily.   tamsulosin  0.4 MG Caps capsule Commonly known as: FLOMAX  Take 1 capsule (0.4 mg total) by mouth daily.        Follow-up Information     [facility] Follow up in 5 day(s).   Specialty: Family Medicine Contact information: [address] [place] [address] [phone]                Discharge Exam: Fredricka Weights   02/10/23 0525  Weight: 59 kg   Physical Exam HENT:     Head: Normocephalic.  Eyes:     General: Lids are normal.     Conjunctiva/sclera: Conjunctivae normal.  Cardiovascular:     Rate and Rhythm: Normal rate and regular rhythm.     Heart sounds: Normal heart sounds, S1 normal and S2 normal.  Pulmonary:     Breath sounds: No  decreased breath sounds, wheezing, rhonchi or rales.  Abdominal:     Palpations: Abdomen is soft.     Tenderness: There is no abdominal tenderness.  Musculoskeletal:     Right lower leg: No swelling.     Left lower leg: No swelling.  Skin:    General: Skin is warm.     Findings: No rash.  Neurological:     Mental Status: He is alert and oriented to person, place, and time.      Condition at discharge: stable  The results of significant diagnostics from this hospitalization (including imaging, microbiology, ancillary and laboratory) are listed below for reference.   Imaging Studies: US   RENAL Result Date: 02/10/2023 CLINICAL DATA:  [id number] AKI (acute kidney injury) (HCC) [id number] EXAM: RENAL / URINARY TRACT ULTRASOUND COMPLETE COMPARISON:  None Available. FINDINGS: The right kidney measured 10.5 cm and the left kidney measured 11.7 cm. The kidneys demonstrate increased echogenicity consistent with chronic medical renal disease. Left kidney cyst measuring 1.1 cm. No shadowing stones are seen. No hydronephrosis identified. Bladder: Appears normal for degree of bladder distention. IMPRESSION: Echogenic kidneys consistent with chronic medical renal disease. Left kidney cyst. Electronically Signed   By: Fonda Field M.D.   On: 02/10/2023 13:31   DG Chest Port 1 View Result Date: 02/10/2023 CLINICAL DATA:  Weakness, dehydration, hypotensive EXAM: PORTABLE CHEST - 1 VIEW COMPARISON:  04/02/2019 FINDINGS: Lungs are clear. Heart size and mediastinal contours are within normal limits. No effusion. Visualized bones unremarkable. IMPRESSION: No acute cardiopulmonary disease. Electronically Signed   By: JONETTA Faes M.D.   On: 02/10/2023 12:38    Microbiology: Results for orders placed or performed during the hospital encounter of 08/09/20  Resp Panel by RT-PCR (Flu A&B, Covid) Nasopharyngeal Swab     Status: None   Collection Time: 08/09/20 11:47 AM   Specimen: Nasopharyngeal Swab; Nasopharyngeal(NP) swabs in vial transport medium  Result Value Ref Range Status   SARS Coronavirus 2 by RT PCR NEGATIVE NEGATIVE Final    Comment: (NOTE) SARS-CoV-2 target nucleic acids are NOT DETECTED.  The SARS-CoV-2 RNA is generally detectable in upper respiratory specimens during the acute phase of infection. The lowest concentration of SARS-CoV-2 viral copies this assay can detect is 138 copies/mL. A negative result does not preclude SARS-Cov-2 infection and should not be used as the sole basis for treatment or other patient management decisions. A negative result may occur with  improper specimen  collection/handling, submission of specimen other than nasopharyngeal swab, presence of viral mutation(s) within the areas targeted by this assay, and inadequate number of viral copies(<138 copies/mL). A negative result must be combined with clinical observations, patient history, and epidemiological information. The expected result is Negative.  Fact Sheet for Patients:  bloggercourse.com  Fact Sheet for Healthcare Providers:  seriousbroker.it  This test is no t yet approved or cleared by the [place]  FDA and  has been authorized for detection and/or diagnosis of SARS-CoV-2 by FDA under an Emergency Use Authorization (EUA). This EUA will remain  in effect (meaning this test can be used) for the duration of the COVID-19 declaration under Section 564(b)(1) of the Act, 21 U.S.C.section 360bbb-3(b)(1), unless the authorization is terminated  or revoked sooner.       Influenza A by PCR NEGATIVE NEGATIVE Final   Influenza B by PCR NEGATIVE NEGATIVE Final    Comment: (NOTE) The Xpert Xpress SARS-CoV-2/FLU/RSV plus assay is intended as an aid in the diagnosis of influenza from Nasopharyngeal swab specimens and  should not be used as a sole basis for treatment. Nasal washings and aspirates are unacceptable for Xpert Xpress SARS-CoV-2/FLU/RSV testing.  Fact Sheet for Patients: bloggercourse.com  Fact Sheet for Healthcare Providers: seriousbroker.it  This test is not yet approved or cleared by the [place]  FDA and has been authorized for detection and/or diagnosis of SARS-CoV-2 by FDA under an Emergency Use Authorization (EUA). This EUA will remain in effect (meaning this test can be used) for the duration of the COVID-19 declaration under Section 564(b)(1) of the Act, 21 U.S.C. section 360bbb-3(b)(1), unless the authorization is terminated or revoked.  Performed at [facility], [address] [place], [place] [address]   CULTURE, BLOOD (ROUTINE X 2) w Reflex to ID Panel     Status: None   Collection Time: 08/09/20 11:48 AM   Specimen: BLOOD  Result Value Ref Range Status   Specimen Description BLOOD LEFT ARM  Final   Special Requests   Final    BOTTLES DRAWN AEROBIC AND ANAEROBIC Blood Culture results may not be optimal due to an inadequate volume of blood received in culture bottles   Culture   Final    NO GROWTH 5 DAYS Performed at [facility], [address] [place], [place] [address]    Report Status 08/14/2020 FINAL  Final  CULTURE, BLOOD (ROUTINE X 2) w Reflex to ID Panel     Status: None   Collection Time: 08/09/20  5:57 PM   Specimen: BLOOD  Result Value Ref Range Status   Specimen Description BLOOD LEFT ANTECUBITAL  Final   Special Requests   Final    BOTTLES DRAWN AEROBIC AND ANAEROBIC Blood Culture adequate volume   Culture   Final    NO GROWTH 5 DAYS Performed at [facility], [address] [place], [place] [address]    Report Status 08/14/2020 FINAL  Final    Labs: CBC: Recent Labs  Lab 02/10/23 0529 02/11/23 0434 02/12/23 0437 02/13/23 0605 02/14/23 0403  WBC 22.9* 17.1* 13.2* 11.8* 8.9  NEUTROABS 20.1*  --   --   --   --   HGB 18.4* 15.1 14.9 14.6 13.0  HCT 52.9* 42.7 42.9 42.0 38.2*  MCV 87.7 87.3 89.0 87.7 92.0  PLT 401* 311 275 278 252   Basic Metabolic Panel: Recent Labs  Lab 02/10/23 0529 02/11/23 0434 02/12/23 0437 02/13/23 0605  NA 129* 135 135 131*  K 3.5 3.5 3.6 3.8  CL 93* 99 101 99  CO2 9* 25 21* 22  GLUCOSE 160* 122* 93 103*  BUN 44* 35* 16 17  CREATININE 4.65* 1.64* 1.05 1.04  CALCIUM 9.6 8.5* 8.5* 8.8*   Liver Function Tests: Recent Labs  Lab 02/10/23 0529  AST 47*  ALT 24  ALKPHOS 71  BILITOT 0.9  PROT 8.9*  ALBUMIN 5.2*   CBG: Recent Labs  Lab 02/13/23 0122  GLUCAP 103*    Discharge time spent: greater than 30 minutes.  Signed: Charlie Patterson, MD [facility] 02/15/2023

## 2023-02-15 NOTE — Plan of Care (Signed)
  Problem: Education: Goal: Knowledge of General Education information will improve Description: Including pain rating scale, medication(s)/side effects and non-pharmacologic comfort measures Outcome: Progressing   Problem: Clinical Measurements: Goal: Ability to maintain clinical measurements within normal limits will improve Outcome: Progressing Goal: Will remain free from infection Outcome: Progressing Goal: Diagnostic test results will improve Outcome: Progressing Goal: Respiratory complications will improve Outcome: Progressing Goal: Cardiovascular complication will be avoided Outcome: Progressing   Problem: Pain Management: Goal: General experience of comfort will improve Outcome: Progressing   Problem: Skin Integrity: Goal: Risk for impaired skin integrity will decrease Outcome: Progressing

## 2023-02-15 NOTE — Progress Notes (Signed)
 Patient verbalized understanding of all discharge instructions, including follow up with PCP. Patient aware he has rx at pharmacy and needs to pick up. Pt declined need for w/c and requests to walk out, with friend instead.

## 2023-02-15 NOTE — Plan of Care (Signed)
